# Patient Record
Sex: Female | Born: 1990
Health system: Southern US, Community
[De-identification: ages and names within clinical notes are randomized; demographics above are authoritative.]

## PROBLEM LIST (undated history)

## (undated) DIAGNOSIS — I10 Essential (primary) hypertension: Secondary | ICD-10-CM

## (undated) DIAGNOSIS — Z789 Other specified health status: Secondary | ICD-10-CM

## (undated) DIAGNOSIS — E282 Polycystic ovarian syndrome: Secondary | ICD-10-CM

## (undated) DIAGNOSIS — E119 Type 2 diabetes mellitus without complications: Secondary | ICD-10-CM

## (undated) HISTORY — PX: NO PAST SURGERIES: SHX2092

---
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
## 2012-05-15 ENCOUNTER — Encounter (HOSPITAL_COMMUNITY): Payer: Self-pay | Admitting: *Deleted

## 2012-05-15 ENCOUNTER — Inpatient Hospital Stay (HOSPITAL_COMMUNITY)
Admission: AD | Admit: 2012-05-15 | Discharge: 2012-05-15 | Disposition: A | Discharge status: Home / Self Care | Payer: Self-pay | Source: Ambulatory Visit | Attending: Obstetrics & Gynecology | Admitting: Obstetrics & Gynecology

## 2012-05-15 DIAGNOSIS — Z3202 Encounter for pregnancy test, result negative: Secondary | ICD-10-CM | POA: Insufficient documentation

## 2012-05-15 HISTORY — DX: Other specified health status: Z78.9

## 2012-05-15 NOTE — MAU Note (Signed)
Pt LMP 03/29/2012, neg UPT at home.  Denies bleeding, discharge or pain.

## 2012-05-15 NOTE — MAU Provider Note (Signed)
Chart reviewed and agree with management and plan.  

## 2012-05-15 NOTE — MAU Provider Note (Signed)
  History     CSN: [id number]  Arrival date and time: 05/15/12 0646   None     Chief Complaint  Patient presents with  . Possible Pregnancy   HPI This is a 23 y.o. female who presents with request for pregnancy test, even though she just had a period less than a month ago. States she has been trying to get pregnant for 2 months and wants to know why she is not pregnant yet.  Denies any physical problems.  Has not seen a family doctor or OB/GYN.  Denies any history of GYN problems.   RN Note: Pt LMP 03/29/2012, neg UPT at home. Denies bleeding, discharge or pain.       OB History   Grav Para Term Preterm Abortions TAB SAB Ect Mult Living   1    1  1    0      Past Medical History  Diagnosis Date  . Medical history non-contributory     Past Surgical History  Procedure Laterality Date  . No past surgeries      Family History  Problem Relation Age of Onset  . Hypertension Father   . Diabetes Other     History  Substance Use Topics  . Smoking status: Never Smoker   . Smokeless tobacco: Not on file  . Alcohol Use: No    Allergies:  Allergies  Allergen Reactions  . Penicillins Rash    rash    Prescriptions prior to admission  Medication Sig Dispense Refill  . ibuprofen (ADVIL,MOTRIN) 200 MG tablet Take 200 mg by mouth every 6 (six) hours as needed for pain (pain).        Review of Systems  Constitutional: Negative for fever, chills and malaise/fatigue.  Gastrointestinal: Negative for nausea, vomiting, abdominal pain, diarrhea and constipation.  Genitourinary: Negative for dysuria.  Musculoskeletal: Negative for myalgias.  Neurological: Negative for dizziness and headaches.  Psychiatric/Behavioral: The patient is nervous/anxious.    Physical Exam   Blood pressure 122/65, pulse 88, temperature 97.8 F (36.6 C), temperature source Oral, resp. rate 16, height 5\' 4"  (1.626 m), weight 236 lb 3.2 oz (107.14 kg), last menstrual period 03/29/2012.  Physical Exam   Constitutional: She is oriented to person, place, and time. She appears well-developed and well-nourished. No distress.  Cardiovascular: Normal rate.   Respiratory: Effort normal.  Genitourinary:  Pelvic exam not indicated, has no complaints  Musculoskeletal: Normal range of motion.  Neurological: She is alert and oriented to person, place, and time.  Skin: Skin is warm and dry.  Psychiatric: She has a normal mood and affect.    MAU Course  Procedures  MDM Discussed conception chances and definition of infertility. Recommend she seek advice if she goes longer than one year without conception. Discussed need for basic GYN exam and recommended she start PNV before conception and optimize her physical health. [facility] physical exam. Too young for pap yet.   Assessment and Plan  A:  Young female with desire for pregnancy       No conception after 2 months of trying  P:  Recommend starting prenatal vitamins      Very brief preconception counseling done.       Recommend GYN physical and good general health practices   [facility] 05/15/2012, 8:24 AM

## 2012-08-02 ENCOUNTER — Emergency Department (HOSPITAL_COMMUNITY): Payer: Self-pay

## 2012-08-02 ENCOUNTER — Emergency Department (HOSPITAL_COMMUNITY)
Admission: EM | Admit: 2012-08-02 | Discharge: 2012-08-03 | Disposition: A | Discharge status: Home / Self Care | Payer: Self-pay | Attending: Emergency Medicine | Admitting: Emergency Medicine

## 2012-08-02 DIAGNOSIS — R220 Localized swelling, mass and lump, head: Secondary | ICD-10-CM | POA: Insufficient documentation

## 2012-08-02 DIAGNOSIS — R51 Headache: Secondary | ICD-10-CM | POA: Insufficient documentation

## 2012-08-02 DIAGNOSIS — R509 Fever, unspecified: Secondary | ICD-10-CM | POA: Insufficient documentation

## 2012-08-02 DIAGNOSIS — R1310 Dysphagia, unspecified: Secondary | ICD-10-CM | POA: Insufficient documentation

## 2012-08-02 DIAGNOSIS — J069 Acute upper respiratory infection, unspecified: Secondary | ICD-10-CM | POA: Insufficient documentation

## 2012-08-02 LAB — CBC WITH DIFFERENTIAL/PLATELET
Basophils Absolute: 0 10*3/uL (ref 0.0–0.1)
HCT: 40.2 % (ref 36.0–46.0)
Lymphocytes Relative: 16 % (ref 12–46)
Neutro Abs: 6.5 10*3/uL (ref 1.7–7.7)
Platelets: 275 10*3/uL (ref 150–400)
RDW: 11.8 % (ref 11.5–15.5)
WBC: 10 10*3/uL (ref 4.0–10.5)

## 2012-08-02 LAB — RAPID STREP SCREEN (MED CTR MEBANE ONLY): Streptococcus, Group A Screen (Direct): NEGATIVE

## 2012-08-02 LAB — BASIC METABOLIC PANEL
CO2: 24 mEq/L (ref 19–32)
Chloride: 97 mEq/L (ref 96–112)
Sodium: 134 mEq/L — ABNORMAL LOW (ref 135–145)

## 2012-08-02 MED ORDER — ACETAMINOPHEN 325 MG PO TABS
650.0000 mg | ORAL_TABLET | Freq: Once | ORAL | Status: AC
  Administered 2012-08-02: 650 mg via ORAL
  Filled 2012-08-02: qty 2

## 2012-08-02 MED ORDER — SODIUM CHLORIDE 0.9 % IV BOLUS (SEPSIS)
1000.0000 mL | Freq: Once | INTRAVENOUS | Status: AC
  Administered 2012-08-02: 1000 mL via INTRAVENOUS

## 2012-08-02 MED ORDER — IOHEXOL 300 MG/ML  SOLN
100.0000 mL | Freq: Once | INTRAMUSCULAR | Status: AC | PRN
  Administered 2012-08-02: 100 mL via INTRAVENOUS

## 2012-08-02 NOTE — ED Provider Notes (Signed)
History     This chart was scribed for non-physician practitioner Junious Silk PA-C working with Richardean Canal, MD by Smitty Pluck, ED scribe. This patient was seen in room WTR8/WTR8 and the patient's care was started at 10:47 PM.    CSN: [id number]  Arrival date & time 08/02/12  2137      Chief Complaint  Patient presents with  . Sore Throat  . Fever     The history is provided by the patient and medical records. No language interpreter was used.   HPI Comments: Laura Ashley is a 22 y.o. female who presents to the Emergency Department complaining of constant,moderate sore throat onset 2 days ago. Pt reports having fever (current temperature is 102.8 in ED). She states that she has trouble opening her mouth and pain with swallowing. Pt reports having swelling to the left side of neck. She mentions having moderate, frontal HA that is aggravated by looking up. Pt has taken tylenol with minor relief of HA. She denies sick contact.  Pt denies chills, nausea, vomiting, diarrhea, weakness, cough, SOB and any other pain.    Past Medical History  Diagnosis Date  . Medical history non-contributory     Past Surgical History  Procedure Laterality Date  . No past surgeries      Family History  Problem Relation Age of Onset  . Hypertension Father   . Diabetes Other     History  Substance Use Topics  . Smoking status: Never Smoker   . Smokeless tobacco: Not on file  . Alcohol Use: No    OB History   Grav Para Term Preterm Abortions TAB SAB Ect Mult Living   1    1  1    0      Review of Systems  Constitutional: Positive for fever. Negative for chills.  HENT: Positive for sore throat.   Respiratory: Negative for shortness of breath.   Gastrointestinal: Negative for nausea and vomiting.  Neurological: Positive for headaches. Negative for weakness.  All other systems reviewed and are negative.    Allergies  Penicillins  Home Medications  No current outpatient  prescriptions on file.  BP 133/87  Pulse 129  Temp(Src) 102.8 F (39.3 C) (Oral)  Resp 16  SpO2 100%  Physical Exam  Nursing note and vitals reviewed. Constitutional: She is oriented to person, place, and time. She appears well-developed and well-nourished. No distress.  HENT:  Head: Normocephalic and atraumatic.  Right Ear: External ear normal.  Left Ear: External ear normal.  Nose: Nose normal.  Mouth/Throat: Oropharynx is clear and moist.  Tenderness to palpation in frontal and maxillary sinuses No submandibular edema  Mild trismus No tongue elevation   Eyes: Conjunctivae are normal.  Neck: Normal range of motion.  Tender lymphadenopathy in left submandibular   Cardiovascular: Normal rate, regular rhythm and normal heart sounds.   Pulmonary/Chest: Effort normal and breath sounds normal. No stridor. No respiratory distress. She has no wheezes. She has no rales.  Abdominal: Soft. She exhibits no distension.  Musculoskeletal: Normal range of motion.  Neurological: She is alert and oriented to person, place, and time. She has normal strength.  Skin: Skin is warm and dry. She is not diaphoretic. No erythema.  Psychiatric: She has a normal mood and affect. Her behavior is normal.    ED Course  Procedures (including critical care time) DIAGNOSTIC STUDIES: Oxygen Saturation is 100% on room air, normal by my interpretation.    COORDINATION OF CARE: 10:49 PM  Discussed ED treatment with pt and pt agrees.  Medications  sodium chloride 0.9 % bolus 1,000 mL (1,000 mLs Intravenous New Bag/Given 08/02/12 2324)  iohexol (OMNIPAQUE) 300 MG/ML solution 100 mL (not administered)  acetaminophen (TYLENOL) tablet 650 mg (650 mg Oral Given 08/02/12 2230)       Labs Reviewed  CBC WITH DIFFERENTIAL - Abnormal; Notable for the following:    MCHC 36.1 (*)    Monocytes Relative 19 (*)    Monocytes Absolute 1.9 (*)    All other components within normal limits  BASIC METABOLIC PANEL -  Abnormal; Notable for the following:    Sodium 134 (*)    All other components within normal limits  RAPID STREP SCREEN  CULTURE, GROUP A STREP   Ct Soft Tissue Neck W Contrast  08/03/2012   *RADIOLOGY REPORT*  Clinical Data: Neck pain.  Fever.  Left neck swelling and jaw pain.  CT NECK WITH CONTRAST  Technique:  Multidetector CT imaging of the neck was performed with intravenous contrast.  Contrast: OMNIPAQUE IOHEXOL 300 MG/ML  SOLN  Comparison: None.  Findings: Symmetric enlargement of the palatine tonsils is seen bilaterally.  No evidence of tonsillar or peritonsillar abscess.  Bilateral submandibular lymphadenopathy is seen, left side greater than right, with largest left submandibular node measuring 1.9 cm. Bilateral jugulodigastric lymphadenopathy is also seen, with largest lymph node on the left side measuring 1.8 cm.  Mild bilateral upper jugular chain lymphadenopathy also seen which is relatively symmetric. No necrotic lymphadenopathy is seen.  No soft tissue masses are seen involving the salivary glands or thyroid.  Larynx and epiglottis are normal in appearance.  IMPRESSION:  1.  Bilateral symmetric enlargement of palatine tonsils, consistent with tonsillitis.  No evidence of tonsillar or peritonsillar abscess. 2.  Bilateral submandibular, jugulodigastric, and upper jugular chain lymphadenopathy, likely reactive in etiology.   Original Report Authenticated By: Myles Rosenthal, M.D.     1. URI (upper respiratory infection)       MDM  Patient is a 22 year old female presents today with 2 days of worsening sore throat and fever. Fever as high as 103F. Patient tenderness to the left submandibular side. CT scan shows no evidence of peritonsillar abscess. She was given 1 L of normal saline in the ED. Heart rate was measured at 109 by me before discharge. Fever was reduced to 101.66F. Discussed with patient that this is viral in nature. Drink plenty of fluids, symptomatic therapy. Followup  with your primary care doctor. Return instructions given. Vital signs stable for discharge. Patient / Family / Caregiver informed of clinical course, understand medical decision-making process, and agree with plan.       I personally performed the services described in this documentation, which was scribed in my presence. The recorded information has been reviewed and is accurate.     Mora Bellman, PA-C 08/03/12 [phone]

## 2012-08-02 NOTE — ED Notes (Signed)
Pt c/o swelling to L side of neck with sore throat, headache and lightheaded for past 2 days. Worse now. Pt has obvious swelling to L side of neck. No acute distress. Breaths even/unlabored. Pt states her family drove her here. Pt ambulatory to exam room with steady gait.

## 2012-08-03 MED ORDER — FLUTICASONE PROPIONATE 50 MCG/ACT NA SUSP: 2.0000 | Freq: Every day | NASAL | Status: DC

## 2012-08-03 MED ORDER — ACETAMINOPHEN 500 MG PO TABS
1000.0000 mg | ORAL_TABLET | Freq: Once | ORAL | Status: AC
  Administered 2012-08-03: 1000 mg via ORAL
  Filled 2012-08-03: qty 2

## 2012-08-03 NOTE — ED Notes (Signed)
Pt ambulating independently w/ steady gait on d/c in no acute distress, A&Ox4. D/c instructions reviewed w/ pt and family - pt and family deny any further questions or concerns at present. Rx given x1  

## 2012-08-04 LAB — CULTURE, GROUP A STREP

## 2012-08-06 ENCOUNTER — Telehealth (HOSPITAL_COMMUNITY): Payer: Self-pay | Admitting: Emergency Medicine

## 2012-08-06 NOTE — ED Notes (Signed)
Post ED Visit - Positive Culture Follow-up  Culture report reviewed by antimicrobial stewardship pharmacist: []  Wes Dulaney, Pharm.D., BCPS [x]  Celedonio Miyamoto, Pharm.D., BCPS []  Georgina Pillion, Pharm.D., BCPS []  [place], [address].D., BCPS, AAHIVP []  Estella Husk, Pharm.D., BCPS, AAHIV  Positive throat culture No treatment needed.  Kylie A Holland 08/06/2012, 11:10 AM

## 2012-08-09 NOTE — ED Provider Notes (Signed)
Medical screening examination/treatment/procedure(s) were performed by non-physician practitioner and as supervising physician I was immediately available for consultation/collaboration.   Richardean Canal, MD 08/09/12 [phone]

## 2012-08-28 ENCOUNTER — Emergency Department (HOSPITAL_COMMUNITY)
Admission: EM | Admit: 2012-08-28 | Discharge: 2012-08-28 | Disposition: A | Discharge status: Home / Self Care | Payer: Self-pay | Attending: Emergency Medicine | Admitting: Emergency Medicine

## 2012-08-28 ENCOUNTER — Encounter (HOSPITAL_COMMUNITY): Payer: Self-pay | Admitting: Emergency Medicine

## 2012-08-28 DIAGNOSIS — R599 Enlarged lymph nodes, unspecified: Secondary | ICD-10-CM | POA: Insufficient documentation

## 2012-08-28 DIAGNOSIS — Z880 Allergy status to penicillin: Secondary | ICD-10-CM | POA: Insufficient documentation

## 2012-08-28 DIAGNOSIS — R590 Localized enlarged lymph nodes: Secondary | ICD-10-CM

## 2012-08-28 DIAGNOSIS — Z789 Other specified health status: Secondary | ICD-10-CM | POA: Insufficient documentation

## 2012-08-28 NOTE — ED Notes (Signed)
Pt reports being seen on 08/02/2012 for upper respiratory infection and swollen glands. Pt reports that her swollen glands has improved since last being seen. Pt also states she had left sided ear pain.

## 2012-08-28 NOTE — ED Provider Notes (Signed)
History  This chart was scribed for Hayleigh Bawa working with Ethelda Chick, MD by Ardelia Mems, ED Scribe. This patient was seen in room WTR7/WTR7 and the patient's care was started at 5:30 PM.   CSN: [id number]  Arrival date & time 08/28/12  1712    Chief Complaint  Patient presents with  . Adenopathy     The history is provided by the patient. No language interpreter was used.   HPI Comments: Laura Ashley is a 22 y.o. female who presents to the Emergency Department complaining of left-sided neck gland swelling. Pt states that she was diagnosed with tonsillitis 1 month ago. Pt had a CT of her neck on 08/03/12 and was found to have lymphadenopathy. Pt states that her left tonsil has remained swollen for a month despite antibiotics and steroids. Pt also complains of constant, mild right ear pain onset about a month ago. Pt states that turning her head left worsens pain. Pt denies fever, difficulty swallowing, or any other symptoms. Pt is a former occasional smoker.  PCP- none Past Medical History  Diagnosis Date  . Medical history non-contributory     Past Surgical History  Procedure Laterality Date  . No past surgeries      Family History  Problem Relation Age of Onset  . Hypertension Father   . Diabetes Other     History  Substance Use Topics  . Smoking status: Never Smoker   . Smokeless tobacco: Not on file  . Alcohol Use: No    OB History   Grav Para Term Preterm Abortions TAB SAB Ect Mult Living   1    1  1    0      Review of Systems As per HPI   Allergies  Penicillins  Home Medications   Current Outpatient Rx  Name  Route  Sig  Dispense  Refill  . fluticasone (FLONASE) 50 MCG/ACT nasal spray   Nasal   Place 2 sprays into the nose daily.   16 g   0     Triage Vitals: BP 134/82  Pulse 89  Temp(Src) 99 F (37.2 C) (Oral)  Resp 20  SpO2 100%  LMP 06/27/2012  Physical Exam  Constitutional: She is oriented to person, place, and time. She  appears well-developed and well-nourished. No distress.  HENT:  Head: Normocephalic and atraumatic.  Mouth/Throat: Oropharynx is clear and moist. No oropharyngeal exudate.  Eyes: Conjunctivae and EOM are normal. Pupils are equal, round, and reactive to light. No scleral icterus.  Neck: Normal range of motion. Neck supple. No tracheal deviation present. No thyromegaly present.  Cardiovascular: Normal rate, regular rhythm, normal heart sounds and intact distal pulses.   Pulmonary/Chest: Effort normal and breath sounds normal. No stridor. No respiratory distress. She has no wheezes.  Abdominal: Soft.  Musculoskeletal: Normal range of motion. She exhibits no edema and no tenderness.  Lymphadenopathy:    She has cervical adenopathy.  Neurological: She is alert and oriented to person, place, and time. Coordination normal.  Skin: Skin is warm and dry. No rash noted. She is not diaphoretic. No erythema. No pallor.  Psychiatric: She has a normal mood and affect. Her behavior is normal.    ED Course  Procedures (including critical care time)  DIAGNOSTIC STUDIES: Oxygen Saturation is 100% on RA, normal by my interpretation.    COORDINATION OF CARE: 5:34 PM- Pt advised of plan for treatment and pt agrees.     Labs Reviewed - No data to display No  results found.   1. Cervical lymphadenopathy       MDM  Non-toxic, non-septic appearing 22 y.o. Female presents to ED complaining of adenopathy. Pt asymptomatic, afebrile, with no other acute complaints. Discussed that this is likely due to her tonsillitis a couple weeks ago. Explained that this should resolve on its own and advised primary care follow up if it does not. Pt given affordable care act information and resource guide.  MDM Number of Diagnoses or Management Options        I personally performed the services described in this documentation, which was scribed in my presence. The recorded information has been reviewed and is  accurate.      Jaci Carrel, [place] 08/29/12 0104

## 2012-09-01 NOTE — ED Provider Notes (Signed)
Medical screening examination/treatment/procedure(s) were performed by non-physician practitioner and as supervising physician I was immediately available for consultation/collaboration.  Charletha Dalpe K Linker, MD 09/01/12 1518 

## 2012-11-10 ENCOUNTER — Encounter (HOSPITAL_COMMUNITY): Payer: Self-pay | Admitting: Emergency Medicine

## 2012-11-10 ENCOUNTER — Emergency Department (HOSPITAL_COMMUNITY)
Admission: EM | Admit: 2012-11-10 | Discharge: 2012-11-10 | Disposition: A | Discharge status: Home / Self Care | Payer: Self-pay | Attending: Emergency Medicine | Admitting: Emergency Medicine

## 2012-11-10 DIAGNOSIS — Z880 Allergy status to penicillin: Secondary | ICD-10-CM | POA: Insufficient documentation

## 2012-11-10 DIAGNOSIS — R3915 Urgency of urination: Secondary | ICD-10-CM | POA: Insufficient documentation

## 2012-11-10 DIAGNOSIS — Z3202 Encounter for pregnancy test, result negative: Secondary | ICD-10-CM | POA: Insufficient documentation

## 2012-11-10 DIAGNOSIS — Y9389 Activity, other specified: Secondary | ICD-10-CM | POA: Insufficient documentation

## 2012-11-10 DIAGNOSIS — S335XXA Sprain of ligaments of lumbar spine, initial encounter: Secondary | ICD-10-CM | POA: Insufficient documentation

## 2012-11-10 DIAGNOSIS — Z79899 Other long term (current) drug therapy: Secondary | ICD-10-CM | POA: Insufficient documentation

## 2012-11-10 DIAGNOSIS — R350 Frequency of micturition: Secondary | ICD-10-CM | POA: Insufficient documentation

## 2012-11-10 DIAGNOSIS — S39012A Strain of muscle, fascia and tendon of lower back, initial encounter: Secondary | ICD-10-CM

## 2012-11-10 DIAGNOSIS — N390 Urinary tract infection, site not specified: Secondary | ICD-10-CM | POA: Insufficient documentation

## 2012-11-10 DIAGNOSIS — Z792 Long term (current) use of antibiotics: Secondary | ICD-10-CM | POA: Insufficient documentation

## 2012-11-10 DIAGNOSIS — Y990 Civilian activity done for income or pay: Secondary | ICD-10-CM | POA: Insufficient documentation

## 2012-11-10 DIAGNOSIS — Y929 Unspecified place or not applicable: Secondary | ICD-10-CM | POA: Insufficient documentation

## 2012-11-10 DIAGNOSIS — X503XXA Overexertion from repetitive movements, initial encounter: Secondary | ICD-10-CM | POA: Insufficient documentation

## 2012-11-10 DIAGNOSIS — R109 Unspecified abdominal pain: Secondary | ICD-10-CM | POA: Insufficient documentation

## 2012-11-10 LAB — URINALYSIS, ROUTINE W REFLEX MICROSCOPIC
Ketones, ur: NEGATIVE mg/dL
Nitrite: NEGATIVE
Protein, ur: NEGATIVE mg/dL

## 2012-11-10 LAB — URINE MICROSCOPIC-ADD ON

## 2012-11-10 MED ORDER — IBUPROFEN 200 MG PO TABS
600.0000 mg | ORAL_TABLET | Freq: Once | ORAL | Status: AC
  Administered 2012-11-10: 600 mg via ORAL
  Filled 2012-11-10: qty 3

## 2012-11-10 MED ORDER — IBUPROFEN 600 MG PO TABS: 600.0000 mg | ORAL_TABLET | Freq: Four times a day (QID) | ORAL | Status: DC | PRN

## 2012-11-10 MED ORDER — METHOCARBAMOL 500 MG PO TABS: 500.0000 mg | ORAL_TABLET | Freq: Two times a day (BID) | ORAL | Status: DC

## 2012-11-10 MED ORDER — SULFAMETHOXAZOLE-TRIMETHOPRIM 800-160 MG PO TABS: 1.0000 | ORAL_TABLET | Freq: Two times a day (BID) | ORAL | Status: DC

## 2012-11-10 NOTE — Progress Notes (Signed)
[facility] provided pt with a list of primary care resources in [place].

## 2012-11-10 NOTE — ED Notes (Signed)
Pt reports having mid-lower 9/10 lower back pain that started last Saturday, which progressively has gotten worse. Pt also reports generalized lower abdominal pain. Pt reports nausea, however denies diarrhea or emesis. Pt reports her last period was two months ago, which was irregular. Pt is A/O x4 and in NAD.

## 2012-11-10 NOTE — ED Provider Notes (Signed)
CSN: [id number]     Arrival date & time 11/10/12  1115 History   First MD Initiated Contact with Patient 11/10/12 1118     Chief Complaint  Patient presents with  . Back Pain  . Abdominal Pain   (Consider location/radiation/quality/duration/timing/severity/associated sxs/prior Treatment) HPI Patient is a 22 year old female who presents with 6 days of mid thoracic and lumbar pain worse with movement. Patient states the pain radiates bilaterally to her abdomen. No radiation down her legs. No bladder or bowel incontinence. No weakness or numbness in her extremities. 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 No known trauma. No fevers or chills. Patient complains of urinary frequency and urgency. No hematuria. No nausea, vomiting, diarrhea or constipation. The patient denies rash bleeding or discharge. States she last had her period in June. She is sexually active. Past Medical History  Diagnosis Date  . Medical history non-contributory    Past Surgical History  Procedure Laterality Date  . No past surgeries     Family History  Problem Relation Age of Onset  . Hypertension Father   . Diabetes Other    History  Substance Use Topics  . Smoking status: Never Smoker   . Smokeless tobacco: Never Used  . Alcohol Use: No   OB History   Grav Para Term Preterm Abortions TAB SAB Ect Mult Living   1    1  1    0     Review of Systems  Constitutional: Negative for fever and chills.  HENT: Negative for neck pain.   Respiratory: Negative for cough and shortness of breath.   Cardiovascular: Negative for chest pain, palpitations and leg swelling.  Gastrointestinal: Positive for abdominal pain. Negative for nausea, vomiting, diarrhea, constipation and blood in stool.  Genitourinary: Positive for frequency. Negative for dysuria, hematuria, flank pain, vaginal bleeding, vaginal discharge, difficulty urinating and pelvic pain.  Musculoskeletal: Positive for myalgias and  back pain. Negative for arthralgias.  Skin: Negative for rash and wound.  Neurological: Negative for dizziness, weakness, light-headedness, numbness and headaches.  All other systems reviewed and are negative.    Allergies  Penicillins  Home Medications   Current Outpatient Rx  Name  Route  Sig  Dispense  Refill  . ibuprofen (ADVIL,MOTRIN) 600 MG tablet   Oral   Take 1 tablet (600 mg total) by mouth every 6 (six) hours as needed for pain.   30 tablet   0   . methocarbamol (ROBAXIN) 500 MG tablet   Oral   Take 1 tablet (500 mg total) by mouth 2 (two) times daily.   20 tablet   0   . sulfamethoxazole-trimethoprim (SEPTRA DS) 800-160 MG per tablet   Oral   Take 1 tablet by mouth 2 (two) times daily.   10 tablet   0    BP 152/92  Pulse 83  Temp(Src) 98.9 F (37.2 C) (Oral)  Resp 20  SpO2 100%  LMP 10/10/2012 Physical Exam  Nursing note and vitals reviewed. Constitutional: She is oriented to person, place, and time. She appears well-developed and well-nourished. No distress.  HENT:  Head: Normocephalic and atraumatic.  Mouth/Throat: Oropharynx is clear and moist.  Eyes: EOM are normal. Pupils are equal, round, and reactive to light.  Neck: Normal range of motion. Neck supple.  Cardiovascular: Normal rate and regular rhythm.   Pulmonary/Chest: Effort normal and breath sounds normal. No respiratory distress. She has no wheezes. She has no rales.  Abdominal: Soft.  Bowel sounds are normal.  Musculoskeletal: Normal range of motion. She exhibits no edema and no tenderness.  No CVA tenderness. Patient has low thoracic and lumbar paraspinal tenderness bilaterally. No midline thoracic or lumbar tenderness. No step off or deformities. Patient has negative straight leg raise bilaterally. her distal pulses are intact.  Neurological: She is alert and oriented to person, place, and time.  Patient is alert and oriented x3 with clear, goal oriented speech. Patient has 5/5 motor in  all extremities. Sensation is intact to light touch. Patient has a normal gait and walks without assistance.   Skin: Skin is warm and dry. No rash noted. No erythema.  Psychiatric: She has a normal mood and affect. Her behavior is normal.    ED Course  Procedures (including critical care time) Labs Review Labs Reviewed  URINALYSIS, ROUTINE W REFLEX MICROSCOPIC - Abnormal; Notable for the following:    APPearance CLOUDY (*)    Hgb urine dipstick MODERATE (*)    Leukocytes, UA LARGE (*)    All other components within normal limits  URINE MICROSCOPIC-ADD ON - Abnormal; Notable for the following:    Squamous Epithelial / LPF FEW (*)    Bacteria, UA MANY (*)    All other components within normal limits  URINE CULTURE  POCT PREGNANCY, URINE   Imaging Review No results found.  MDM   Given normal heart rate, lack of fever, no costovertebral angle tenderness, but do not believe the patient has pyelonephritis. The patient likely has lumbar strain and a urinary tract infection.  Return Precautions given.  Loren Racer, MD 11/10/12 1227

## 2012-11-12 LAB — URINE CULTURE: Colony Count: >=100000

## 2012-11-13 NOTE — ED Notes (Signed)
+   Urine Treated with Bactrim -Appropriate treatment per protocol MD.

## 2012-11-16 ENCOUNTER — Encounter (HOSPITAL_COMMUNITY): Payer: Self-pay | Admitting: Emergency Medicine

## 2012-11-16 ENCOUNTER — Emergency Department (HOSPITAL_COMMUNITY)
Admission: EM | Admit: 2012-11-16 | Discharge: 2012-11-17 | Disposition: A | Discharge status: Home / Self Care | Payer: Self-pay | Attending: Emergency Medicine | Admitting: Emergency Medicine

## 2012-11-16 DIAGNOSIS — Z3202 Encounter for pregnancy test, result negative: Secondary | ICD-10-CM | POA: Insufficient documentation

## 2012-11-16 DIAGNOSIS — Z79899 Other long term (current) drug therapy: Secondary | ICD-10-CM | POA: Insufficient documentation

## 2012-11-16 DIAGNOSIS — R109 Unspecified abdominal pain: Secondary | ICD-10-CM | POA: Insufficient documentation

## 2012-11-16 DIAGNOSIS — Z880 Allergy status to penicillin: Secondary | ICD-10-CM | POA: Insufficient documentation

## 2012-11-16 DIAGNOSIS — Z87440 Personal history of urinary (tract) infections: Secondary | ICD-10-CM | POA: Insufficient documentation

## 2012-11-16 LAB — COMPREHENSIVE METABOLIC PANEL
Albumin: 3.7 g/dL (ref 3.5–5.2)
Alkaline Phosphatase: 64 U/L (ref 39–117)
BUN: 10 mg/dL (ref 6–23)
CO2: 25 mEq/L (ref 19–32)
Chloride: 105 mEq/L (ref 96–112)
GFR calc Af Amer: >90 mL/min (ref >90)
GFR calc non Af Amer: >90 mL/min (ref >90)
Glucose, Bld: 100 mg/dL — ABNORMAL HIGH (ref 70–99)
Potassium: 3.9 mEq/L (ref 3.5–5.1)
Total Bilirubin: 0.3 mg/dL (ref 0.3–1.2)

## 2012-11-16 LAB — CBC WITH DIFFERENTIAL/PLATELET
HCT: 40.3 % (ref 36.0–46.0)
Hemoglobin: 14.2 g/dL (ref 12.0–15.0)
Lymphocytes Relative: 40 % (ref 12–46)
Lymphs Abs: 2.6 10*3/uL (ref 0.7–4.0)
Monocytes Relative: 12 % (ref 3–12)
Neutro Abs: 2.9 10*3/uL (ref 1.7–7.7)
Neutrophils Relative %: 45 % (ref 43–77)
RBC: 4.66 MIL/uL (ref 3.87–5.11)

## 2012-11-16 LAB — URINALYSIS, ROUTINE W REFLEX MICROSCOPIC
Bilirubin Urine: NEGATIVE
Ketones, ur: NEGATIVE mg/dL
Nitrite: NEGATIVE
pH: 6 (ref 5.0–8.0)

## 2012-11-16 LAB — LIPASE, BLOOD: Lipase: 24 U/L (ref 11–59)

## 2012-11-16 NOTE — ED Notes (Signed)
Pt was seen here for back pain, given ABX for UTI, pt now stating cramping abdominal pain after beginning ABX. Pt reports nausea, and diarrhea, no vomiting.

## 2012-11-17 NOTE — ED Provider Notes (Signed)
CSN: [id number]     Arrival date & time 11/16/12  2218 History   First MD Initiated Contact with Patient 11/17/12 0030     Chief Complaint  Patient presents with  . Abdominal Pain   (Consider location/radiation/quality/duration/timing/severity/associated sxs/prior Treatment) HPI Comments: Patient presents to the emergency department with chief complaints of abdominal pain. She states that she was seen recently and treated for UTI. She states that she has had some abdominal cramping following taking her antibiotics. She states that she is mostly curious to note her labs are better. She states that she has had some nausea, but denies diarrhea, constipation, or vomiting. She denies fevers or chills. She states that she is no longer having any abdominal discomfort, and asks to go home.  The history is provided by the patient. No language interpreter was used.    Past Medical History  Diagnosis Date  . Medical history non-contributory    Past Surgical History  Procedure Laterality Date  . No past surgeries     Family History  Problem Relation Age of Onset  . Hypertension Father   . Diabetes Other    History  Substance Use Topics  . Smoking status: Never Smoker   . Smokeless tobacco: Never Used  . Alcohol Use: No   OB History   Grav Para Term Preterm Abortions TAB SAB Ect Mult Living   1    1  1    0     Review of Systems  All other systems reviewed and are negative.    Allergies  Penicillins  Home Medications   Current Outpatient Rx  Name  Route  Sig  Dispense  Refill  . ibuprofen (ADVIL,MOTRIN) 600 MG tablet   Oral   Take 1 tablet (600 mg total) by mouth every 6 (six) hours as needed for pain.   30 tablet   0   . sulfamethoxazole-trimethoprim (SEPTRA DS) 800-160 MG per tablet   Oral   Take 1 tablet by mouth 2 (two) times daily.   10 tablet   0   . methocarbamol (ROBAXIN) 500 MG tablet   Oral   Take 1 tablet (500 mg total) by mouth 2 (two) times daily.   20  tablet   0    BP 146/84  Pulse 86  Temp(Src) 98.8 F (37.1 C) (Oral)  Resp 18  SpO2 99%  LMP 10/10/2012 Physical Exam  Nursing note and vitals reviewed. Constitutional: She is oriented to person, place, and time. She appears well-developed and well-nourished.  HENT:  Head: Normocephalic and atraumatic.  Eyes: Conjunctivae and EOM are normal. Pupils are equal, round, and reactive to light.  Neck: Normal range of motion. Neck supple.  Cardiovascular: Normal rate and regular rhythm.  Exam reveals no gallop and no friction rub.   No murmur heard. Pulmonary/Chest: Effort normal and breath sounds normal. No respiratory distress. She has no wheezes. She has no rales. She exhibits no tenderness.  Abdominal: Soft. Bowel sounds are normal. She exhibits no distension and no mass. There is no tenderness. There is no rebound and no guarding.  No focal abdominal tenderness, no fluid wave, no signs of peritonitis, no signs of surgical abdomen  Musculoskeletal: Normal range of motion. She exhibits no edema and no tenderness.  Neurological: She is alert and oriented to person, place, and time.  Skin: Skin is warm and dry.  Psychiatric: She has a normal mood and affect. Her behavior is normal. Judgment and thought content normal.    ED  Course  Procedures (including critical care time) Labs Review Labs Reviewed  COMPREHENSIVE METABOLIC PANEL - Abnormal; Notable for the following:    Glucose, Bld 100 (*)    All other components within normal limits  URINALYSIS, ROUTINE W REFLEX MICROSCOPIC - Abnormal; Notable for the following:    APPearance CLOUDY (*)    Specific Gravity, Urine 1.032 (*)    All other components within normal limits  CBC WITH DIFFERENTIAL  LIPASE, BLOOD  POCT PREGNANCY, URINE   Results for orders placed during the hospital encounter of 11/16/12  CBC WITH DIFFERENTIAL      Result Value Range   WBC 6.5  4.0 - 10.5 K/uL   RBC 4.66  3.87 - 5.11 MIL/uL   Hemoglobin 14.2  12.0  - 15.0 g/dL   HCT 41.3  24.4 - 01.0 %   MCV 86.5  78.0 - 100.0 fL   MCH 30.5  26.0 - 34.0 pg   MCHC 35.2  30.0 - 36.0 g/dL   RDW 27.2  53.6 - 64.4 %   Platelets 357  150 - 400 K/uL   Neutrophils Relative % 45  43 - 77 %   Neutro Abs 2.9  1.7 - 7.7 K/uL   Lymphocytes Relative 40  12 - 46 %   Lymphs Abs 2.6  0.7 - 4.0 K/uL   Monocytes Relative 12  3 - 12 %   Monocytes Absolute 0.8  0.1 - 1.0 K/uL   Eosinophils Relative 3  0 - 5 %   Eosinophils Absolute 0.2  0.0 - 0.7 K/uL   Basophils Relative 1  0 - 1 %   Basophils Absolute 0.0  0.0 - 0.1 K/uL  COMPREHENSIVE METABOLIC PANEL      Result Value Range   Sodium 141  135 - 145 mEq/L   Potassium 3.9  3.5 - 5.1 mEq/L   Chloride 105  96 - 112 mEq/L   CO2 25  19 - 32 mEq/L   Glucose, Bld 100 (*) 70 - 99 mg/dL   BUN 10  6 - 23 mg/dL   Creatinine, Ser 0.34  0.50 - 1.10 mg/dL   Calcium 9.6  8.4 - 74.2 mg/dL   Total Protein 7.5  6.0 - 8.3 g/dL   Albumin 3.7  3.5 - 5.2 g/dL   AST 21  0 - 37 U/L   ALT 20  0 - 35 U/L   Alkaline Phosphatase 64  39 - 117 U/L   Total Bilirubin 0.3  0.3 - 1.2 mg/dL   GFR calc non Af Amer >90  >90 mL/min   GFR calc Af Amer >90  >90 mL/min  LIPASE, BLOOD      Result Value Range   Lipase 24  11 - 59 U/L  URINALYSIS, ROUTINE W REFLEX MICROSCOPIC      Result Value Range   Color, Urine YELLOW  YELLOW   APPearance CLOUDY (*) CLEAR   Specific Gravity, Urine 1.032 (*) 1.005 - 1.030   pH 6.0  5.0 - 8.0   Glucose, UA NEGATIVE  NEGATIVE mg/dL   Hgb urine dipstick NEGATIVE  NEGATIVE   Bilirubin Urine NEGATIVE  NEGATIVE   Ketones, ur NEGATIVE  NEGATIVE mg/dL   Protein, ur NEGATIVE  NEGATIVE mg/dL   Urobilinogen, UA 1.0  0.0 - 1.0 mg/dL   Nitrite NEGATIVE  NEGATIVE   Leukocytes, UA NEGATIVE  NEGATIVE  POCT PREGNANCY, URINE      Result Value Range   Preg Test, Ur NEGATIVE  NEGATIVE   No results found.    MDM   1. Abdominal  pain, other specified site    Patient looks very well. She is mostly concerned about  knowing if her UTI has resolved. She states that she thinks that her antibiotics and made her stomach sick. She states that she is feeling much better now. She is not complaining of anything at this time. She is requesting something to drink, and wants to be discharged. I've given specific return precautions. Do not feel that any further workup is indicated at this time. Patient understands and agrees to plan. She is stable and ready for discharge.    Roxy Horseman, PA-C 11/17/12 0111

## 2012-11-20 NOTE — ED Provider Notes (Signed)
Medical screening examination/treatment/procedure(s) were performed by non-physician practitioner and as supervising physician I was immediately available for consultation/collaboration.   Kiera Hussey Joseph Apphia Cropley, MD 11/20/12 0736 

## 2013-03-10 ENCOUNTER — Inpatient Hospital Stay (HOSPITAL_COMMUNITY)
Admission: AD | Admit: 2013-03-10 | Discharge: 2013-03-10 | Disposition: A | Discharge status: Home / Self Care | Payer: Self-pay | Source: Ambulatory Visit | Attending: Obstetrics & Gynecology | Admitting: Obstetrics & Gynecology

## 2013-03-10 ENCOUNTER — Encounter (HOSPITAL_COMMUNITY): Payer: Self-pay | Admitting: *Deleted

## 2013-03-10 DIAGNOSIS — Z3202 Encounter for pregnancy test, result negative: Secondary | ICD-10-CM | POA: Insufficient documentation

## 2013-03-10 DIAGNOSIS — R1011 Right upper quadrant pain: Secondary | ICD-10-CM | POA: Insufficient documentation

## 2013-03-10 LAB — CBC
MCH: 30.5 pg (ref 26.0–34.0)
MCHC: 35.6 g/dL (ref 30.0–36.0)
Platelets: 362 10*3/uL (ref 150–400)
RBC: 4.36 MIL/uL (ref 3.87–5.11)
RDW: 11.9 % (ref 11.5–15.5)

## 2013-03-10 LAB — URINALYSIS, ROUTINE W REFLEX MICROSCOPIC
Bilirubin Urine: NEGATIVE
Glucose, UA: NEGATIVE mg/dL
Ketones, ur: NEGATIVE mg/dL
Leukocytes, UA: NEGATIVE
Nitrite: NEGATIVE
Protein, ur: NEGATIVE mg/dL
Specific Gravity, Urine: 1.015 (ref 1.005–1.030)
Urobilinogen, UA: 0.2 mg/dL (ref 0.0–1.0)
pH: 7 (ref 5.0–8.0)

## 2013-03-10 LAB — POCT PREGNANCY, URINE: Preg Test, Ur: NEGATIVE

## 2013-03-10 LAB — URINE MICROSCOPIC-ADD ON

## 2013-03-10 NOTE — Progress Notes (Signed)
Jennifer Rasch NP in earlier to discuss test results and d/c plan. Written and verbal d/c instructions given and understanding voiced. 

## 2013-03-10 NOTE — MAU Provider Note (Signed)
History     CSN: [id number]  Arrival date and time: 03/10/13 1708   First Provider Initiated Contact with Patient 03/10/13 1909      Chief Complaint  Patient presents with  . Abdominal Pain   HPI  [name] is a 22 y.o. female G1P0010 who presents for pregnancy confirmation and abdominal pain. She had a positive pregnancy test at home two weeks ago and a second positive test yesterday. She started experiencing abdominal pain today and that brought her to the ER.  The pain is on the right side of her upper abdomen.  The pain came on all of a sudden. She currently denies pain and rates it 0/10. Pregnancy test in MAU was negative; beta hcg pending.    OB History   Grav Para Term Preterm Abortions TAB SAB Ect Mult Living   1    1  1    0      Past Medical History  Diagnosis Date  . Medical history non-contributory     Past Surgical History  Procedure Laterality Date  . No past surgeries      Family History  Problem Relation Age of Onset  . Hypertension Father   . Diabetes Other     History  Substance Use Topics  . Smoking status: Never Smoker   . Smokeless tobacco: Never Used  . Alcohol Use: No    Allergies:  Allergies  Allergen Reactions  . Penicillins Rash    rash    No prescriptions prior to admission   Results for orders placed during the hospital encounter of 03/10/13 (from the past 24 hour(s))  URINALYSIS, ROUTINE W REFLEX MICROSCOPIC     Status: Abnormal   Collection Time    03/10/13  5:26 PM      Result Value Range   Color, Urine YELLOW  YELLOW   APPearance CLEAR  CLEAR   Specific Gravity, Urine 1.015  1.005 - 1.030   pH 7.0  5.0 - 8.0   Glucose, UA NEGATIVE  NEGATIVE mg/dL   Hgb urine dipstick LARGE (*) NEGATIVE   Bilirubin Urine NEGATIVE  NEGATIVE   Ketones, ur NEGATIVE  NEGATIVE mg/dL   Protein, ur NEGATIVE  NEGATIVE mg/dL   Urobilinogen, UA 0.2  0.0 - 1.0 mg/dL   Nitrite NEGATIVE  NEGATIVE   Leukocytes, UA NEGATIVE  NEGATIVE   URINE MICROSCOPIC-ADD ON     Status: Abnormal   Collection Time    03/10/13  5:26 PM      Result Value Range   Squamous Epithelial / LPF FEW (*) RARE   WBC, UA 0-2  <3 WBC/hpf   RBC / HPF 3-6  <3 RBC/hpf   Bacteria, UA MANY (*) RARE  POCT PREGNANCY, URINE     Status: None   Collection Time    03/10/13  5:38 PM      Result Value Range   Preg Test, Ur NEGATIVE  NEGATIVE  CBC     Status: None   Collection Time    03/10/13  6:23 PM      Result Value Range   WBC 5.9  4.0 - 10.5 K/uL   RBC 4.36  3.87 - 5.11 MIL/uL   Hemoglobin 13.3  12.0 - 15.0 g/dL   HCT 40.9  81.1 - 91.4 %   MCV 85.8  78.0 - 100.0 fL   MCH 30.5  26.0 - 34.0 pg   MCHC 35.6  30.0 - 36.0 g/dL   RDW 78.2  95.6 -  15.5 %   Platelets 362  150 - 400 K/uL  HCG, QUANTITATIVE, PREGNANCY     Status: None   Collection Time    03/10/13  6:38 PM      Result Value Range   hCG, Beta Chain, Quant, S <1  <5 mIU/mL     Review of Systems  Constitutional: Negative for fever and chills.  Gastrointestinal: Negative for nausea, vomiting, abdominal pain, diarrhea and constipation.       Pt had right upper quadrant pain earlier today; currently none  Genitourinary: Negative for dysuria, urgency, frequency and hematuria.       No vaginal discharge. No vaginal bleeding. No dysuria.    Physical Exam   Blood pressure 130/69, pulse 87, temperature 98.7 F (37.1 C), temperature source Oral, resp. rate 18, height 5\' 3"  (1.6 m), weight 124.002 kg (273 lb 6 oz), last menstrual period 01/19/2013.  Physical Exam  Constitutional: She is oriented to person, place, and time. She appears well-developed and well-nourished. No distress.  HENT:  Head: Normocephalic.  Eyes: Pupils are equal, round, and reactive to light.  Neck: Neck supple.  Respiratory: Effort normal.  GI: Soft. She exhibits no distension and no mass. There is no tenderness. There is no rebound and no guarding.  Musculoskeletal: Normal range of motion.  Neurological: She  is alert and oriented to person, place, and time.  Skin: Skin is warm. She is not diaphoretic.  Psychiatric: Her behavior is normal.    MAU Course  Procedures None  MDM UA UPT- negative Beta hcg Cbc  Discussed Beta hcg results with the patient; encouraged her to seek care at [facility] or [facility] ED if pain came back.  Assessment and Plan   A:  1. Encounter for pregnancy test with result negative   2. Abdominal pain, acute, right upper quadrant- resolved upon arrival     P: Discharge home Go to [facility] or [place] long as needed, if patient returns Support given   Iona Hansen Rasch, NP  03/10/2013, 7:11 PM

## 2013-03-10 NOTE — MAU Note (Signed)
Pt states here for abd cramping that began today. Had +upt at home yesterday and two weeks prior. Having pain on right side of lower abdomen only since this am. Denies uti s/s. Denies abnormal vaginal discharge.

## 2013-03-13 NOTE — MAU Provider Note (Signed)
Attestation of Attending Supervision of Advanced Practitioner (CNM/NP): Evaluation and management procedures were performed by the Advanced Practitioner under my supervision and collaboration. I have reviewed the Advanced Practitioner's note and chart, and I agree with the management and plan.  Nelline Lio H. 2:18 PM

## 2013-11-16 ENCOUNTER — Encounter (HOSPITAL_COMMUNITY): Payer: Self-pay | Admitting: General Practice

## 2013-11-16 ENCOUNTER — Inpatient Hospital Stay (HOSPITAL_COMMUNITY)
Admission: AD | Admit: 2013-11-16 | Discharge: 2013-11-16 | Disposition: A | Discharge status: Home / Self Care | Payer: Medicaid Other | Source: Ambulatory Visit | Attending: Family Medicine | Admitting: Family Medicine

## 2013-11-16 DIAGNOSIS — A499 Bacterial infection, unspecified: Secondary | ICD-10-CM | POA: Insufficient documentation

## 2013-11-16 DIAGNOSIS — N760 Acute vaginitis: Secondary | ICD-10-CM | POA: Insufficient documentation

## 2013-11-16 DIAGNOSIS — Z3202 Encounter for pregnancy test, result negative: Secondary | ICD-10-CM

## 2013-11-16 DIAGNOSIS — B9689 Other specified bacterial agents as the cause of diseases classified elsewhere: Secondary | ICD-10-CM | POA: Insufficient documentation

## 2013-11-16 DIAGNOSIS — N926 Irregular menstruation, unspecified: Secondary | ICD-10-CM

## 2013-11-16 LAB — URINALYSIS, ROUTINE W REFLEX MICROSCOPIC
BILIRUBIN URINE: NEGATIVE
GLUCOSE, UA: NEGATIVE mg/dL
Ketones, ur: 15 mg/dL — AB
Leukocytes, UA: NEGATIVE
Nitrite: NEGATIVE
PH: 6 (ref 5.0–8.0)
Protein, ur: NEGATIVE mg/dL
SPECIFIC GRAVITY, URINE: 1.02 (ref 1.005–1.030)
UROBILINOGEN UA: 0.2 mg/dL (ref 0.0–1.0)

## 2013-11-16 LAB — WET PREP, GENITAL
TRICH WET PREP: NONE SEEN
WBC WET PREP: NONE SEEN
YEAST WET PREP: NONE SEEN

## 2013-11-16 LAB — URINE MICROSCOPIC-ADD ON

## 2013-11-16 LAB — POCT PREGNANCY, URINE: PREG TEST UR: NEGATIVE

## 2013-11-16 MED ORDER — METRONIDAZOLE 500 MG PO TABS: 500.0000 mg | ORAL_TABLET | Freq: Two times a day (BID) | ORAL | Status: DC

## 2013-11-16 NOTE — Discharge Instructions (Signed)

## 2013-11-16 NOTE — MAU Provider Note (Signed)
History     CSN: [id number]  Arrival date and time: 11/16/13 1602   First Provider Initiated Contact with Patient 11/16/13 1647      Chief Complaint  Patient presents with  . Possible Pregnancy  . Vaginal Bleeding   HPI Laura Ashley is 23 y.o. G1P0010 presents for irregular periods.  Menarche at age 55, periods were regular until age 87.  Had miscarriage early in pregnancy at age 26.  Period in July lasted 3 days , stopped for 2 days and has been bleeding daily since then.  Changed tampon X 3 today.  Has abdominal pain only at night.  She does not take anything for the pain.  She and her boyfriend have been trying to conceive X 4 months.    Past Medical History  Diagnosis Date  . Medical history non-contributory     Past Surgical History  Procedure Laterality Date  . No past surgeries      Family History  Problem Relation Age of Onset  . Hypertension Father   . Diabetes Other     History  Substance Use Topics  . Smoking status: Never Smoker   . Smokeless tobacco: Never Used  . Alcohol Use: No    Allergies:  Allergies  Allergen Reactions  . Penicillins Rash    rash    No prescriptions prior to admission    Review of Systems  Constitutional: Negative for fever and chills.  Gastrointestinal: Positive for nausea. Negative for vomiting and abdominal pain.  Genitourinary: Negative for dysuria, urgency, frequency, hematuria and flank pain.       Irregular bleeding   Physical Exam   Blood pressure 162/93, pulse 77, temperature 98.9 F (37.2 C), temperature source Oral, resp. rate 18, height  (1.626 m), weight 278 lb (126.1 kg), SpO2 98.00%.  Physical Exam  Constitutional: She is oriented to person, place, and time. She appears well-developed and well-nourished. No distress.  HENT:  Head: Normocephalic.  Neck: Normal range of motion.  Cardiovascular: Normal rate.   Respiratory: Effort normal.  GI: Soft. She exhibits no distension and no mass. There is  no tenderness. There is no rebound and no guarding.  Genitourinary: There is no rash, tenderness or lesion on the right labia. There is no rash, tenderness or lesion on the left labia. Uterus is tender (slightly tender). Uterus is not enlarged. Cervix exhibits no motion tenderness, no discharge and no friability. Right adnexum displays no mass, no tenderness and no fullness. Left adnexum displays no mass, no tenderness and no fullness. No erythema (scant amount of dark red bleeding without clot) or tenderness around the vagina. Bleeding: small amount of dark red bleeding without clot. No foreign body around the vagina. No vaginal discharge found.  Neurological: She is alert and oriented to person, place, and time.  Skin: Skin is warm and dry.  Psychiatric: She has a normal mood and affect. Her behavior is normal.    Results for orders placed during the hospital encounter of 11/16/13 (from the past 24 hour(s))  URINALYSIS, ROUTINE W REFLEX MICROSCOPIC     Status: Abnormal   Collection Time    11/16/13  4:20 PM      Result Value Ref Range   Color, Urine YELLOW  YELLOW   APPearance CLEAR  CLEAR   Specific Gravity, Urine 1.020  1.005 - 1.030   pH 6.0  5.0 - 8.0   Glucose, UA NEGATIVE  NEGATIVE mg/dL   Hgb urine dipstick MODERATE (*) NEGATIVE  Bilirubin Urine NEGATIVE  NEGATIVE   Ketones, ur 15 (*) NEGATIVE mg/dL   Protein, ur NEGATIVE  NEGATIVE mg/dL   Urobilinogen, UA 0.2  0.0 - 1.0 mg/dL   Nitrite NEGATIVE  NEGATIVE   Leukocytes, UA NEGATIVE  NEGATIVE  URINE MICROSCOPIC-ADD ON     Status: Abnormal   Collection Time    11/16/13  4:20 PM      Result Value Ref Range   Squamous Epithelial / LPF FEW (*) RARE   WBC, UA 0-2  <3 WBC/hpf   RBC / HPF 0-2  <3 RBC/hpf  POCT PREGNANCY, URINE     Status: None   Collection Time    11/16/13  4:46 PM      Result Value Ref Range   Preg Test, Ur NEGATIVE  NEGATIVE  WET PREP, GENITAL     Status: Abnormal   Collection Time    11/16/13  5:00 PM       Result Value Ref Range   Yeast Wet Prep HPF POC NONE SEEN  NONE SEEN   Trich, Wet Prep NONE SEEN  NONE SEEN   Clue Cells Wet Prep HPF POC FEW (*) NONE SEEN   WBC, Wet Prep HPF POC NONE SEEN  NONE SEEN   MAU Course  Procedures  GC/CHL culture to lab  MDM Discussed "few" clue cells on wet prep.  Patient would like to be treated.    Assessment and Plan  A:  Irregular menstrual cycles      Bacterial vaginosis  P:  Discussed that she should continue to try to conceive and that infertility is not dx until 1 year (at her age) without conception     She states she has never had GYN exam or pap-encouraged her to call MD of her choice to establish GYN care      Rx for Flagyl to pharmacy--ETOH warning, pelvic rest X 1 week of treatment  Patrich Heinze,EVE M 11/16/2013, 5:39 PM

## 2013-11-16 NOTE — MAU Note (Signed)
Patient states she has had vaginal bleeding for at least 2 months. Had been having irregular periods before that time. Bleeding every day a lot and passed some clots last night. Has pain off and on but none now.

## 2013-11-17 LAB — GC/CHLAMYDIA PROBE AMP
CT PROBE, AMP APTIMA: NEGATIVE
GC Probe RNA: NEGATIVE

## 2013-11-20 NOTE — MAU Provider Note (Signed)
Attestation of Attending Supervision of Advanced Practitioner (PA/CNM/NP): Evaluation and management procedures were performed by the Advanced Practitioner under my supervision and collaboration.  I have reviewed the Advanced Practitioner's note and chart, and I agree with the management and plan.  Kaliann Coryell, DO Attending Physician Faculty Practice, [facility]   

## 2014-01-03 ENCOUNTER — Encounter: Payer: Medicaid Other | Admitting: Obstetrics & Gynecology

## 2014-01-14 ENCOUNTER — Encounter (HOSPITAL_COMMUNITY): Payer: Self-pay | Admitting: General Practice

## 2014-02-28 ENCOUNTER — Encounter: Payer: Self-pay | Admitting: Family Medicine

## 2014-02-28 ENCOUNTER — Ambulatory Visit (INDEPENDENT_AMBULATORY_CARE_PROVIDER_SITE_OTHER): Payer: Self-pay | Admitting: Family Medicine

## 2014-02-28 VITALS — BP 150/84 | HR 80 | Ht 64.0 in | Wt 283.0 lb

## 2014-02-28 DIAGNOSIS — N938 Other specified abnormal uterine and vaginal bleeding: Secondary | ICD-10-CM

## 2014-02-28 DIAGNOSIS — I10 Essential (primary) hypertension: Secondary | ICD-10-CM | POA: Insufficient documentation

## 2014-02-28 LAB — CBC
HCT: 39.5 % (ref 36.0–46.0)
Hemoglobin: 13.7 g/dL (ref 12.0–15.0)
MCH: 29.8 pg (ref 26.0–34.0)
MCHC: 34.7 g/dL (ref 30.0–36.0)
MCV: 86.1 fL (ref 78.0–100.0)
MPV: 9.6 fL (ref 9.4–12.4)
PLATELETS: 398 10*3/uL (ref 150–400)
RBC: 4.59 MIL/uL (ref 3.87–5.11)
RDW: 12.8 % (ref 11.5–15.5)
WBC: 6.7 10*3/uL (ref 4.0–10.5)

## 2014-02-28 MED ORDER — HYDROCHLOROTHIAZIDE 25 MG PO TABS: 25.0000 mg | ORAL_TABLET | Freq: Every day | ORAL | Status: DC

## 2014-02-28 NOTE — Progress Notes (Signed)
   Subjective:    Patient ID: Laura Ashley, female    DOB: 07/13/1990, 23 y.o.   MRN: [id number]  HPI Patient seen for irregular periods for 3 years.  Started menses at age of 23 yo.  Had regular periods until age of 23.  28 day cycles with flow 5 days.  At age 23, started having very irregular periods - periods about every 4-8 weeks, with bleeding lasting about 2-4 weeks.  Weight gain of 90 pounds over 2 years.  No medications.  No palliating or provoking factors.   Review of Systems  Constitutional: Negative for fever, chills and fatigue.  Respiratory: Negative for shortness of breath and wheezing.   Cardiovascular: Negative for chest pain, palpitations and leg swelling.  Gastrointestinal: Negative for nausea, vomiting, abdominal pain, diarrhea and constipation.  Endocrine: Negative for cold intolerance, heat intolerance, polydipsia, polyphagia and polyuria.  Genitourinary: Negative for dysuria, urgency, vaginal bleeding, vaginal discharge and vaginal pain.       Objective:   Physical Exam  Constitutional: She is oriented to person, place, and time. She appears well-developed and well-nourished.  HENT:  Head: Normocephalic and atraumatic.  Neck: Normal range of motion. Neck supple. No thyromegaly present.  Cardiovascular: Normal rate, regular rhythm and normal heart sounds.   Pulmonary/Chest: Effort normal and breath sounds normal. No respiratory distress. She has no wheezes. She has no rales. She exhibits no tenderness.  Abdominal: Soft. Bowel sounds are normal. She exhibits no distension and no mass. There is no tenderness. There is no rebound and no guarding. Hernia confirmed negative in the right inguinal area and confirmed negative in the left inguinal area.  Morbidly Obese  Genitourinary: No labial fusion. There is no rash, tenderness, lesion or injury on the right labia. There is no rash, tenderness, lesion or injury on the left labia. Uterus is not deviated, not enlarged, not  fixed and not tender. Cervix exhibits no motion tenderness, no discharge and no friability. Right adnexum displays no mass, no tenderness and no fullness. Left adnexum displays no mass, no tenderness and no fullness. No erythema or tenderness in the vagina. No foreign body around the vagina. No signs of injury around the vagina. No vaginal discharge found.  Musculoskeletal: Normal range of motion. She exhibits no edema or tenderness.  Lymphadenopathy:       Right: No inguinal adenopathy present.       Left: No inguinal adenopathy present.  Neurological: She is alert and oriented to person, place, and time.  Skin: Skin is warm and dry. No rash noted. No erythema. No pallor.  Psychiatric: She has a normal mood and affect. Her behavior is normal. Judgment and thought content normal.       Assessment & Plan:   Problem List Items Addressed This Visit    Morbidly obese   Hypertension   Dysfunctional uterine bleeding - Primary   Relevant Orders      TSH      CBC      Hemoglobin A1c      Cytology - PAP     With morbid obesity, the risk of having endocrine based obesity is fairly significant.  Will get TSH, CBC, HgA1c.  PAP done today.  If labs normal, will place on OCPs to regulate.  Will get BMP to r/o primary htn.  Will start pt on HCTZ 25mg  daily.  Discussed that patient should establish with family doctor for hypertension.

## 2014-02-28 NOTE — Patient Instructions (Signed)
Dysfunctional Uterine Bleeding Normally, menstrual periods begin between ages 11 to 17 in young women. A normal menstrual cycle/period may begin every 23 days up to 35 days and lasts from 1 to 7 days. Around 12 to 14 days before your menstrual period starts, ovulation (ovary produces an egg) occurs. When counting the time between menstrual periods, count from the first day of bleeding of the previous period to the first day of bleeding of the next period. Dysfunctional (abnormal) uterine bleeding is bleeding that is different from a normal menstrual period. Your periods may come earlier or later than usual. They may be lighter, have blood clots or be heavier. You may have bleeding between periods, or you may skip one period or more. You may have bleeding after sexual intercourse, bleeding after menopause, or no menstrual period. CAUSES   Pregnancy (normal, miscarriage, tubal).  IUDs (intrauterine device, birth control).  Birth control pills.  Hormone treatment.  Menopause.  Infection of the cervix.  Blood clotting problems.  Infection of the inside lining of the uterus.  Endometriosis, inside lining of the uterus growing in the pelvis and other female organs.  Adhesions (scar tissue) inside the uterus.  Obesity or severe weight loss.  Uterine polyps inside the uterus.  Cancer of the vagina, cervix, or uterus.  Ovarian cysts or polycystic ovary syndrome.  Medical problems (diabetes, thyroid disease).  Uterine fibroids (noncancerous tumor).  Problems with your female hormones.  Endometrial hyperplasia, very thick lining and enlarged cells inside of the uterus.  Medicines that interfere with ovulation.  Radiation to the pelvis or abdomen.  Chemotherapy. DIAGNOSIS   Your doctor will discuss the history of your menstrual periods, medicines you are taking, changes in your weight, stress in your life, and any medical problems you may have.  Your doctor will do a physical  and pelvic examination.  Your doctor may want to perform certain tests to make a diagnosis, such as:  Pap test.  Blood tests.  Cultures for infection.  CT scan.  Ultrasound.  Hysteroscopy.  Laparoscopy.  MRI.  Hysterosalpingography.  D and C.  Endometrial biopsy. TREATMENT  Treatment will depend on the cause of the dysfunctional uterine bleeding (DUB). Treatment may include:  Observing your menstrual periods for a couple of months.  Prescribing medicines for medical problems, including:  Antibiotics.  Hormones.  Birth control pills.  Removing an IUD (intrauterine device, birth control).  Surgery:  D and C (scrape and remove tissue from inside the uterus).  Laparoscopy (examine inside the abdomen with a lighted tube).  Uterine ablation (destroy lining of the uterus with electrical current, laser, heat, or freezing).  Hysteroscopy (examine cervix and uterus with a lighted tube).  Hysterectomy (remove the uterus). HOME CARE INSTRUCTIONS   If medicines were prescribed, take exactly as directed. Do not change or switch medicines without consulting your caregiver.  Long term heavy bleeding may result in iron deficiency. Your caregiver may have prescribed iron pills. They help replace the iron that your body lost from heavy bleeding. Take exactly as directed.  Do not take aspirin or medicines that contain aspirin one week before or during your menstrual period. Aspirin may make the bleeding worse.  If you need to change your sanitary pad or tampon more than once every 2 hours, stay in bed with your feet elevated and a cold pack on your lower abdomen. Rest as much as possible, until the bleeding stops or slows down.  Eat well-balanced meals. Eat foods high in iron. Examples   are:  Leafy green vegetables.  Whole-grain breads and cereals.  Eggs.  Meat.  Liver.  Do not try to lose weight until the abnormal bleeding has stopped and your blood iron level is  back to normal. Do not lift more than ten pounds or do strenuous activities when you are bleeding.  For a couple of months, make note on your calendar, marking the start and ending of your period, and the type of bleeding (light, medium, heavy, spotting, clots or missed periods). This is for your caregiver to better evaluate your problem. SEEK MEDICAL CARE IF:   You develop nausea (feeling sick to your stomach) and vomiting, dizziness, or diarrhea while you are taking your medicine.  You are getting lightheaded or weak.  You have any problems that may be related to the medicine you are taking.  You develop pain with your DUB.  You want to remove your IUD.  You want to stop or change your birth control pills or hormones.  You have any type of abnormal bleeding mentioned above.  You are over 16 years old and have not had a menstrual period yet.  You are 23 years old and you are still having menstrual periods.  You have any of the symptoms mentioned above.  You develop a rash. SEEK IMMEDIATE MEDICAL CARE IF:   An oral temperature above 102 F (38.9 C) develops.  You develop chills.  You are changing your sanitary pad or tampon more than once an hour.  You develop abdominal pain.  You pass out or faint. Document Released: 02/27/2000 Document Revised: 05/24/2011 Document Reviewed: 01/28/2009 Exit[employer] Patient Information 2015 [employer]. This information is not intended to replace advice given to you by your health care provider. Make sure you discuss any questions you have with your health care provider.  

## 2014-03-01 LAB — HEMOGLOBIN A1C
Hgb A1c MFr Bld: 5.6 % (ref <5.7)
Mean Plasma Glucose: 114 mg/dL (ref <117)

## 2014-03-01 LAB — BASIC METABOLIC PANEL WITH GFR
BUN: 8 mg/dL (ref 6–23)
CALCIUM: 9.7 mg/dL (ref 8.4–10.5)
CO2: 26 mEq/L (ref 19–32)
Chloride: 103 mEq/L (ref 96–112)
Creat: 0.86 mg/dL (ref 0.50–1.10)
GFR, Est Non African American: >89 mL/min
GLUCOSE: 94 mg/dL (ref 70–99)
Potassium: 4 mEq/L (ref 3.5–5.3)
SODIUM: 138 meq/L (ref 135–145)

## 2014-03-01 LAB — TSH: TSH: 0.85 u[IU]/mL (ref 0.350–4.500)

## 2014-03-04 ENCOUNTER — Other Ambulatory Visit: Payer: Self-pay | Admitting: Family Medicine

## 2014-03-04 ENCOUNTER — Telehealth: Payer: Self-pay

## 2014-03-04 DIAGNOSIS — I10 Essential (primary) hypertension: Secondary | ICD-10-CM

## 2014-03-04 LAB — CYTOLOGY - PAP

## 2014-03-04 MED ORDER — NORGESTIMATE-ETH ESTRADIOL 0.25-35 MG-MCG PO TABS: 1.0000 | ORAL_TABLET | Freq: Every day | ORAL | Status: DC

## 2014-03-04 NOTE — Telephone Encounter (Signed)
-----   Message from Levie HeritageJacob J Stinson, DO sent at 03/04/2014 10:53 AM EST ----- Labs were normal.  Please let pt know.  Sent prescription for OCPs to pharmacy.  Also, referred pt to family medicine for HTN.

## 2014-03-04 NOTE — Telephone Encounter (Addendum)
Attempted to contact patient. No answer. Left message stating we are calling with results, please call clinic.   Patient returned call. Informed her of results, medication sent to pharmacy and referral to family practice. Gave her number to family practice incase she does not hear from them-- advised she call in one week if she has not been informed of an appointment and to call clinic with any questions or concerns.

## 2014-04-10 ENCOUNTER — Telehealth: Payer: Self-pay | Admitting: *Deleted

## 2014-04-10 NOTE — Telephone Encounter (Signed)
Pt left message stating that she was seen on 12/17 and prescribed Sprintec. She took all the pills in the pack including the white ones. She got a period and is having cramps with large clots. She is concerned and wants to know if this is normal.

## 2014-04-11 NOTE — Telephone Encounter (Signed)
Called patient and advised her that the white pills are inactive pills and it is normal to have a cycle during this time. Inquired about her bleeding and she stated that it is heavy but not enough to saturate a pad in an hour. Advised her that if bleeding persists once she starts new pack or she is saturating pads in less than an hour to call us or go to MAU if after hours. Patient is agreeable.

## 2014-06-03 ENCOUNTER — Telehealth: Payer: Self-pay | Admitting: General Practice

## 2014-06-03 NOTE — Telephone Encounter (Signed)
Patient called and left message stating she has questions about birth control and her cycle. Called patient back and she states she was put on OCPs in December to regulate her cycles. Patient states she got a period in January and it was very heavy and she had a lot of cramping. Patient states after that she has not taken any more OCPs. Patient states she would like to know when she can expect a period. Told patient that unfortunately that is not something we can predict and that she needs to be on the pills for several months not just one month. Patient verbalized understanding and states that she really doesn't want to take them because she would like to get pregnant. Told patient she doesn't have to take the pills then if she doesn't want to but I am unable to say when she may get a period. Told patient certainly her and her doctor can discuss this at her visit next month on 4/25 about next steps and moving forward. Patient verbalized understanding and had no other questions

## 2014-07-08 ENCOUNTER — Encounter: Payer: Self-pay | Admitting: Family Medicine

## 2014-07-08 ENCOUNTER — Ambulatory Visit (INDEPENDENT_AMBULATORY_CARE_PROVIDER_SITE_OTHER): Payer: Self-pay | Admitting: Family Medicine

## 2014-07-08 VITALS — BP 146/85 | HR 68 | Ht 64.0 in | Wt 288.4 lb

## 2014-07-08 DIAGNOSIS — Z3202 Encounter for pregnancy test, result negative: Secondary | ICD-10-CM

## 2014-07-08 DIAGNOSIS — N938 Other specified abnormal uterine and vaginal bleeding: Secondary | ICD-10-CM

## 2014-07-08 LAB — POCT PREGNANCY, URINE: PREG TEST UR: NEGATIVE

## 2014-07-08 NOTE — Progress Notes (Deleted)
Patient ID: Laura IdolShelise Salsgiver, female   DOB: 10/29/1990, 24 y.o.   MRN: [id number]

## 2014-07-08 NOTE — Progress Notes (Signed)
   Subjective:    Patient ID: Laura Ashley, female    DOB: 11/10/1990, 24 y.o.   MRN: [id number]  HPI Patient seen for irregular vaginal bleeding.  She took one month of OCPs, but then stopped.  During the last week (1/20-1/30), she had heavy bleeding with clots, but has not had a period since then.  She stopped taking the OCPs after than first month.  She has taken several pregnancy tests, last test on 3/15, all of which were negative.   Review of Systems     Objective:   Physical Exam  Constitutional: She is oriented to person, place, and time. She appears well-developed and well-nourished.  Abdominal: Soft. Bowel sounds are normal. She exhibits no distension and no mass. There is no tenderness. There is no rebound and no guarding.  Neurological: She is alert and oriented to person, place, and time.  Skin: Skin is warm and dry. No rash noted. No erythema. No pallor.  Psychiatric: She has a normal mood and affect. Her behavior is normal. Judgment and thought content normal.       Assessment & Plan:   Problem List Items Addressed This Visit    Dysfunctional uterine bleeding - Primary   Relevant Orders   POCT urine pregnancy   US Pelvis Complete   Testosterone   Testosterone, free   Testosterone, % free   Sex hormone binding globulin   LH   FSH     Likely due to hormone, but patient would like workup for PCOS.  Will check Pelvic US, testosterone, FSH, LH.

## 2014-07-09 LAB — LUTEINIZING HORMONE: LH: 5.2 m[IU]/mL

## 2014-07-09 LAB — FOLLICLE STIMULATING HORMONE: FSH: 4.5 m[IU]/mL

## 2014-07-09 LAB — TESTOSTERONE, % FREE: TESTOSTERONE-% FREE: 2.8 % — AB (ref 0.4–2.4)

## 2014-07-09 LAB — SEX HORMONE BINDING GLOBULIN: Sex Hormone Binding: 13 nmol/L — ABNORMAL LOW (ref 17–124)

## 2014-07-09 LAB — TESTOSTERONE: Testosterone: 51 ng/dL (ref 10–70)

## 2014-07-09 LAB — TESTOSTERONE, FREE: TESTOSTERONE FREE: 14.2 pg/mL — AB (ref 0.6–6.8)

## 2014-07-16 ENCOUNTER — Other Ambulatory Visit: Payer: Self-pay | Admitting: Family Medicine

## 2014-07-16 ENCOUNTER — Ambulatory Visit (HOSPITAL_COMMUNITY)
Admission: RE | Admit: 2014-07-16 | Discharge: 2014-07-16 | Disposition: A | Discharge status: Home / Self Care | Payer: Self-pay | Source: Ambulatory Visit | Attending: Family Medicine | Admitting: Family Medicine

## 2014-07-16 DIAGNOSIS — N938 Other specified abnormal uterine and vaginal bleeding: Secondary | ICD-10-CM | POA: Insufficient documentation

## 2014-07-16 DIAGNOSIS — N8320 Unspecified ovarian cysts: Secondary | ICD-10-CM | POA: Insufficient documentation

## 2014-07-22 ENCOUNTER — Telehealth: Payer: Self-pay | Admitting: *Deleted

## 2014-07-22 DIAGNOSIS — E282 Polycystic ovarian syndrome: Secondary | ICD-10-CM

## 2014-07-22 MED ORDER — METFORMIN HCL 500 MG PO TABS: 500.0000 mg | ORAL_TABLET | Freq: Two times a day (BID) | ORAL | Status: DC

## 2014-07-22 NOTE — Telephone Encounter (Signed)
-----   Message from Laura HeritageJacob J Stinson, DO sent at 07/22/2014  1:33 PM EDT ----- Has elevated testosterone level, which would imply PCOS.  Can either start metformin 500mg  BID to see if this will help her ovulation or start birth control (OCPs) to directly counteract the testosterone.  She does have an ovarian cyst and needs a follow up US in 2 months.

## 2014-07-22 NOTE — Telephone Encounter (Signed)
Pt elected to try Metformin. Rx called to her pharmacy. Ultrasound scheduled for July 12 at 0815.

## 2014-07-23 ENCOUNTER — Telehealth: Payer: Self-pay | Admitting: *Deleted

## 2014-07-23 NOTE — Telephone Encounter (Signed)
Patient called to see if her metformin rx has been sent in.

## 2014-07-23 NOTE — Telephone Encounter (Signed)
Called patient and informed her of medication sent to her pharmacy. Patient verbalized understanding and had no questions

## 2014-09-24 ENCOUNTER — Ambulatory Visit (HOSPITAL_COMMUNITY): Admission: RE | Admit: 2014-09-24 | Payer: Self-pay | Source: Ambulatory Visit

## 2014-09-25 ENCOUNTER — Ambulatory Visit (HOSPITAL_COMMUNITY)
Admission: RE | Admit: 2014-09-25 | Discharge: 2014-09-25 | Disposition: A | Discharge status: Home / Self Care | Payer: Self-pay | Source: Ambulatory Visit | Attending: Family Medicine | Admitting: Family Medicine

## 2014-09-25 DIAGNOSIS — E282 Polycystic ovarian syndrome: Secondary | ICD-10-CM

## 2014-09-25 DIAGNOSIS — N8320 Unspecified ovarian cysts: Secondary | ICD-10-CM | POA: Insufficient documentation

## 2014-10-08 ENCOUNTER — Telehealth: Payer: Self-pay | Admitting: General Practice

## 2014-10-08 DIAGNOSIS — N83209 Unspecified ovarian cyst, unspecified side: Secondary | ICD-10-CM

## 2014-10-08 NOTE — Telephone Encounter (Signed)
-----   Message from [name], DO sent at 10/08/2014  7:19 AM EDT ----- Cyst is stable.  Repeat in 6 months.

## 2014-10-08 NOTE — Telephone Encounter (Signed)
Ultrasound scheduled 03/19/15 @ 930. Called patient twice, someone would answer then hang up the line. Will try later

## 2014-10-08 NOTE — Telephone Encounter (Signed)
Patient called back to front office and I informed her of ultrasound results and need for follow up. Also informed patient of 03/19/15 appt for ultrasound. Patient verbalized understanding and asked why she was supposed to take the metformin and what did it do for her cyst. Patient also states that she stopped the birth control pills. Discussed with patient (per Dr Erin Fulling) that the metformin does not help her cyst but it helps her body with insulin production which is important because with PCOS women are more likely to develop diabetes and that the medicine will also help with future fertility if that's something she desires. Discussed with patient that the birth control would help prevent ovarian cysts and would also hopefully help her cycles to return. Patient verbalized understanding to all and states she will restart the metformin and birth control pills. Patient had no other questions

## 2014-12-02 ENCOUNTER — Telehealth: Payer: Self-pay | Admitting: General Practice

## 2014-12-02 NOTE — Telephone Encounter (Signed)
Patient called in to front office stating she started her period while on the blue pills of her pack and doesn't know if she should start taking the white pills or not. Asked patient when her last blue pill was. Patient states on the 12th. Asked patient if she has taken any white pills yet. Patient states no. Told patient she should start another pack tomorrow. Told patient that the blue pills are the active hormones and the white pills are a placebo. Told patient the white pills do not make her get her period. She is supposed to start her period during the white pills since she is no longer taking the hormones. Discussed the importance of still taking the white pills as they keep her on track with where she is in her cycle and help her remember to take the pills and when to start a new pack. Told patient she likely had bleeding on the blue pills since she just recently started taking the pills. Discussed with patient that starting birth control pills or missing doses can cause irregular bleeding. Patient verbalized understanding to all and had no questions

## 2015-03-19 ENCOUNTER — Ambulatory Visit (HOSPITAL_COMMUNITY): Payer: Self-pay | Attending: Family Medicine

## 2015-04-01 ENCOUNTER — Ambulatory Visit (HOSPITAL_COMMUNITY)
Admission: RE | Admit: 2015-04-01 | Discharge: 2015-04-01 | Disposition: A | Discharge status: Home / Self Care | Payer: Self-pay | Source: Ambulatory Visit | Attending: Family Medicine | Admitting: Family Medicine

## 2015-04-01 DIAGNOSIS — N83209 Unspecified ovarian cyst, unspecified side: Secondary | ICD-10-CM | POA: Insufficient documentation

## 2015-04-08 NOTE — Progress Notes (Signed)
Letter has been sent

## 2015-09-04 IMAGING — US US TRANSVAGINAL NON-OB
1 series · 13 of 25 positions shown · non-contrast
Comparison: None

CLINICAL DATA: Followup ovarian cyst.



[Series 1: us pelvis complete · 13 of 33 slices shown]
[im 1/33]
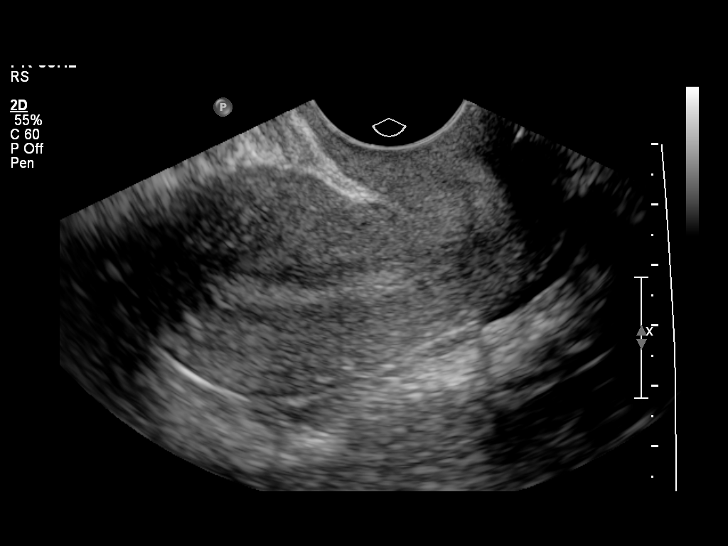
[im 3/33]
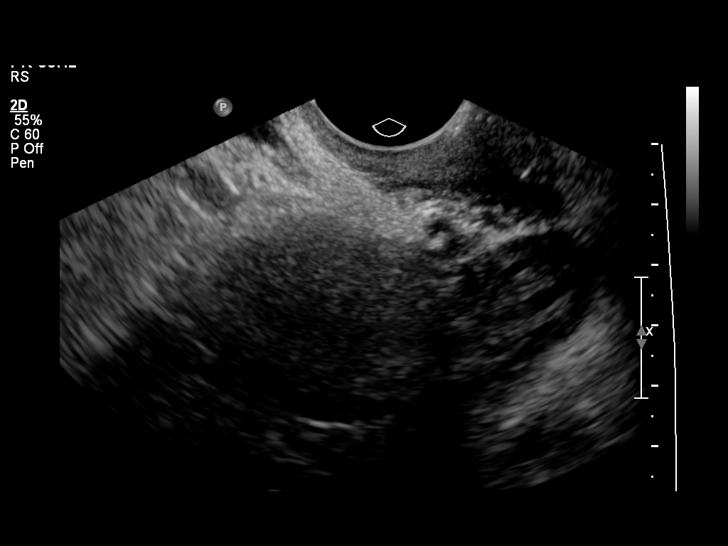
[im 6/33]
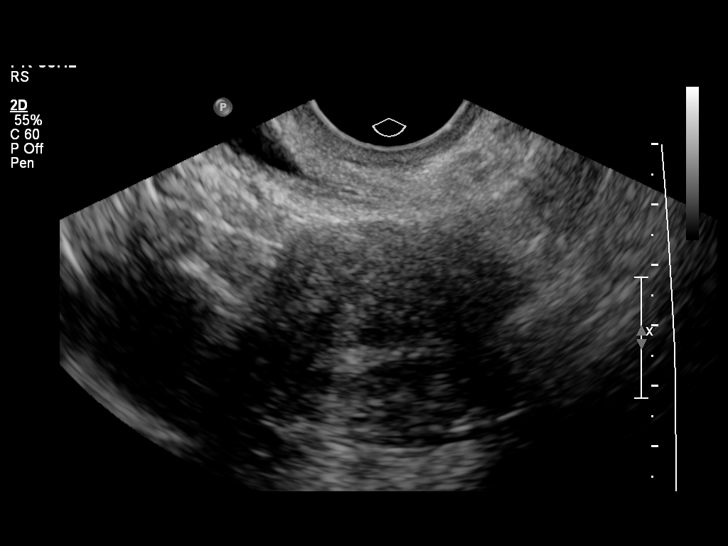
[im 9/33]
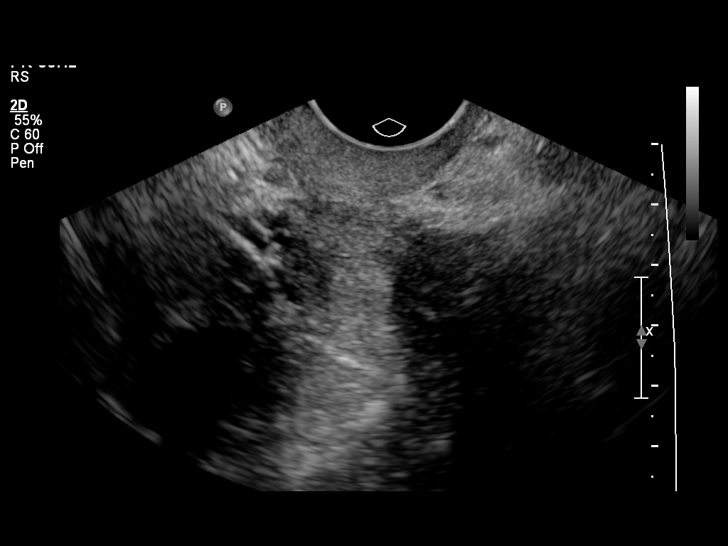
[im 11/33]
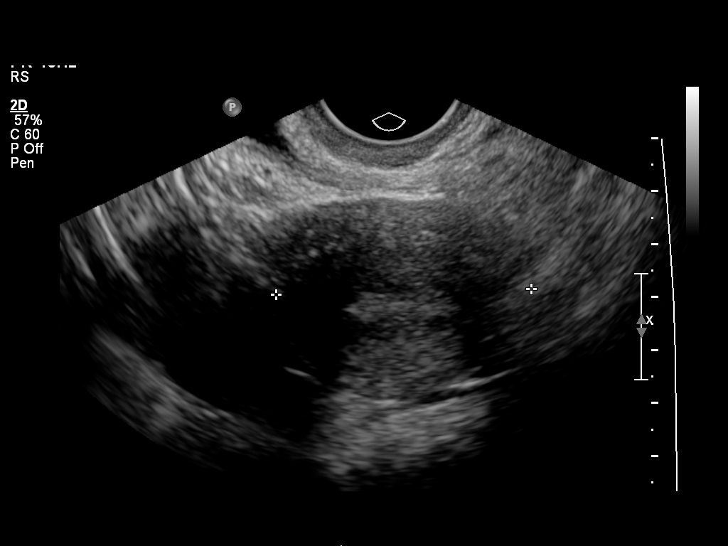
[im 14/33]
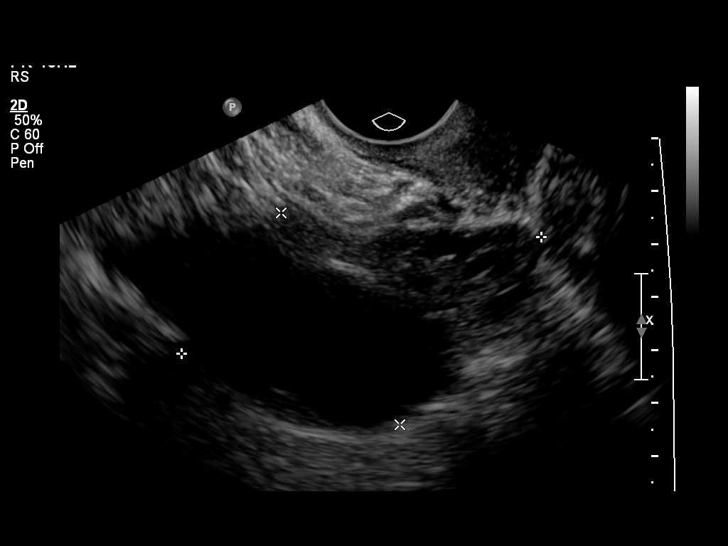
[im 17/33]
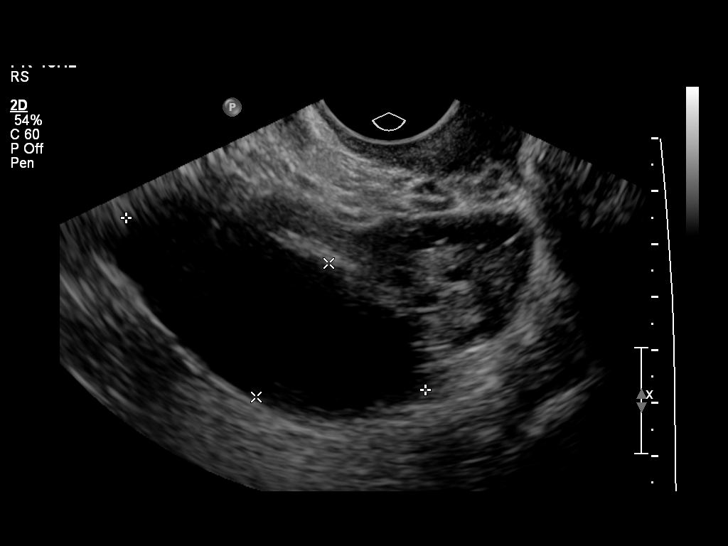
[im 19/33]
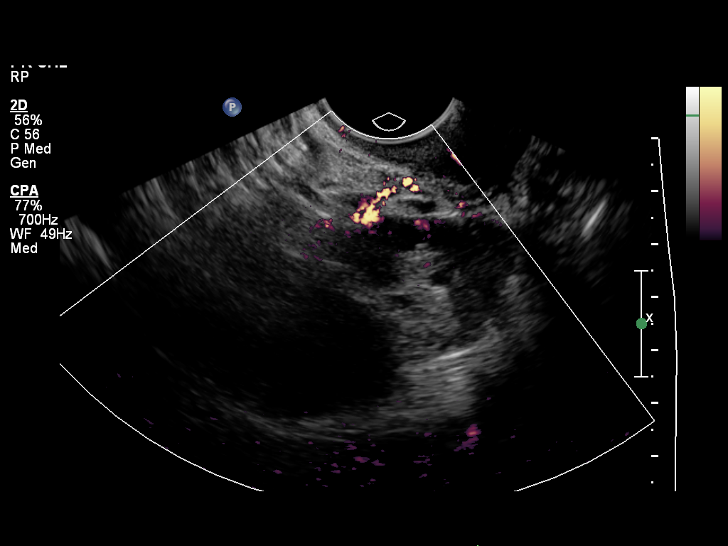
[im 22/33]
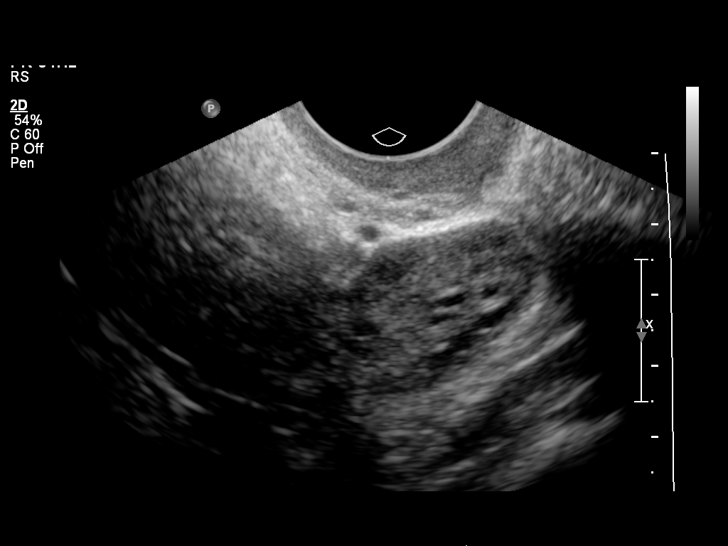
[im 25/33]
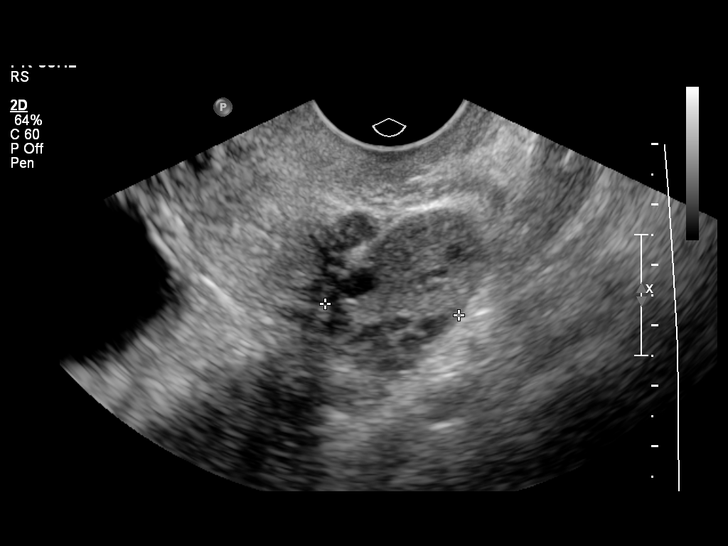
[im 27/33]
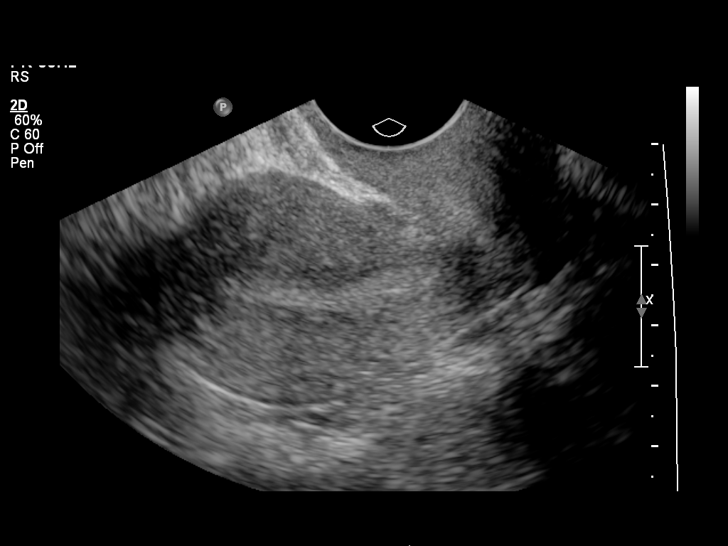
[im 30/33]
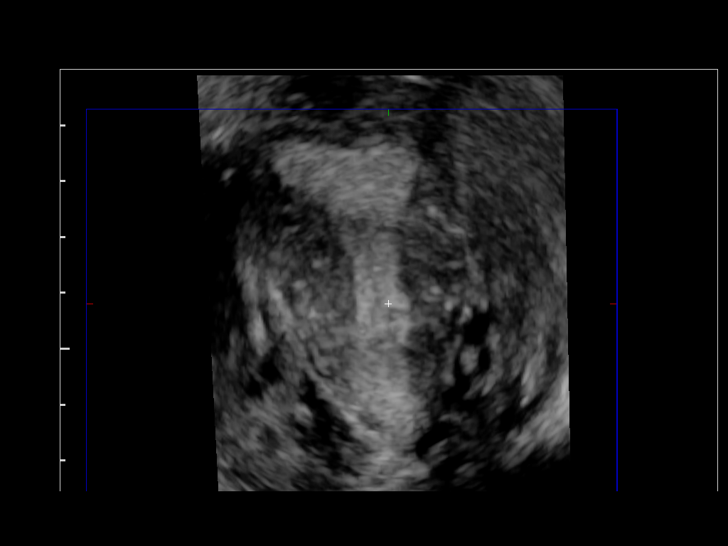
[im 33/33]
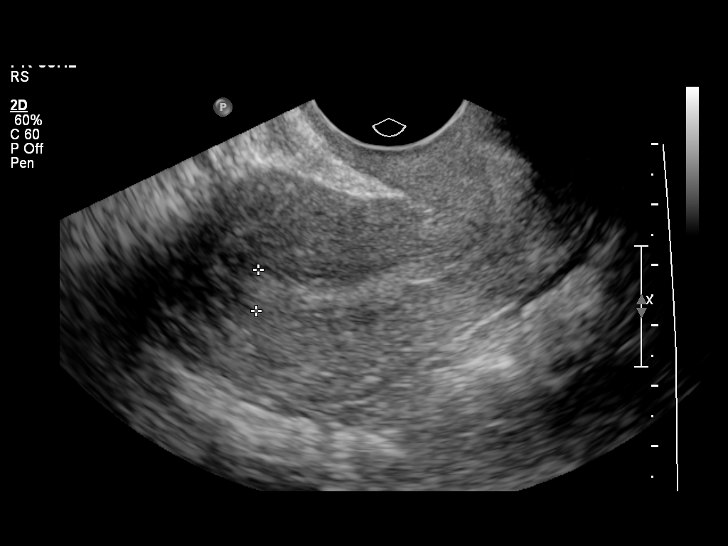

[13 of 25 positions shown; findings below may reference images not displayed]

FINDINGS: Uterus

Measurements: 8.1 x 4.0 x 4.8 cm.. No fibroids or other mass
visualized.

Endometrium

Thickness: 7 mm..  No focal abnormality visualized.

Right ovary

Measurements: 7.1 x 4.6 x 3.7 cm. There is a cyst measuring 6.5 x
2.9 x 3.7 cm. No septation or nodularity. Previously 5.9 x 3.0 x
cm.

Left ovary

Measurements: 3.6 x 2.1 x 2.2 cm. Normal appearance/no adnexal mass.

Other findings

No free fluid.
IMPRESSION: 1. Persistent right ovary cyst. This is almost certainly benign, but
follow up ultrasound is recommended in 1 year according to the
Society of Radiologists in PltrasoundQMPM Consensus Conference
Statement (Porscha Kc et al. Management of Asymptomatic Ovarian and
Other Adnexal Cysts Imaged at US: Society of Radiologists in

## 2015-10-08 ENCOUNTER — Ambulatory Visit: Payer: Self-pay | Admitting: Obstetrics & Gynecology

## 2015-10-09 ENCOUNTER — Ambulatory Visit: Payer: Self-pay | Admitting: Family Medicine

## 2016-01-10 ENCOUNTER — Inpatient Hospital Stay (HOSPITAL_COMMUNITY)
Admission: AD | Admit: 2016-01-10 | Discharge: 2016-01-10 | Disposition: A | Discharge status: Home / Self Care | Payer: Self-pay | Source: Ambulatory Visit | Attending: Obstetrics & Gynecology | Admitting: Obstetrics & Gynecology

## 2016-01-10 ENCOUNTER — Encounter (HOSPITAL_COMMUNITY): Payer: Self-pay | Admitting: *Deleted

## 2016-01-10 DIAGNOSIS — Z79899 Other long term (current) drug therapy: Secondary | ICD-10-CM | POA: Insufficient documentation

## 2016-01-10 DIAGNOSIS — B9689 Other specified bacterial agents as the cause of diseases classified elsewhere: Secondary | ICD-10-CM

## 2016-01-10 DIAGNOSIS — E282 Polycystic ovarian syndrome: Secondary | ICD-10-CM | POA: Insufficient documentation

## 2016-01-10 DIAGNOSIS — Z8249 Family history of ischemic heart disease and other diseases of the circulatory system: Secondary | ICD-10-CM | POA: Insufficient documentation

## 2016-01-10 DIAGNOSIS — Z880 Allergy status to penicillin: Secondary | ICD-10-CM | POA: Insufficient documentation

## 2016-01-10 DIAGNOSIS — Z7984 Long term (current) use of oral hypoglycemic drugs: Secondary | ICD-10-CM | POA: Insufficient documentation

## 2016-01-10 DIAGNOSIS — R102 Pelvic and perineal pain: Secondary | ICD-10-CM | POA: Insufficient documentation

## 2016-01-10 DIAGNOSIS — N760 Acute vaginitis: Secondary | ICD-10-CM

## 2016-01-10 LAB — URINALYSIS, ROUTINE W REFLEX MICROSCOPIC
Bilirubin Urine: NEGATIVE
GLUCOSE, UA: NEGATIVE mg/dL
Ketones, ur: NEGATIVE mg/dL
Leukocytes, UA: NEGATIVE
Nitrite: NEGATIVE
PROTEIN: NEGATIVE mg/dL
Specific Gravity, Urine: >1.03 — ABNORMAL HIGH (ref 1.005–1.030)
pH: 6 (ref 5.0–8.0)

## 2016-01-10 LAB — WET PREP, GENITAL
Sperm: NONE SEEN
TRICH WET PREP: NONE SEEN
Yeast Wet Prep HPF POC: NONE SEEN

## 2016-01-10 LAB — POCT PREGNANCY, URINE: Preg Test, Ur: NEGATIVE

## 2016-01-10 LAB — URINE MICROSCOPIC-ADD ON

## 2016-01-10 MED ORDER — METRONIDAZOLE 500 MG PO TABS: 500.0000 mg | ORAL_TABLET | Freq: Two times a day (BID) | ORAL | 0 refills | Status: DC

## 2016-01-10 MED ORDER — METRONIDAZOLE 500 MG PO TABS
500.0000 mg | ORAL_TABLET | Freq: Two times a day (BID) | ORAL | 0 refills | Status: AC
Start: 2016-01-10 — End: 2016-01-17

## 2016-01-10 NOTE — MAU Note (Signed)
Having vaginal pain for 2 months. Vaginal itching for 2 wks. No vag discharge

## 2016-01-10 NOTE — Discharge Instructions (Signed)

## 2016-01-10 NOTE — MAU Provider Note (Signed)
MAU HISTORY AND PHYSICAL  Chief Complaint:  Vaginal Pain   Laura Ashley is a 25 y.o.  G2P0020 not currently pregnant presenting for Vaginal Pain  She has had vaginal pain on and off for the past 3-4 months. In past several days has had more pain and some itching. Denies discharge. Is sexually active with one female partner, does not use condoms, no current birth control. Periods are irregular, has PCOS. Feels like her period may be coming on now, has noticed some light spotting.  Denies fevers, chills, abdominal pain, pain with intercourse.    Past Medical History:  Diagnosis Date  . Medical history non-contributory     Past Surgical History:  Procedure Laterality Date  . NO PAST SURGERIES      Family History  Problem Relation Age of Onset  . Hypertension Father   . Diabetes Other     Social History  Substance Use Topics  . Smoking status: Never Smoker  . Smokeless tobacco: Never Used  . Alcohol use No    Allergies  Allergen Reactions  . Penicillins Rash    rash    Prescriptions Prior to Admission  Medication Sig Dispense Refill Last Dose  . hydrochlorothiazide (HYDRODIURIL) 25 MG tablet Take 1 tablet (25 mg total) by mouth daily. (Patient not taking: Reported on 07/08/2014) 30 tablet 1 Not Taking  . metFORMIN (GLUCOPHAGE) 500 MG tablet Take 1 tablet (500 mg total) by mouth 2 (two) times daily with a meal. 60 tablet 3   . norgestimate-ethinyl estradiol (ORTHO-CYCLEN,SPRINTEC,PREVIFEM) 0.25-35 MG-MCG tablet Take 1 tablet by mouth daily. (Patient not taking: Reported on 07/08/2014) 1 Package 11 Not Taking    Review of Systems - Negative except for what is mentioned in HPI.  Physical Exam  Blood pressure 135/66, pulse 104, temperature 98.4 F (36.9 C), resp. rate 18, height 5\' 4"  (1.626 m), weight 127.2 kg (280 lb 6.4 oz). GENERAL: Well-developed, well-nourished female in no acute distress.  LUNGS: No respiratory distress HEART: Regular rate ABDOMEN: Soft,  nontender, nondistended EXTREMITIES: Nontender, no edema, 2+ distal pulses. GU: normal external genitalia, no rashes noted, no discharge, scant amount of clear mucous in vaginal canal, cervix pink no lesions   Labs: Results for orders placed or performed during the hospital encounter of 01/10/16 (from the past 24 hour(s))  Urinalysis, Routine w reflex microscopic ([facility])   Collection Time: 01/10/16 10:20 PM  Result Value Ref Range   Color, Urine YELLOW YELLOW   APPearance CLEAR CLEAR   Specific Gravity, Urine >1.030 (H) 1.005 - 1.030   pH 6.0 5.0 - 8.0   Glucose, UA NEGATIVE NEGATIVE mg/dL   Hgb urine dipstick MODERATE (A) NEGATIVE   Bilirubin Urine NEGATIVE NEGATIVE   Ketones, ur NEGATIVE NEGATIVE mg/dL   Protein, ur NEGATIVE NEGATIVE mg/dL   Nitrite NEGATIVE NEGATIVE   Leukocytes, UA NEGATIVE NEGATIVE  Urine microscopic-add on   Collection Time: 01/10/16 10:20 PM  Result Value Ref Range   Squamous Epithelial / LPF 0-5 (A) NONE SEEN   WBC, UA 0-5 0 - 5 WBC/hpf   RBC / HPF 6-30 0 - 5 RBC/hpf   Bacteria, UA FEW (A) NONE SEEN  Pregnancy, urine POC   Collection Time: 01/10/16 10:23 PM  Result Value Ref Range   Preg Test, Ur NEGATIVE NEGATIVE  Wet prep, genital   Collection Time: 01/10/16 10:43 PM  Result Value Ref Range   Yeast Wet Prep HPF POC NONE SEEN NONE SEEN   Trich, Wet Prep NONE  SEEN NONE SEEN   Clue Cells Wet Prep HPF POC PRESENT (A) NONE SEEN   WBC, Wet Prep HPF POC FEW (A) NONE SEEN   Sperm NONE SEEN     Imaging Studies:  No results found.  Assessment: Laura Ashley is  25 y.o. G2P0020 at Unknown presents with Vaginal Pain . MDM UA Urine Pregnancy Test Wet prep Gc/chlamydia  Plan: Bacterial Vaginosis Rx given for 7 day course of Flagyl Patient discharged home, verbalized agreement and understanding of plan  Tillman SersAngela C Riccio, DO PGY-1 10/28/201711:13 PM   I was consulted RE: exam and POC and agree with above.  [place] Antonina Deziel,  CNM 01/11/2016 12:47 AM

## 2016-01-12 LAB — GC/CHLAMYDIA PROBE AMP (~~LOC~~) NOT AT ARMC
Chlamydia: NEGATIVE
Neisseria Gonorrhea: NEGATIVE

## 2016-02-19 ENCOUNTER — Telehealth: Payer: Self-pay | Admitting: *Deleted

## 2016-02-19 NOTE — Telephone Encounter (Signed)
"  I need to schedule an appointment for a free Pap smear.  I do not have insurance.  [facility] was told you all do PAPs and given this number [phone].  I had a PAP at age 25, missed last year and need this as I'm having some itching down there.  A little white discharge today.  No odor."   Message left with [facility] and [facility] and [facility].  [facility] on [address] suggested for the vaginal symptoms described.  No further questions.

## 2016-02-24 ENCOUNTER — Telehealth (HOSPITAL_COMMUNITY): Payer: Self-pay | Admitting: *Deleted

## 2016-02-24 NOTE — Telephone Encounter (Signed)
Patient called and spoke with triage in regards to our free Pap smear screenings. Attempted to call patient back. No one answered phone. Left voicemail for patient to call me back.

## 2016-03-11 ENCOUNTER — Encounter (HOSPITAL_COMMUNITY): Payer: Self-pay | Admitting: Emergency Medicine

## 2016-03-11 ENCOUNTER — Emergency Department (HOSPITAL_COMMUNITY)
Admission: EM | Admit: 2016-03-11 | Discharge: 2016-03-11 | Disposition: A | Discharge status: Home / Self Care | Payer: Self-pay | Attending: Emergency Medicine | Admitting: Emergency Medicine

## 2016-03-11 ENCOUNTER — Emergency Department (HOSPITAL_COMMUNITY): Payer: Self-pay

## 2016-03-11 DIAGNOSIS — I10 Essential (primary) hypertension: Secondary | ICD-10-CM | POA: Insufficient documentation

## 2016-03-11 DIAGNOSIS — J069 Acute upper respiratory infection, unspecified: Secondary | ICD-10-CM | POA: Insufficient documentation

## 2016-03-11 MED ORDER — AZITHROMYCIN 250 MG PO TABS: 250.0000 mg | ORAL_TABLET | Freq: Every day | ORAL | 0 refills | Status: DC

## 2016-03-11 NOTE — ED Triage Notes (Signed)
Pt c/o nasal congestion and cough x a week and a half and a "fever" of 99.4

## 2016-03-11 NOTE — Discharge Instructions (Signed)
Please read attached information. If you experience any new or worsening signs or symptoms please return to the emergency room for evaluation. Please follow-up with your primary care provider or specialist as discussed. Please use medication prescribed only as directed and discontinue taking if you have any concerning signs or symptoms.   °

## 2016-03-11 NOTE — ED Provider Notes (Signed)
[facility]-EMERGENCY DEPT Provider Note   CSN: [id number] Arrival date & time: 03/11/16  2003     History   Chief Complaint Chief Complaint  Patient presents with  . Nasal Congestion  . Cough    HPI Laura Ashley is a 25 y.o. female.  HPI   25 year old female presents today with approximately 2 weeks of upper respiratory congestion and cough. Patient notes that over the last 2 days the cough has significantly worsened, she feels warm at home, but has no elevated temperature. She notes the cough causes centralized chest pain, she reports earlier in the day she felt slightly short of breath, presently no shortness of breath. She denies any history of allergies or asthma, has been taking DayQuil and NyQuil at home with no significant improvement in her symptoms.patient denies any other complaints today.     Past Medical History:  Diagnosis Date  . Medical history non-contributory     Patient Active Problem List   Diagnosis Date Noted  . Morbidly obese (HCC) 02/28/2014  . Hypertension 02/28/2014  . Dysfunctional uterine bleeding 02/28/2014    Past Surgical History:  Procedure Laterality Date  . NO PAST SURGERIES      OB History    Gravida Para Term Preterm AB Living   2       2 0   SAB TAB Ectopic Multiple Live Births   2               Home Medications    Prior to Admission medications   Medication Sig Start Date End Date Taking? Authorizing Provider  azithromycin (ZITHROMAX) 250 MG tablet Take 1 tablet (250 mg total) by mouth daily. Take first 2 tablets together, then 1 every day until finished. 03/11/16   Eyvonne MechanicJeffrey Garlin Batdorf, PA-C  hydrochlorothiazide (HYDRODIURIL) 25 MG tablet Take 1 tablet (25 mg total) by mouth daily. Patient not taking: Reported on 07/08/2014 02/28/14   Levie HeritageJacob J Stinson, DO  metFORMIN (GLUCOPHAGE) 500 MG tablet Take 1 tablet (500 mg total) by mouth 2 (two) times daily with a meal. 07/22/14   Levie HeritageJacob J Stinson, DO  norgestimate-ethinyl estradiol  (ORTHO-CYCLEN,SPRINTEC,PREVIFEM) 0.25-35 MG-MCG tablet Take 1 tablet by mouth daily. Patient not taking: Reported on 07/08/2014 03/04/14   Levie HeritageJacob J Stinson, DO    Family History Family History  Problem Relation Age of Onset  . Hypertension Father   . Diabetes Other     Social History Social History  Substance Use Topics  . Smoking status: Never Smoker  . Smokeless tobacco: Never Used  . Alcohol use No     Allergies   Penicillins   Review of Systems Review of Systems  All other systems reviewed and are negative.    Physical Exam Updated Vital Signs BP 138/93 (BP Location: Left Arm)   Pulse 100   Temp 98.4 F (36.9 C) (Oral)   Resp 18   Wt 129.3 kg   LMP 01/22/2016 Comment: shielded  SpO2 100%   BMI 48.92 kg/m   Physical Exam  Constitutional: She is oriented to person, place, and time. She appears well-developed and well-nourished.  HENT:  Head: Normocephalic and atraumatic.  Right Ear: External ear normal.  Left Ear: External ear normal.  Nose: Nose normal.  Mouth/Throat: Oropharynx is clear and moist. No oropharyngeal exudate.  Eyes: Conjunctivae are normal. Pupils are equal, round, and reactive to light. Right eye exhibits no discharge. Left eye exhibits no discharge. No scleral icterus.  Neck: Normal range of motion. No JVD present. No  tracheal deviation present.  Pulmonary/Chest: Effort normal and breath sounds normal. No stridor. No respiratory distress. She has no wheezes. She has no rales. She exhibits no tenderness.  Breath sounds are distant due to patient body habitus  Neurological: She is alert and oriented to person, place, and time. Coordination normal.  Psychiatric: She has a normal mood and affect. Her behavior is normal. Judgment and thought content normal.  Nursing note and vitals reviewed.    ED Treatments / Results  Labs (all labs ordered are listed, but only abnormal results are displayed) Labs Reviewed - No data to display  EKG  EKG  Interpretation None       Radiology Dg Chest 2 View  Result Date: 03/11/2016 CLINICAL DATA:  Mid chest pain, cough and nasal congestion EXAM: CHEST  2 VIEW COMPARISON:  None. FINDINGS: The heart size and mediastinal contours are within normal limits. Both lungs are clear. The visualized skeletal structures are unremarkable. IMPRESSION: No active cardiopulmonary disease. Electronically Signed   By: Tollie Ethavid  Kwon M.D.   On: 03/11/2016 22:43    Procedures Procedures (including critical care time)  Medications Ordered in ED Medications - No data to display   Initial Impression / Assessment and Plan / ED Course  I have reviewed the triage vital signs and the nursing notes.  Pertinent labs & imaging results that were available during my care of the patient were reviewed by me and considered in my medical decision making (see chart for details).  Clinical Course     Final Clinical Impressions(s) / ED Diagnoses   Final diagnoses:  Upper respiratory tract infection, unspecified type    Labs:  Imaging:DG chest 2 view  Consults:  Therapeutics:  Discharge Meds: azithromycin  Assessment/Plan:[age] year old female presents today with upper respiratory infection she reports 2 weeks of symptoms worsening over the last several days. Although she has no objective signs of acute bacterial infection this illness is gone on for longer than would be expected for a viral illness.she is afebrile and nontoxic here. Patient will be treated with azithromycin, encouraged follow-up with primary care, return to the emergency room if any new or worsening signs or symptoms present. Patient verbalized her understanding and agreement to today's plan had no further questions or concerns at time of discharge      New Prescriptions New Prescriptions   AZITHROMYCIN (ZITHROMAX) 250 MG TABLET    Take 1 tablet (250 mg total) by mouth daily. Take first 2 tablets together, then 1 every day until finished.       Eyvonne MechanicJeffrey Legna Mausolf, PA-C 03/11/16 2322    Gerhard Munchobert Lockwood, MD 03/12/16 [phone]

## 2016-04-10 ENCOUNTER — Encounter (HOSPITAL_COMMUNITY): Payer: Self-pay | Admitting: Emergency Medicine

## 2016-04-10 ENCOUNTER — Emergency Department (HOSPITAL_COMMUNITY): Payer: Self-pay

## 2016-04-10 ENCOUNTER — Emergency Department (HOSPITAL_COMMUNITY)
Admission: EM | Admit: 2016-04-10 | Discharge: 2016-04-10 | Disposition: A | Discharge status: Home / Self Care | Payer: Self-pay | Attending: Emergency Medicine | Admitting: Emergency Medicine

## 2016-04-10 DIAGNOSIS — Z79899 Other long term (current) drug therapy: Secondary | ICD-10-CM | POA: Insufficient documentation

## 2016-04-10 DIAGNOSIS — J069 Acute upper respiratory infection, unspecified: Secondary | ICD-10-CM | POA: Insufficient documentation

## 2016-04-10 DIAGNOSIS — I10 Essential (primary) hypertension: Secondary | ICD-10-CM | POA: Insufficient documentation

## 2016-04-10 DIAGNOSIS — B9789 Other viral agents as the cause of diseases classified elsewhere: Secondary | ICD-10-CM

## 2016-04-10 MED ORDER — BENZONATATE 100 MG PO CAPS: 100.0000 mg | ORAL_CAPSULE | Freq: Three times a day (TID) | ORAL | 0 refills | Status: DC

## 2016-04-10 MED ORDER — ONDANSETRON HCL 4 MG PO TABS: 4.0000 mg | ORAL_TABLET | Freq: Four times a day (QID) | ORAL | 0 refills | Status: DC

## 2016-04-10 MED ORDER — OSELTAMIVIR PHOSPHATE 75 MG PO CAPS: 75.0000 mg | ORAL_CAPSULE | Freq: Two times a day (BID) | ORAL | 0 refills | Status: DC

## 2016-04-10 NOTE — Discharge Instructions (Signed)
Rest, drink plenty of fluids. Take ibuprofen and Tylenol for any body aches and fever. Take Zofran as prescribed as needed for nausea. Take Tessalon for cough. Take Tamiflu if you choose to do so, however keep in mind of possible side effects. Follow up with primary care doctor as needed.

## 2016-04-10 NOTE — ED Triage Notes (Signed)
Pt recently recovered from respiratory infection.  Yesterday she began coughing and having a sharp pain under her left breast.  Now the pain is there just when she coughs.

## 2016-04-10 NOTE — ED Provider Notes (Signed)
[facility]PT Provider Note   CSN: [id number] Arrival date & time: 04/10/16  1522     History   Chief Complaint Chief Complaint  Patient presents with  . Cough  . Chest Pain    HPI Laura Ashley is a 26 y.o. female.  HPI Laura IdolShelise Rau is a 26 y.o. female with history of hypertension, presents to emergency department complaining of fever, chills, sore throat, congestion, nausea, cough, since yesterday. She states that she is having some pain in her chest with coughing. She has not taken any medications prior to coming in. She denies any sick contacts but states she does ride the bus. Denies any neck pain or stiffness. She does admit to headache. No photophobia. No difficulty breathing or swallowing. Nothing is making her symptoms better or worse. No other complaints.  Past Medical History:  Diagnosis Date  . Medical history non-contributory     Patient Active Problem List   Diagnosis Date Noted  . Morbidly obese (HCC) 02/28/2014  . Hypertension 02/28/2014  . Dysfunctional uterine bleeding 02/28/2014    Past Surgical History:  Procedure Laterality Date  . NO PAST SURGERIES      OB History    Gravida Para Term Preterm AB Living   2       2 0   SAB TAB Ectopic Multiple Live Births   2               Home Medications    Prior to Admission medications   Medication Sig Start Date End Date Taking? Authorizing Provider  azithromycin (ZITHROMAX) 250 MG tablet Take 1 tablet (250 mg total) by mouth daily. Take first 2 tablets together, then 1 every day until finished. 03/11/16   Eyvonne MechanicJeffrey Hedges, PA-C  hydrochlorothiazide (HYDRODIURIL) 25 MG tablet Take 1 tablet (25 mg total) by mouth daily. Patient not taking: Reported on 07/08/2014 02/28/14   Levie HeritageJacob J Stinson, DO  metFORMIN (GLUCOPHAGE) 500 MG tablet Take 1 tablet (500 mg total) by mouth 2 (two) times daily with a meal. 07/22/14   Levie HeritageJacob J Stinson, DO  norgestimate-ethinyl estradiol (ORTHO-CYCLEN,SPRINTEC,PREVIFEM)  0.25-35 MG-MCG tablet Take 1 tablet by mouth daily. Patient not taking: Reported on 07/08/2014 03/04/14   Levie HeritageJacob J Stinson, DO    Family History Family History  Problem Relation Age of Onset  . Hypertension Father   . Diabetes Other     Social History Social History  Substance Use Topics  . Smoking status: Never Smoker  . Smokeless tobacco: Never Used  . Alcohol use No     Allergies   Penicillins   Review of Systems Review of Systems  Constitutional: Positive for appetite change, chills and fever.  HENT: Positive for congestion and sore throat.   Respiratory: Positive for cough. Negative for chest tightness and shortness of breath.   Cardiovascular: Positive for chest pain. Negative for palpitations and leg swelling.  Gastrointestinal: Positive for nausea. Negative for abdominal pain, diarrhea and vomiting.  Genitourinary: Negative for dysuria, flank pain and pelvic pain.  Musculoskeletal: Negative for arthralgias, myalgias, neck pain and neck stiffness.  Skin: Negative for rash.  Neurological: Positive for headaches. Negative for dizziness and weakness.  All other systems reviewed and are negative.    Physical Exam Updated Vital Signs BP 145/92 (BP Location: Left Arm)   Pulse 93   Temp 99.4 F (37.4 C) (Oral)   Resp 18   Ht 5\' 4"  (1.626 m)   Wt 122.5 kg   LMP 01/22/2016   SpO2 99%  BMI 46.35 kg/m   Physical Exam  Constitutional: She is oriented to person, place, and time. She appears well-developed and well-nourished. No distress.  HENT:  Head: Normocephalic and atraumatic.  Right Ear: Tympanic membrane, external ear and ear canal normal.  Left Ear: Tympanic membrane, external ear and ear canal normal.  Nose: Mucosal edema and rhinorrhea present.  Mouth/Throat: Uvula is midline and mucous membranes are normal. Posterior oropharyngeal erythema present. No oropharyngeal exudate, posterior oropharyngeal edema or tonsillar abscesses.  Eyes: Conjunctivae are  normal.  Neck: Neck supple.  Cardiovascular: Normal rate, regular rhythm, normal heart sounds and intact distal pulses.   Pulmonary/Chest: Effort normal and breath sounds normal. No respiratory distress. She has no wheezes. She has no rales.  Abdominal: Soft. Bowel sounds are normal. She exhibits no distension. There is no tenderness. There is no rebound.  Musculoskeletal: Normal range of motion. She exhibits no edema.  Neurological: She is alert and oriented to person, place, and time.  Skin: Skin is warm and dry.  Psychiatric: She has a normal mood and affect. Her behavior is normal.  Nursing note and vitals reviewed.    ED Treatments / Results  Labs (all labs ordered are listed, but only abnormal results are displayed) Labs Reviewed - No data to display  EKG  EKG Interpretation None       Radiology Dg Chest 2 View  Result Date: 04/10/2016 CLINICAL DATA:  Recent recovery from respiratory infection. Began coughing with sharp left breast pain yesterday. EXAM: CHEST  2 VIEW COMPARISON:  03/11/2016 FINDINGS: Lungs are hypoinflated without focal airspace consolidation or effusion. Cardiomediastinal silhouette and remainder of the exam is unchanged. IMPRESSION: No active cardiopulmonary disease. Electronically Signed   By: Elberta Fortis M.D.   On: 04/10/2016 19:20    Procedures Procedures (including critical care time)  Medications Ordered in ED Medications - No data to display   Initial Impression / Assessment and Plan / ED Course  I have reviewed the triage vital signs and the nursing notes.  Pertinent labs & imaging results that were available during my care of the patient were reviewed by me and considered in my medical decision making (see chart for details).   patient in emergency room with flulike symptoms. She is tolerating oral fluids. She is requesting Tamiflu, explained pros and cons of taking Tamiflu, and advised her to fill the prescription if she still believes  she wants to do so. Will also give prescription for Zofran for her nausea and for Tessalon. Home with a work note, rest, fluids, follow-up. Vital signs are normal. Exam unremarkable. She is nontoxic-appearing.    Final Clinical Impressions(s) / ED Diagnoses   Final diagnoses:  Viral URI with cough    New Prescriptions New Prescriptions   BENZONATATE (TESSALON) 100 MG CAPSULE    Take 1 capsule (100 mg total) by mouth every 8 (eight) hours.   ONDANSETRON (ZOFRAN) 4 MG TABLET    Take 1 tablet (4 mg total) by mouth every 6 (six) hours.   OSELTAMIVIR (TAMIFLU) 75 MG CAPSULE    Take 1 capsule (75 mg total) by mouth every 12 (twelve) hours.     Jaynie Crumble, PA-C 04/10/16 1955    Loren Racer, MD 04/11/16 0110

## 2016-09-02 ENCOUNTER — Encounter (HOSPITAL_COMMUNITY): Payer: Self-pay

## 2016-09-02 ENCOUNTER — Inpatient Hospital Stay (HOSPITAL_COMMUNITY)
Admission: AD | Admit: 2016-09-02 | Discharge: 2016-09-02 | Disposition: A | Discharge status: Home / Self Care | Payer: Self-pay | Source: Ambulatory Visit | Attending: Obstetrics & Gynecology | Admitting: Obstetrics & Gynecology

## 2016-09-02 DIAGNOSIS — Z833 Family history of diabetes mellitus: Secondary | ICD-10-CM | POA: Insufficient documentation

## 2016-09-02 DIAGNOSIS — N939 Abnormal uterine and vaginal bleeding, unspecified: Secondary | ICD-10-CM

## 2016-09-02 DIAGNOSIS — R1030 Lower abdominal pain, unspecified: Secondary | ICD-10-CM | POA: Insufficient documentation

## 2016-09-02 DIAGNOSIS — A599 Trichomoniasis, unspecified: Secondary | ICD-10-CM

## 2016-09-02 DIAGNOSIS — E282 Polycystic ovarian syndrome: Secondary | ICD-10-CM | POA: Insufficient documentation

## 2016-09-02 DIAGNOSIS — N938 Other specified abnormal uterine and vaginal bleeding: Secondary | ICD-10-CM

## 2016-09-02 DIAGNOSIS — Z79899 Other long term (current) drug therapy: Secondary | ICD-10-CM | POA: Insufficient documentation

## 2016-09-02 DIAGNOSIS — Z880 Allergy status to penicillin: Secondary | ICD-10-CM | POA: Insufficient documentation

## 2016-09-02 DIAGNOSIS — Z8249 Family history of ischemic heart disease and other diseases of the circulatory system: Secondary | ICD-10-CM | POA: Insufficient documentation

## 2016-09-02 HISTORY — DX: Polycystic ovarian syndrome: E28.2

## 2016-09-02 LAB — URINALYSIS, ROUTINE W REFLEX MICROSCOPIC
Bilirubin Urine: NEGATIVE
GLUCOSE, UA: NEGATIVE mg/dL
Ketones, ur: NEGATIVE mg/dL
NITRITE: NEGATIVE
PROTEIN: NEGATIVE mg/dL
SPECIFIC GRAVITY, URINE: 1.019 (ref 1.005–1.030)
pH: 5 (ref 5.0–8.0)

## 2016-09-02 LAB — WET PREP, GENITAL
Sperm: NONE SEEN
Yeast Wet Prep HPF POC: NONE SEEN

## 2016-09-02 LAB — POCT PREGNANCY, URINE: Preg Test, Ur: NEGATIVE

## 2016-09-02 MED ORDER — METRONIDAZOLE 500 MG PO TABS
2000.0000 mg | ORAL_TABLET | Freq: Once | ORAL | Status: AC
Start: 2016-09-02 — End: 2016-09-02
  Administered 2016-09-02: 2000 mg via ORAL
  Filled 2016-09-02: qty 4

## 2016-09-02 NOTE — Discharge Instructions (Signed)

## 2016-09-02 NOTE — MAU Provider Note (Signed)
History   [id number]   Chief Complaint  Patient presents with  . Abdominal Cramping    HPI Laura Ashley is a 26 y.o. female  G2P0020 here with lower abdominal cramping for the past 3 days.  Pain is described as a sharp pain rated a 7/10.  Intermittent pain in nature.  Bleeding follows the intense pain.  Irregular menses.  LMP 04/26/16, lasted approximately 2 months.  Bleeding described as period.  Utilized 3 pads a day.  Hx PCOS .  Recommended Metformin in past, discontinued due to thinking just for diabetes.  She also reports vaginal itching.  No report of vaginal odor.     No LMP recorded. Patient is not currently having periods (Reason: Irregular Periods).  OB History  Gravida Para Term Preterm AB Living  2       2 0  SAB TAB Ectopic Multiple Live Births  2            # Outcome Date GA Lbr Len/2nd Weight Sex Delivery Anes PTL Lv  2 SAB           1 SAB               Past Medical History:  Diagnosis Date  . Medical history non-contributory   . PCOS (polycystic ovarian syndrome)     Family History  Problem Relation Age of Onset  . Hypertension Father   . Diabetes Other     Social History   Social History  . Marital status: Single    Spouse name: N/A  . Number of children: N/A  . Years of education: N/A   Social History Main Topics  . Smoking status: Never Smoker  . Smokeless tobacco: Never Used  . Alcohol use No  . Drug use: No  . Sexual activity: Yes   Other Topics Concern  . None   Social History Narrative  . None    Allergies  Allergen Reactions  . Penicillins Rash    Has patient had a PCN reaction causing immediate rash, facial/tongue/throat swelling, SOB or lightheadedness with hypotension: Yes Has patient had a PCN reaction causing severe rash involving mucus membranes or skin necrosis: Yes Has patient had a PCN reaction that required hospitalization: No Has patient had a PCN reaction occurring within the last 10 years: No If all of the  above answers are "NO", then may proceed with Cephalosporin use.    No current facility-administered medications on file prior to encounter.    Current Outpatient Prescriptions on File Prior to Encounter  Medication Sig Dispense Refill  . ondansetron (ZOFRAN) 4 MG tablet Take 1 tablet (4 mg total) by mouth every 6 (six) hours. (Patient not taking: Reported on 09/02/2016) 12 tablet 0     Review of Systems  Constitutional: Negative for fever.  Gastrointestinal: Negative for nausea and vomiting.  Genitourinary: Positive for pelvic pain, vaginal bleeding, vaginal discharge and vaginal pain. Negative for dysuria and hematuria.  All other systems reviewed and are negative.    Physical Exam   Vitals:   09/02/16 1747  BP: (!) 145/89  Pulse: 69    Physical Exam  Constitutional: She is oriented to person, place, and time. She appears well-developed and well-nourished.  HENT:  Head: Normocephalic.  Eyes: Pupils are equal, round, and reactive to light.  Neck: Normal range of motion. Neck supple.  Cardiovascular: Normal rate, regular rhythm and normal heart sounds.   Respiratory: Effort normal and breath sounds normal.  GI: Soft. She exhibits  no mass. There is tenderness. There is no guarding.  Genitourinary: Vaginal discharge (white, creamy; +fishy odor) found.  Genitourinary Comments: Negative cervical motion tenderness  Neurological: She is alert and oriented to person, place, and time. She has normal reflexes.  Skin: Skin is warm and dry.    MAU Course  Procedures Results for orders placed or performed during the hospital encounter of 09/02/16 (from the past 24 hour(s))  Urinalysis, Routine w reflex microscopic     Status: Abnormal   Collection Time: 09/02/16  3:00 PM  Result Value Ref Range   Color, Urine YELLOW YELLOW   APPearance HAZY (A) CLEAR   Specific Gravity, Urine 1.019 1.005 - 1.030   pH 5.0 5.0 - 8.0   Glucose, UA NEGATIVE NEGATIVE mg/dL   Hgb urine dipstick  LARGE (A) NEGATIVE   Bilirubin Urine NEGATIVE NEGATIVE   Ketones, ur NEGATIVE NEGATIVE mg/dL   Protein, ur NEGATIVE NEGATIVE mg/dL   Nitrite NEGATIVE NEGATIVE   Leukocytes, UA SMALL (A) NEGATIVE   RBC / HPF 6-30 0 - 5 RBC/hpf   WBC, UA 6-30 0 - 5 WBC/hpf   Bacteria, UA FEW (A) NONE SEEN   Squamous Epithelial / LPF 6-30 (A) NONE SEEN   Mucous PRESENT   Pregnancy, urine POC     Status: None   Collection Time: 09/02/16  3:12 PM  Result Value Ref Range   Preg Test, Ur NEGATIVE NEGATIVE  Wet prep, genital     Status: Abnormal   Collection Time: 09/02/16  3:45 PM  Result Value Ref Range   Yeast Wet Prep HPF POC NONE SEEN NONE SEEN   Trich, Wet Prep PRESENT (A) NONE SEEN   Clue Cells Wet Prep HPF POC PRESENT (A) NONE SEEN   WBC, Wet Prep HPF POC FEW (A) NONE SEEN   Sperm NONE SEEN    2 GM Flagyl PO given in MAU   Assessment and Plan  Abnormal Uterine Bleeding Trichomoniasis Hx PCOS  Plan: Declined partner [place] for trichomoniasis; Explained partner will need to be treated Declines hormonal treatment for AUD; desires to see provider for possible PCOS management (Metformin) Follow-up in outpatient office   Marlis EdelsonKarim, Charity Tessier N, [place] 09/02/2016 5:47 PM

## 2016-09-02 NOTE — MAU Note (Signed)
Patient has been having lower abdominal cramping for the past 3 days. Patient is also having vaginal itching. Denies any order.

## 2016-09-03 LAB — GC/CHLAMYDIA PROBE AMP (~~LOC~~) NOT AT ARMC
CHLAMYDIA, DNA PROBE: NEGATIVE
NEISSERIA GONORRHEA: NEGATIVE

## 2016-09-16 ENCOUNTER — Ambulatory Visit (HOSPITAL_COMMUNITY)
Admission: RE | Admit: 2016-09-16 | Discharge: 2016-09-16 | Disposition: A | Discharge status: Home / Self Care | Payer: Self-pay | Source: Ambulatory Visit | Attending: Family | Admitting: Family

## 2016-09-16 DIAGNOSIS — N938 Other specified abnormal uterine and vaginal bleeding: Secondary | ICD-10-CM | POA: Insufficient documentation

## 2016-10-05 ENCOUNTER — Telehealth: Payer: Self-pay

## 2016-10-05 NOTE — Telephone Encounter (Signed)
Patient called nurse line requesting US results. Per chart review patient's US was normal. Called patient and got voicemail.

## 2016-10-05 NOTE — Telephone Encounter (Signed)
Patient called the for her US results. I explained to patient that her US was normal. Patient was also concerned about spotting when she has a bowel movement. Patient denies being constipated and stated that she only sees the blood when she wipes after a bowel movement. Patient has appointment scheduled for 8/3. I advised patient to speak with the doctor when she comes in if she is still having problems. Patient verbalized understanding.

## 2016-10-15 ENCOUNTER — Encounter: Payer: Self-pay | Admitting: Obstetrics and Gynecology

## 2017-01-05 ENCOUNTER — Encounter (HOSPITAL_COMMUNITY): Payer: Self-pay | Admitting: Emergency Medicine

## 2017-01-05 ENCOUNTER — Emergency Department (HOSPITAL_COMMUNITY)
Admission: EM | Admit: 2017-01-05 | Discharge: 2017-01-05 | Disposition: A | Discharge status: Home / Self Care | Payer: Self-pay | Attending: Emergency Medicine | Admitting: Emergency Medicine

## 2017-01-05 DIAGNOSIS — R0981 Nasal congestion: Secondary | ICD-10-CM | POA: Insufficient documentation

## 2017-01-05 DIAGNOSIS — J029 Acute pharyngitis, unspecified: Secondary | ICD-10-CM

## 2017-01-05 DIAGNOSIS — I10 Essential (primary) hypertension: Secondary | ICD-10-CM | POA: Insufficient documentation

## 2017-01-05 DIAGNOSIS — R509 Fever, unspecified: Secondary | ICD-10-CM | POA: Insufficient documentation

## 2017-01-05 LAB — RAPID STREP SCREEN (MED CTR MEBANE ONLY): Streptococcus, Group A Screen (Direct): NEGATIVE

## 2017-01-05 MED ORDER — BENZONATATE 100 MG PO CAPS: 100.0000 mg | ORAL_CAPSULE | Freq: Two times a day (BID) | ORAL | 0 refills | Status: DC | PRN

## 2017-01-05 MED ORDER — ACETAMINOPHEN 325 MG PO TABS
650.0000 mg | ORAL_TABLET | Freq: Once | ORAL | Status: AC | PRN
  Administered 2017-01-05: 650 mg via ORAL
  Filled 2017-01-05: qty 2

## 2017-01-05 MED ORDER — AZITHROMYCIN 250 MG PO TABS: 500.0000 mg | ORAL_TABLET | Freq: Every day | ORAL | 0 refills | Status: AC

## 2017-01-05 NOTE — ED Triage Notes (Signed)
Pt c/o sore throat x 5 days and sinus congestion, cough and L ear pain.

## 2017-01-05 NOTE — Discharge Instructions (Signed)
Take antibiotics as prescribed.  Encourage nasal rinsing for decongestant and watch salt water swishing to help relieve the pain in the back of throat.  He may take ibuprofen as well for pain.  Follow-up with your primary care doctor if your symptoms do not improve.  Return to the emergency department if you experience severe worsening of her symptoms, increased fever, inability to swallow or difficulty breathing.

## 2017-01-05 NOTE — ED Provider Notes (Signed)
[facility]-EMERGENCY DEPT Provider Note   CSN: [id number] Arrival date & time: 01/05/17  1001     History   Chief Complaint Chief Complaint  Patient presents with  . Sore Throat  . Facial Pain  . Nasal Congestion    HPI Laura Ashley is a 26 y.o. female with a past medical history of HTN, PCO S who presents to the ED today complaining of sore throat, nasal congestion, cough and chills.  Patient reports that 4 days ago she went to a haunted house and screamed for a prolonged period of time.  The following day she woke up with a sore throat and she attributed to last night activities.  However heard throat continued to have pain and she began having pain with swallowing.  The following day she began sneezing and having nasal congestion.  She also reports chills and subjective fevers at home.  She feels as if her neck and tonsils are very swollen.  She has had difficulty eating her food and swallowing pills.  She tried taking DayQuil 2 days ago which she states improved her nasal congestion but her throat was so sore she could not swallow the pills anymore.  She denies any sick contacts.  Denies any difficulty breathing.  Patient is a smoker and smokes about a pack a day.  Cough is dry and nonproductive.  HPI  Past Medical History:  Diagnosis Date  . Medical history non-contributory   . PCOS (polycystic ovarian syndrome)     Patient Active Problem List   Diagnosis Date Noted  . Morbidly obese (HCC) 02/28/2014  . Hypertension 02/28/2014  . Dysfunctional uterine bleeding 02/28/2014    Past Surgical History:  Procedure Laterality Date  . NO PAST SURGERIES      OB History    Gravida Para Term Preterm AB Living   2       2 0   SAB TAB Ectopic Multiple Live Births   2               Home Medications    Prior to Admission medications   Medication Sig Start Date End Date Taking? Authorizing Provider  azithromycin (ZITHROMAX) 250 MG tablet Take 2  tablets (500 mg total) by mouth daily. Take first 2 tablets together, then 1 every day until finished. 01/05/17 01/08/17  Loralei Radcliffe Tripp, PA-C  benzonatate (TESSALON) 100 MG capsule Take 1 capsule (100 mg total) by mouth 2 (two) times daily as needed for cough. 01/05/17   Deziah Renwick, Lester Kinsman, PA-C    Family History Family History  Problem Relation Age of Onset  . Hypertension Father   . Diabetes Other     Social History Social History  Substance Use Topics  . Smoking status: Never Smoker  . Smokeless tobacco: Never Used  . Alcohol use No     Allergies   Penicillins   Review of Systems Review of Systems  All other systems reviewed and are negative.    Physical Exam Updated Vital Signs BP (!) 149/91 (BP Location: Left Arm)   Pulse 85   Temp 98.8 F (37.1 C) (Oral)   Resp 16   Ht 5\' 4"  (1.626 m)   Wt 120.2 kg (265 lb)   SpO2 98%   BMI 45.49 kg/m   Physical Exam  Constitutional: She is oriented to person, place, and time. She appears well-developed and well-nourished. No distress.  HENT:  Head: Normocephalic and atraumatic.  Right Ear: Tympanic membrane and external ear  normal.  Left Ear: Tympanic membrane and external ear normal.  Nose: Mucosal edema present.  Mouth/Throat: Uvula is midline. No uvula swelling. Oropharyngeal exudate, posterior oropharyngeal edema and posterior oropharyngeal erythema present. No tonsillar abscesses.  Eyes: Conjunctivae are normal. Right eye exhibits no discharge. Left eye exhibits no discharge. No scleral icterus.  Neck: Normal range of motion.  Cardiovascular: Normal rate.   Pulmonary/Chest: Effort normal and breath sounds normal.  Lymphadenopathy:    She has cervical adenopathy.  Neurological: She is alert and oriented to person, place, and time. Coordination normal.  Skin: Skin is warm and dry. No rash noted. She is not diaphoretic. No erythema. No pallor.  Psychiatric: She has a normal mood and affect. Her  behavior is normal.  Nursing note and vitals reviewed.    ED Treatments / Results  Labs (all labs ordered are listed, but only abnormal results are displayed) Labs Reviewed  [facility]RE[facility]CREEN (NOT [facility])  CULTURE, GROUP A STREP [facility])    EKG  EKG Interpretation None       Radiology No results found.  Procedures Procedures (including critical care time)  Medications Ordered in ED Medications  acetaminophen (TYLENOL) tablet 650 mg (650 mg Oral Given 01/05/17 1029)     Initial Impression / Assessment and Plan / ED Course  I have reviewed the triage vital signs and the nursing notes.  Pertinent labs & imaging results that were available during my care of the patient were reviewed by me and considered in my medical decision making (see chart for details).     Otherwise healthy 26 year old female presented to the ED today complaining of sore throat, dysphagia, nasal congestion and subjective fevers at home.  She is in no apparent distress and her vitals are currently stable.  Rapid strep test was negative however on exam she has significant oral pharyngeal and tonsillar exudate, erythema.  Uvula is midline does not appear to have a tonsillar abscess.  She does meet Cent[facility] criteria so we will treat with oral azithromycin as she is penicillin allergic.  Return precautions outlined in patient discharge instructions.  Final Clinical Impressions(s) / ED Diagnoses   Final diagnoses:  Pharyngitis, unspecified etiology    New Prescriptions New Prescriptions   AZITHROMYCIN (ZITHROMAX) 250 MG TABLET    Take 2 tablets (500 mg total) by mouth daily. Take first 2 tablets together, then 1 every day until finished.   BENZONATATE (TESSALON) 100 MG CAPSULE    Take 1 capsule (100 mg total) by mouth 2 (two) times daily as needed for cough.     Dub MikesDowless, Varnell Donate Tripp, PA-C 01/05/17 1125    Azalia Bilisampos, Kevin, MD 01/05/17 [phone]

## 2017-01-05 NOTE — ED Notes (Signed)
Bed: WTR5 Expected date:  Expected time:  Means of arrival:  Comments: 

## 2017-01-07 LAB — CULTURE, GROUP A STREP (THRC)

## 2017-03-20 IMAGING — CR DG CHEST 2V
2 series · 2 of 2 positions shown · non-contrast
Comparison: 03/11/2016

CLINICAL DATA: Recent recovery from respiratory infection. Began
coughing with sharp left breast pain yesterday.

EXAM:
CHEST  2 VIEW

[w chest pa]
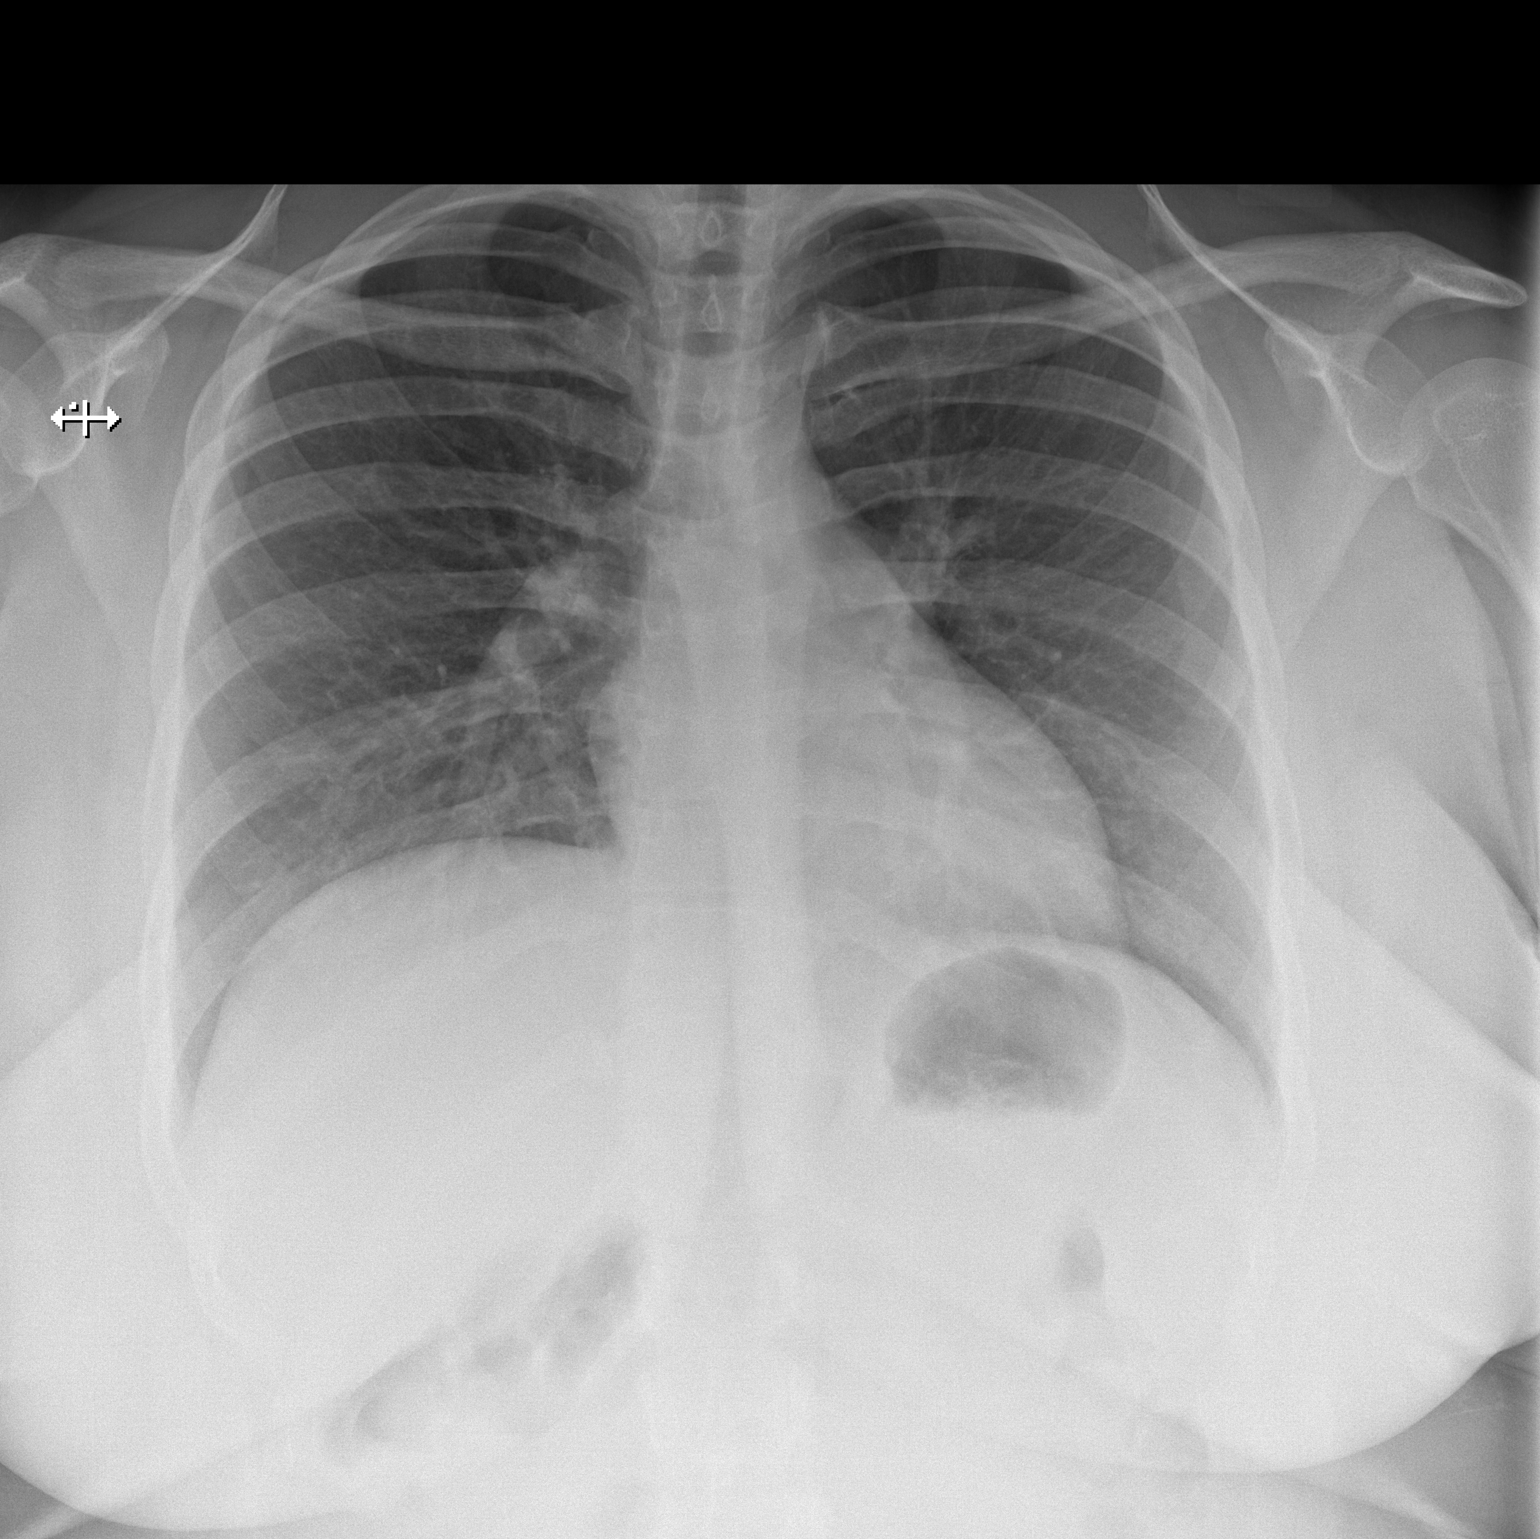

[w chest lat]
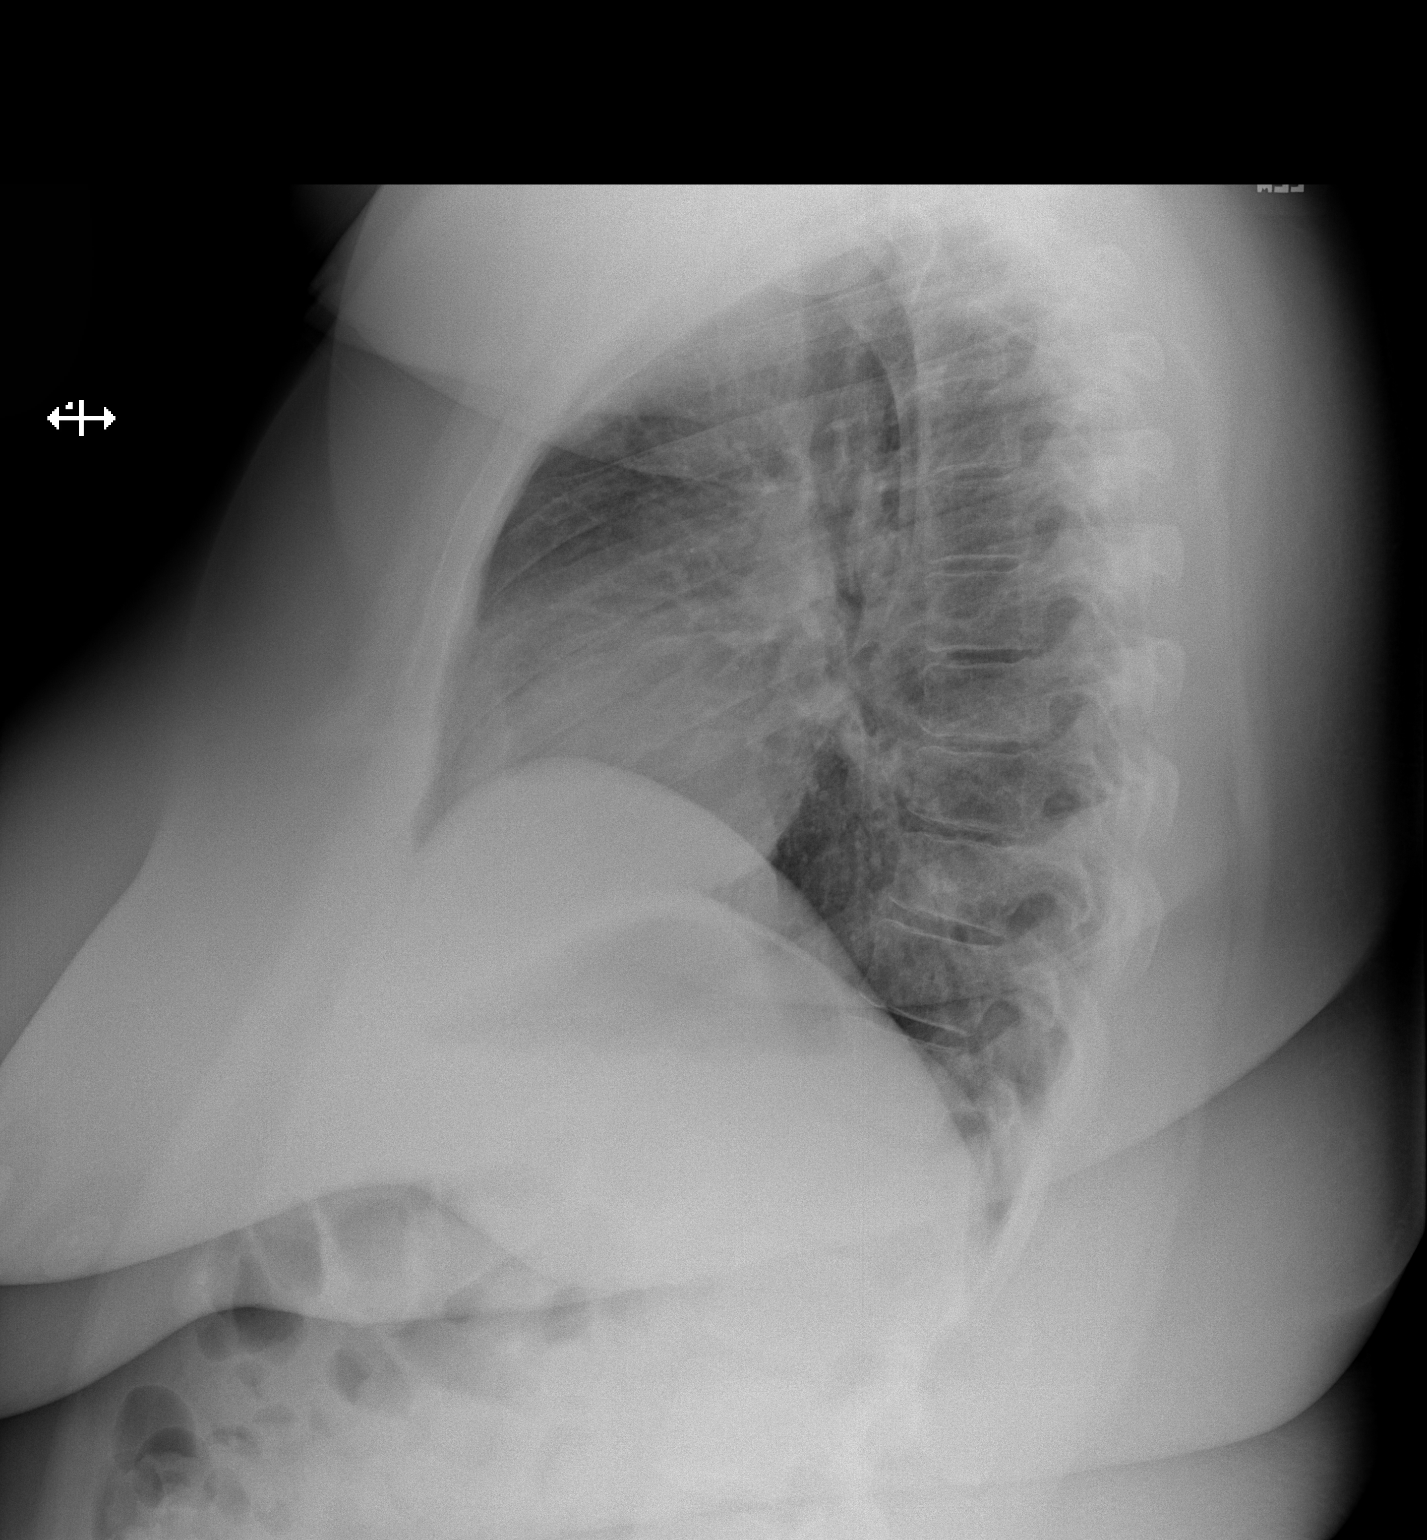

[2 of 2 positions shown; findings below may reference images not displayed]

FINDINGS: Lungs are hypoinflated without focal airspace consolidation or
effusion. Cardiomediastinal silhouette and remainder of the exam is
unchanged.
IMPRESSION: No active cardiopulmonary disease.

## 2017-03-23 ENCOUNTER — Other Ambulatory Visit (HOSPITAL_COMMUNITY): Payer: Self-pay | Admitting: *Deleted

## 2017-03-23 DIAGNOSIS — N6452 Nipple discharge: Secondary | ICD-10-CM

## 2017-03-25 ENCOUNTER — Other Ambulatory Visit (HOSPITAL_COMMUNITY): Payer: Self-pay | Admitting: *Deleted

## 2017-03-25 DIAGNOSIS — N6452 Nipple discharge: Secondary | ICD-10-CM

## 2017-04-07 ENCOUNTER — Ambulatory Visit (HOSPITAL_COMMUNITY): Payer: Self-pay

## 2017-04-08 ENCOUNTER — Inpatient Hospital Stay: Admission: RE | Admit: 2017-04-08 | Payer: Self-pay | Source: Ambulatory Visit

## 2017-04-11 ENCOUNTER — Ambulatory Visit: Payer: Self-pay | Admitting: Family Medicine

## 2017-04-11 ENCOUNTER — Telehealth: Payer: Self-pay | Admitting: *Deleted

## 2017-04-11 NOTE — Telephone Encounter (Signed)
[id number] discharge from right nipple. Per chart review also missed breast ultrasound scheduled by [facility].  Per discussion with Dr.Stinson needs to have breast ultrasound rescheduled and then if still needs to see a provider can reschedule.  I called Laura Ashley and informed she missed her appt with us and we also noted she missed breast us.  Informed her she needs to have breast ultrasound first and then if still needs to see a provider can call us back to be seen.  I gave her the number to reschedule the breast ultrasound and our appt. She voices understanding.

## 2017-04-21 ENCOUNTER — Encounter (HOSPITAL_COMMUNITY): Payer: Self-pay | Admitting: Emergency Medicine

## 2017-04-21 ENCOUNTER — Other Ambulatory Visit: Payer: Self-pay

## 2017-04-21 ENCOUNTER — Emergency Department (HOSPITAL_COMMUNITY)
Admission: EM | Admit: 2017-04-21 | Discharge: 2017-04-21 | Disposition: A | Discharge status: Home / Self Care | Payer: Self-pay | Attending: Emergency Medicine | Admitting: Emergency Medicine

## 2017-04-21 DIAGNOSIS — R05 Cough: Secondary | ICD-10-CM | POA: Insufficient documentation

## 2017-04-21 DIAGNOSIS — J020 Streptococcal pharyngitis: Secondary | ICD-10-CM | POA: Insufficient documentation

## 2017-04-21 DIAGNOSIS — H9203 Otalgia, bilateral: Secondary | ICD-10-CM | POA: Insufficient documentation

## 2017-04-21 DIAGNOSIS — Z880 Allergy status to penicillin: Secondary | ICD-10-CM | POA: Insufficient documentation

## 2017-04-21 LAB — RAPID STREP SCREEN (MED CTR MEBANE ONLY): Streptococcus, Group A Screen (Direct): POSITIVE — AB

## 2017-04-21 MED ORDER — OXYCODONE HCL 5 MG PO TABS
5.0000 mg | ORAL_TABLET | Freq: Once | ORAL | Status: AC
  Administered 2017-04-21: 5 mg via ORAL
  Filled 2017-04-21: qty 1

## 2017-04-21 MED ORDER — IBUPROFEN 200 MG PO TABS
600.0000 mg | ORAL_TABLET | Freq: Once | ORAL | Status: AC
  Administered 2017-04-21: 600 mg via ORAL
  Filled 2017-04-21: qty 3

## 2017-04-21 MED ORDER — DEXAMETHASONE 1 MG/ML PO CONC
10.0000 mg | Freq: Once | ORAL | Status: AC
  Administered 2017-04-21: 10 mg via ORAL
  Filled 2017-04-21: qty 10

## 2017-04-21 MED ORDER — CLINDAMYCIN HCL 300 MG PO CAPS: 300.0000 mg | ORAL_CAPSULE | Freq: Three times a day (TID) | ORAL | 0 refills | Status: DC

## 2017-04-21 MED ORDER — ACETAMINOPHEN 325 MG PO TABS
650.0000 mg | ORAL_TABLET | Freq: Once | ORAL | Status: AC | PRN
  Administered 2017-04-21: 650 mg via ORAL
  Filled 2017-04-21: qty 2

## 2017-04-21 NOTE — ED Provider Notes (Signed)
[facility]-EMERGENCY DEPT Provider Note   CSN: [id number] Arrival date & time: 04/21/17  0848     History   Chief Complaint Chief Complaint  Patient presents with  . Cough  . Sore Throat  . Otalgia    HPI Laura Ashley is a 27 y.o. female.  HPI   27 year old female with sore throat.  Symptom onset 2 days ago.  Progressively worsening, particularly overnight last night.  Pain is constant.  Worse with swallowing.  Also pain in bilateral ears.  No fevers or chills.  Occasional nonproductive cough.  She has been taking ibuprofen with some mild improvement.  Past Medical History:  Diagnosis Date  . Medical history non-contributory   . PCOS (polycystic ovarian syndrome)     Patient Active Problem List   Diagnosis Date Noted  . Morbidly obese (HCC) 02/28/2014  . Hypertension 02/28/2014  . Dysfunctional uterine bleeding 02/28/2014    Past Surgical History:  Procedure Laterality Date  . NO PAST SURGERIES      OB History    Gravida Para Term Preterm AB Living   2       2 0   SAB TAB Ectopic Multiple Live Births   2               Home Medications    Prior to Admission medications   Medication Sig Start Date End Date Taking? Authorizing Provider  benzonatate (TESSALON) 100 MG capsule Take 1 capsule (100 mg total) by mouth 2 (two) times daily as needed for cough. 01/05/17   Dowless, Lester KinsmanSamantha Tripp, PA-C    Family History Family History  Problem Relation Age of Onset  . Hypertension Father   . Diabetes Other     Social History Social History   Tobacco Use  . Smoking status: Never Smoker  . Smokeless tobacco: Never Used  Substance Use Topics  . Alcohol use: No  . Drug use: No     Allergies   Penicillins   Review of Systems Review of Systems  All systems reviewed and negative, other than as noted in HPI.   Physical Exam Updated Vital Signs BP (!) 147/96 (BP Location: Left Arm)   Pulse 89   Temp 98.8 F (37.1 C) (Oral)    Resp 18   Ht 5\' 4"  (1.626 m)   Wt 106.6 kg (235 lb)   SpO2 98%   BMI 40.34 kg/m   Physical Exam  Constitutional: She appears well-developed and well-nourished. No distress.  HENT:  Head: Normocephalic and atraumatic.  Eyes: Conjunctivae are normal. Right eye exhibits no discharge. Left eye exhibits no discharge.  Pharyngitis without significant exudate.  Uvula is midline.  Handling secretions.  Normal sounding voice.  Neck is supple.  No stridor.  Neck: Neck supple.  Cardiovascular: Normal rate, regular rhythm and normal heart sounds. Exam reveals no gallop and no friction rub.  No murmur heard. Pulmonary/Chest: Effort normal and breath sounds normal. No respiratory distress.  Abdominal: Soft. She exhibits no distension. There is no tenderness.  Musculoskeletal: She exhibits no edema or tenderness.  Neurological: She is alert.  Skin: Skin is warm and dry.  Psychiatric: She has a normal mood and affect. Her behavior is normal. Thought content normal.  Nursing note and vitals reviewed.    ED Treatments / Results  Labs (all labs ordered are listed, but only abnormal results are displayed) Labs Reviewed  RAPID STREP SCREEN (NOT AT [facility])    EKG  EKG Interpretation None  Radiology No results found.  Procedures Procedures (including critical care time)  Medications Ordered in ED Medications  dexamethasone (DECADRON) 1 MG/ML solution 10 mg (not administered)  oxyCODONE (Oxy IR/ROXICODONE) immediate release tablet 5 mg (not administered)  ibuprofen (ADVIL,MOTRIN) tablet 600 mg (not administered)  acetaminophen (TYLENOL) tablet 650 mg (650 mg Oral Given 04/21/17 0926)     Initial Impression / Assessment and Plan / ED Course  I have reviewed the triage vital signs and the nursing notes.  Pertinent labs & imaging results that were available during my care of the patient were reviewed by me and considered in my medical decision making (see chart for details).      27 year old female with sore throat.  Suspect viral pharyngitis, but rapid strep was performed.  Symptomatic treatment. No significant concern for serious deep space infection.  Antibiotics if rapid strep positive.  Final Clinical Impressions(s) / ED Diagnoses   Final diagnoses:  Strep pharyngitis    ED Discharge Orders    None       Raeford Razor, MD 04/21/17 [phone]

## 2017-04-21 NOTE — ED Triage Notes (Signed)
Patient complaining of sore throat, ear pain, and congestion. Patient states that it is hard for her to swallow.

## 2017-11-22 ENCOUNTER — Encounter (HOSPITAL_COMMUNITY): Payer: Self-pay | Admitting: Emergency Medicine

## 2017-11-22 ENCOUNTER — Ambulatory Visit (HOSPITAL_COMMUNITY)
Admission: EM | Admit: 2017-11-22 | Discharge: 2017-11-22 | Disposition: A | Discharge status: Home / Self Care | Payer: Self-pay | Attending: Family Medicine | Admitting: Family Medicine

## 2017-11-22 DIAGNOSIS — R110 Nausea: Secondary | ICD-10-CM

## 2017-11-22 MED ORDER — ONDANSETRON 4 MG PO TBDP: 4.0000 mg | ORAL_TABLET | Freq: Three times a day (TID) | ORAL | 0 refills | Status: DC | PRN

## 2017-11-22 NOTE — ED Provider Notes (Signed)
[facility]    CSN: [id number] Arrival date & time: 11/22/17  1415     History   Chief Complaint Chief Complaint  Patient presents with  . Abdominal Pain    HPI Laura Ashley is a 27 y.o. female.   27 year old female comes in for nausea after EtOH use.  States 1 to 2 weeks ago, had similar episodes after drinking.  States had liquor 3 days ago, and woke up with nausea that has not resolved.  She denies vomiting.  Denies abdominal pain.  Has had a few episodes of loose stools that is not watery, and without melena or hematochezia.  She has been tolerating fluids without problems, decreased appetite due to nausea.  States on "good day" she drinks liquor 3 times a week, and on a "bad day", she drinks "too much".  She denies any chronic NSAID use, denies drug use. Denies fever, chills, night sweats.      Past Medical History:  Diagnosis Date  . Medical history non-contributory   . PCOS (polycystic ovarian syndrome)     Patient Active Problem List   Diagnosis Date Noted  . Morbidly obese (HCC) 02/28/2014  . Hypertension 02/28/2014  . Dysfunctional uterine bleeding 02/28/2014    Past Surgical History:  Procedure Laterality Date  . NO PAST SURGERIES      OB History    Gravida  2   Para      Term      Preterm      AB  2   Living  0     SAB  2   TAB      Ectopic      Multiple      Live Births               Home Medications    Prior to Admission medications   Medication Sig Start Date End Date Taking? Authorizing Provider  ondansetron (ZOFRAN ODT) 4 MG disintegrating tablet Take 1 tablet (4 mg total) by mouth every 8 (eight) hours as needed for nausea or vomiting. 11/22/17   Belinda Fisher, PA-C    Family History Family History  Problem Relation Age of Onset  . Hypertension Father   . Diabetes Other     Social History Social History   Tobacco Use  . Smoking status: Never Smoker  . Smokeless tobacco: Never Used  Substance Use  Topics  . Alcohol use: No  . Drug use: No     Allergies   Penicillins   Review of Systems Review of Systems  Reason unable to perform ROS: See HPI as above.     Physical Exam Triage Vital Signs ED Triage Vitals [11/22/17 1501]  Enc Vitals Group     BP (!) 144/77     Pulse Rate 64     Resp 18     Temp 98.1 F (36.7 C)     Temp Source Oral     SpO2 100 %     Weight      Height      Head Circumference      Peak Flow      Pain Score      Pain Loc      Pain Edu?      Excl. in GC?    No data found.  Updated Vital Signs BP (!) 144/77 (BP Location: Right Arm)   Pulse 64   Temp 98.1 F (36.7 C) (Oral)   Resp 18  SpO2 100%   Physical Exam  Constitutional: She is oriented to person, place, and time. She appears well-developed and well-nourished. No distress.  HENT:  Head: Normocephalic and atraumatic.  Eyes: Pupils are equal, round, and reactive to light. Conjunctivae are normal.  Cardiovascular: Normal rate, regular rhythm and normal heart sounds. Exam reveals no gallop and no friction rub.  No murmur heard. Pulmonary/Chest: Effort normal and breath sounds normal. She has no wheezes. She has no rales.  Abdominal: Soft. Bowel sounds are normal. She exhibits no mass. There is no tenderness. There is no rigidity, no rebound, no guarding and no CVA tenderness.  Neurological: She is alert and oriented to person, place, and time.  Skin: Skin is warm and dry.  Psychiatric: She has a normal mood and affect. Her behavior is normal. Judgment normal.   UC Treatments / Results  Labs (all labs ordered are listed, but only abnormal results are displayed) Labs Reviewed - No data to display  EKG None  Radiology No results found.  Procedures Procedures (including critical care time)  Medications Ordered in UC Medications - No data to display  Initial Impression / Assessment and Plan / UC Course  I have reviewed the triage vital signs and the nursing  notes.  Pertinent labs & imaging results that were available during my care of the patient were reviewed by me and considered in my medical decision making (see chart for details).    No alarming sign on exam.  Patient to refrain from alcohol use for now.  Provide few Zofran's for symptoms.  Bland diet, advance as tolerated.  Return precautions given.  Patient expresses understanding and agrees to plan.  Final Clinical Impressions(s) / UC Diagnoses   Final diagnoses:  Nausea without vomiting    ED Prescriptions    Medication Sig Dispense Auth. Provider   ondansetron (ZOFRAN ODT) 4 MG disintegrating tablet Take 1 tablet (4 mg total) by mouth every 8 (eight) hours as needed for nausea or vomiting. 10 tablet Threasa Alpha, PA-C 11/22/17 1544

## 2017-11-22 NOTE — Discharge Instructions (Signed)
Zofran for nausea and vomiting as needed. Keep hydrated, you urine should be clear to pale yellow in color. Bland diet, advance as tolerated. Monitor for any worsening of symptoms, nausea or vomiting not controlled by medication, worsening abdominal pain, fever, follow up for reevaluation.   Avoid alcohol use for now.

## 2017-11-22 NOTE — ED Triage Notes (Signed)
Pt sts abd pain x last two times she has drank ETOH

## 2018-04-13 ENCOUNTER — Encounter (HOSPITAL_COMMUNITY): Payer: Self-pay | Admitting: Emergency Medicine

## 2018-04-13 ENCOUNTER — Other Ambulatory Visit: Payer: Self-pay

## 2018-04-13 ENCOUNTER — Ambulatory Visit (HOSPITAL_COMMUNITY)
Admission: EM | Admit: 2018-04-13 | Discharge: 2018-04-13 | Disposition: A | Discharge status: Home / Self Care | Payer: Self-pay | Attending: Family Medicine | Admitting: Family Medicine

## 2018-04-13 DIAGNOSIS — N390 Urinary tract infection, site not specified: Secondary | ICD-10-CM | POA: Insufficient documentation

## 2018-04-13 DIAGNOSIS — N760 Acute vaginitis: Secondary | ICD-10-CM | POA: Insufficient documentation

## 2018-04-13 DIAGNOSIS — B9689 Other specified bacterial agents as the cause of diseases classified elsewhere: Secondary | ICD-10-CM | POA: Insufficient documentation

## 2018-04-13 LAB — POCT URINALYSIS DIP (DEVICE)
BILIRUBIN URINE: NEGATIVE
Glucose, UA: NEGATIVE mg/dL
KETONES UR: NEGATIVE mg/dL
NITRITE: NEGATIVE
Protein, ur: 30 mg/dL — AB
Specific Gravity, Urine: 1.025 (ref 1.005–1.030)
Urobilinogen, UA: 0.2 mg/dL (ref 0.0–1.0)
pH: 7 (ref 5.0–8.0)

## 2018-04-13 LAB — POCT PREGNANCY, URINE: Preg Test, Ur: NEGATIVE

## 2018-04-13 MED ORDER — NITROFURANTOIN MONOHYD MACRO 100 MG PO CAPS: 100.0000 mg | ORAL_CAPSULE | Freq: Two times a day (BID) | ORAL | 0 refills | Status: DC

## 2018-04-13 MED ORDER — METRONIDAZOLE 500 MG PO TABS: 500.0000 mg | ORAL_TABLET | Freq: Two times a day (BID) | ORAL | 0 refills | Status: DC

## 2018-04-13 NOTE — Discharge Instructions (Addendum)
Drink plenty of water Take the antibiotics as directed Metronidazole 2 x a day for a week.  Covers BV and trichomonas Macrobid 2 x a day.  Covers bladder infection. Make sure we have your correct phone number. We did lab testing during this visit.  If there are any abnormal findings that require change in medicine or indicate a positive result, you will be notified.  If all of your tests are normal, you will not be called.

## 2018-04-13 NOTE — ED Triage Notes (Signed)
Vaginal itching and painful urination.  Symptoms started Monday.  Patient has been drinking cranberry juice , this morning pain worsened significantly.  Left flank pain currently.

## 2018-04-13 NOTE — ED Provider Notes (Signed)
[facility]    CSN: [id number] Arrival date & time: 04/13/18  1408     History   Chief Complaint Chief Complaint  Patient presents with  . Urinary Tract Infection    HPI Laura Ashley is a 28 y.o. female.   HPI  Patient states that she has painful urination.  Some suprapubic pressure pressure.  She feels like she has to go the bathroom, then when she gets there she has very small amount.  She feels like she has a bladder infection.  Yesterday she developed vaginal itching and discharge.  She has a "strong odor".  She has had BV in the past.  She would like testing for STDs.  She does have unprotected sexual relations.  She is certain she is not pregnant.  No fever chills.  No rash.  Past Medical History:  Diagnosis Date  . Medical history non-contributory   . PCOS (polycystic ovarian syndrome)     Patient Active Problem List   Diagnosis Date Noted  . Morbidly obese (HCC) 02/28/2014  . Hypertension 02/28/2014  . Dysfunctional uterine bleeding 02/28/2014    Past Surgical History:  Procedure Laterality Date  . NO PAST SURGERIES      OB History    Gravida  2   Para      Term      Preterm      AB  2   Living  0     SAB  2   TAB      Ectopic      Multiple      Live Births               Home Medications    Prior to Admission medications   Medication Sig Start Date End Date Taking? Authorizing Provider  metroNIDAZOLE (FLAGYL) 500 MG tablet Take 1 tablet (500 mg total) by mouth 2 (two) times daily. 04/13/18   Eustace MooreNelson, Ozzie Remmers Sue, MD  nitrofurantoin, macrocrystal-monohydrate, (MACROBID) 100 MG capsule Take 1 capsule (100 mg total) by mouth 2 (two) times daily. 04/13/18   Eustace MooreNelson, Karter Haire Sue, MD    Family History Family History  Problem Relation Age of Onset  . Hypertension Father   . Diabetes Other     Social History Social History   Tobacco Use  . Smoking status: Never Smoker  . Smokeless tobacco: Never Used  Substance Use  Topics  . Alcohol use: Yes  . Drug use: No     Allergies   Penicillins   Review of Systems Review of Systems  Constitutional: Negative for chills and fever.  HENT: Negative for ear pain and sore throat.   Eyes: Negative for pain and visual disturbance.  Respiratory: Negative for cough and shortness of breath.   Cardiovascular: Negative for chest pain and palpitations.  Gastrointestinal: Negative for abdominal pain and vomiting.  Genitourinary: Positive for difficulty urinating, dysuria, frequency and vaginal discharge. Negative for hematuria.  Musculoskeletal: Negative for arthralgias and back pain.  Skin: Negative for color change and rash.  Neurological: Negative for seizures and syncope.  All other systems reviewed and are negative.    Physical Exam Triage Vital Signs ED Triage Vitals  Enc Vitals Group     BP 04/13/18 1513 (!) 147/91     Pulse Rate 04/13/18 1513 75     Resp 04/13/18 1513 18     Temp 04/13/18 1513 (!) 97.5 F (36.4 C)     Temp Source 04/13/18 1513 Temporal     SpO2  04/13/18 1513 97 %     Weight --      Height --      Head Circumference --      Peak Flow --      Pain Score 04/13/18 1509 4     Pain Loc --      Pain Edu? --      Excl. in GC? --    No data found.  Updated Vital Signs BP (!) 147/91 (BP Location: Right Arm) Comment (BP Location): forearm, regular  Pulse 75   Temp (!) 97.5 F (36.4 C) (Temporal)   Resp 18   LMP 01/11/2018 Comment: pcos  SpO2 97%        Physical Exam Constitutional:      General: She is not in acute distress.    Appearance: She is well-developed.  HENT:     Head: Normocephalic and atraumatic.  Eyes:     Conjunctiva/sclera: Conjunctivae normal.     Pupils: Pupils are equal, round, and reactive to light.  Neck:     Musculoskeletal: Normal range of motion.  Cardiovascular:     Rate and Rhythm: Normal rate and regular rhythm.     Heart sounds: Normal heart sounds.  Pulmonary:     Effort: Pulmonary  effort is normal. No respiratory distress.  Abdominal:     General: Abdomen is flat. There is no distension.     Palpations: Abdomen is soft.     Tenderness: There is no abdominal tenderness.  Musculoskeletal: Normal range of motion.  Skin:    General: Skin is warm and dry.  Neurological:     Mental Status: She is alert.      UC Treatments / Results  Labs (all labs ordered are listed, but only abnormal results are displayed) Labs Reviewed  POCT URINALYSIS DIP (DEVICE) - Abnormal; Notable for the following components:      Result Value   Hgb urine dipstick MODERATE (*)    Protein, ur 30 (*)    Leukocytes, UA TRACE (*)    All other components within normal limits  URINE CULTURE  POCT PREGNANCY, URINE  CERVICOVAGINAL ANCILLARY ONLY    EKG None  Radiology No results found.  Procedures Procedures (including critical care time)  Medications Ordered in UC Medications - No data to display  Initial Impression / Assessment and Plan / UC Course  I have reviewed the triage vital signs and the nursing notes.  Pertinent labs & imaging results that were available during my care of the patient were reviewed by me and considered in my medical decision making (see chart for details).     Patient has a history of BV.  She has a strong odor and some vaginal irritation.  She also has dysuria, and an abnormal urinalysis.  I will send this for culture.  We will do STD testing.  We will call her if any of her tests come back positive Final Clinical Impressions(s) / UC Diagnoses   Final diagnoses:  Lower urinary tract infectious disease  BV (bacterial vaginosis)     Discharge Instructions     Drink plenty of water Take the antibiotics as directed Metronidazole 2 x a day for a week.  Covers BV and trichomonas Macrobid 2 x a day.  Covers bladder infection. Make sure we have your correct phone number. We did lab testing during this visit.  If there are any abnormal findings that  require change in medicine or indicate a positive result, you will be  notified.  If all of your tests are normal, you will not be called.      ED Prescriptions    Medication Sig Dispense Auth. Provider   metroNIDAZOLE (FLAGYL) 500 MG tablet Take 1 tablet (500 mg total) by mouth 2 (two) times daily. 14 tablet Eustace Moore, MD   nitrofurantoin, macrocrystal-monohydrate, (MACROBID) 100 MG capsule Take 1 capsule (100 mg total) by mouth 2 (two) times daily. 14 capsule Eustace Moore, MD     Controlled Substance Prescriptions [place] Controlled Substance Registry consulted? Not Applicable   Eustace Moore, MD 04/13/18 2127

## 2018-04-14 LAB — URINE CULTURE: Culture: >=100000 — AB

## 2018-04-14 LAB — CERVICOVAGINAL ANCILLARY ONLY
Chlamydia: NEGATIVE
Neisseria Gonorrhea: NEGATIVE
Trichomonas: NEGATIVE

## 2018-09-08 ENCOUNTER — Encounter (HOSPITAL_COMMUNITY): Payer: Self-pay

## 2018-09-08 ENCOUNTER — Other Ambulatory Visit: Payer: Self-pay

## 2018-09-08 ENCOUNTER — Emergency Department (HOSPITAL_COMMUNITY): Payer: Self-pay

## 2018-09-08 ENCOUNTER — Emergency Department (HOSPITAL_COMMUNITY)
Admission: EM | Admit: 2018-09-08 | Discharge: 2018-09-08 | Disposition: A | Discharge status: Home / Self Care | Payer: Self-pay | Attending: Emergency Medicine | Admitting: Emergency Medicine

## 2018-09-08 DIAGNOSIS — K219 Gastro-esophageal reflux disease without esophagitis: Secondary | ICD-10-CM | POA: Insufficient documentation

## 2018-09-08 DIAGNOSIS — R739 Hyperglycemia, unspecified: Secondary | ICD-10-CM | POA: Insufficient documentation

## 2018-09-08 DIAGNOSIS — N760 Acute vaginitis: Secondary | ICD-10-CM | POA: Insufficient documentation

## 2018-09-08 DIAGNOSIS — B9689 Other specified bacterial agents as the cause of diseases classified elsewhere: Secondary | ICD-10-CM | POA: Insufficient documentation

## 2018-09-08 DIAGNOSIS — I10 Essential (primary) hypertension: Secondary | ICD-10-CM | POA: Insufficient documentation

## 2018-09-08 DIAGNOSIS — N898 Other specified noninflammatory disorders of vagina: Secondary | ICD-10-CM | POA: Insufficient documentation

## 2018-09-08 LAB — URINALYSIS, ROUTINE W REFLEX MICROSCOPIC
Bilirubin Urine: NEGATIVE
Glucose, UA: >=500 mg/dL — AB
Ketones, ur: 20 mg/dL — AB
Nitrite: NEGATIVE
Protein, ur: NEGATIVE mg/dL
Specific Gravity, Urine: 1.032 — ABNORMAL HIGH (ref 1.005–1.030)
pH: 6 (ref 5.0–8.0)

## 2018-09-08 LAB — BASIC METABOLIC PANEL
Anion gap: 12 (ref 5–15)
BUN: 10 mg/dL (ref 6–20)
CO2: 23 mmol/L (ref 22–32)
Calcium: 9.5 mg/dL (ref 8.9–10.3)
Chloride: 97 mmol/L — ABNORMAL LOW (ref 98–111)
Creatinine, Ser: 0.68 mg/dL (ref 0.44–1.00)
GFR calc Af Amer: >60 mL/min (ref >60)
GFR calc non Af Amer: >60 mL/min (ref >60)
Glucose, Bld: 362 mg/dL — ABNORMAL HIGH (ref 70–99)
Potassium: 4 mmol/L (ref 3.5–5.1)
Sodium: 132 mmol/L — ABNORMAL LOW (ref 135–145)

## 2018-09-08 LAB — WET PREP, GENITAL
Sperm: NONE SEEN
Trich, Wet Prep: NONE SEEN
Yeast Wet Prep HPF POC: NONE SEEN

## 2018-09-08 LAB — CBG MONITORING, ED
Glucose-Capillary: 420 mg/dL — ABNORMAL HIGH (ref 70–99)
Glucose-Capillary: 258 mg/dL — ABNORMAL HIGH (ref 70–99)

## 2018-09-08 LAB — CBC WITH DIFFERENTIAL/PLATELET
Abs Immature Granulocytes: 0.01 10*3/uL (ref 0.00–0.07)
Basophils Absolute: 0 10*3/uL (ref 0.0–0.1)
Basophils Relative: 1 %
Eosinophils Absolute: 0.1 10*3/uL (ref 0.0–0.5)
Eosinophils Relative: 3 %
HCT: 43.5 % (ref 36.0–46.0)
Hemoglobin: 15.4 g/dL — ABNORMAL HIGH (ref 12.0–15.0)
Immature Granulocytes: 0 %
Lymphocytes Relative: 43 %
Lymphs Abs: 2.4 10*3/uL (ref 0.7–4.0)
MCH: 30.8 pg (ref 26.0–34.0)
MCHC: 35.4 g/dL (ref 30.0–36.0)
MCV: 87 fL (ref 80.0–100.0)
Monocytes Absolute: 0.6 10*3/uL (ref 0.1–1.0)
Monocytes Relative: 10 %
Neutro Abs: 2.4 10*3/uL (ref 1.7–7.7)
Neutrophils Relative %: 43 %
Platelets: 321 10*3/uL (ref 150–400)
RBC: 5 MIL/uL (ref 3.87–5.11)
RDW: 11.8 % (ref 11.5–15.5)
WBC: 5.5 10*3/uL (ref 4.0–10.5)
nRBC: 0 % (ref 0.0–0.2)

## 2018-09-08 LAB — POC URINE PREG, ED: Preg Test, Ur: NEGATIVE

## 2018-09-08 MED ORDER — LIVING WELL WITH DIABETES BOOK
Freq: Once | Status: AC
  Administered 2018-09-08: 16:00:00
  Filled 2018-09-08: qty 1

## 2018-09-08 MED ORDER — METFORMIN HCL 1000 MG PO TABS: 500.0000 mg | ORAL_TABLET | Freq: Two times a day (BID) | ORAL | 0 refills | Status: DC

## 2018-09-08 MED ORDER — METRONIDAZOLE 500 MG PO TABS: 500.0000 mg | ORAL_TABLET | Freq: Two times a day (BID) | ORAL | 0 refills | Status: AC

## 2018-09-08 MED ORDER — FAMOTIDINE 20 MG PO TABS: 20.0000 mg | ORAL_TABLET | Freq: Two times a day (BID) | ORAL | 0 refills | Status: DC

## 2018-09-08 MED ORDER — SODIUM CHLORIDE 0.9 % IV BOLUS
1000.0000 mL | Freq: Once | INTRAVENOUS | Status: AC
  Administered 2018-09-08: 1000 mL via INTRAVENOUS

## 2018-09-08 MED ORDER — NYSTATIN 100000 UNIT/GM EX CREA: TOPICAL_CREAM | CUTANEOUS | 0 refills | Status: DC

## 2018-09-08 NOTE — ED Provider Notes (Signed)
[facility]-EMERGENCY DEPT Provider Note   CSN: [id number] Arrival date & time: 09/08/18  1046    History   Chief Complaint Chief Complaint  Patient presents with  . Chest Pain  . vaginal irritation    HPI Laura Ashley is a 28 y.o. female.     HPI   Patient is a 28 year old female with a history of PCOS who presents the emergency department today with multiple complaints including burning chest pain and vaginal irritation.  Chest pain: Pt c/o a burning sensation to the midsternum. Started 1 week ago and has been intermittent since. Has tried drinking water and sprite without relief. Pain seems to occur after eating. Rates pain 8/10.  States it feels like acid reflux.  She notes that today the pain started shortly after eating a meal griddle for breakfast.  States she laid down and woke up and the pain was worse.  Has never had sxs before. No associated SOB, cough, palpitations.  Denies any risk factors for PE.  Vaginal irritation: States she has had vaginal discharge/irritation and itching for the last 3 weeks.  She thought that she may have had a yeast infection so she used Monistat which seemed to help the discharge but after starting her menstrual cycle symptoms seem to be worse.   No dysuria, frequency, urgency.  Pt is sexually active with one female partner.  States that they use sexual toys however they do clean them between use.  She denies any abdominal pain, nausea, vomiting, urinary complaints, or fevers.  Past Medical History:  Diagnosis Date  . Medical history non-contributory   . PCOS (polycystic ovarian syndrome)     Patient Active Problem List   Diagnosis Date Noted  . Morbidly obese (HCC) 02/28/2014  . Hypertension 02/28/2014  . Dysfunctional uterine bleeding 02/28/2014    Past Surgical History:  Procedure Laterality Date  . NO PAST SURGERIES       OB History    Gravida  2   Para      Term      Preterm      AB  2   Living  0     SAB  2   TAB      Ectopic      Multiple      Live Births               Home Medications    Prior to Admission medications   Medication Sig Start Date End Date Taking? Authorizing Provider  famotidine (PEPCID) 20 MG tablet Take 1 tablet (20 mg total) by mouth 2 (two) times daily for 14 days. 09/08/18 09/22/18  Terion Hedman S, PA-C  metFORMIN (GLUCOPHAGE) 1000 MG tablet Take 0.5 tablets (500 mg total) by mouth 2 (two) times daily for 30 days. 09/08/18 10/08/18  Lan Entsminger S, PA-C  metroNIDAZOLE (FLAGYL) 500 MG tablet Take 1 tablet (500 mg total) by mouth 2 (two) times daily for 7 days. 09/08/18 09/15/18  Tiffanyann Deroo S, PA-C  nitrofurantoin, macrocrystal-monohydrate, (MACROBID) 100 MG capsule Take 1 capsule (100 mg total) by mouth 2 (two) times daily. Patient not taking: Reported on 09/08/2018 04/13/18   Eustace MooreNelson, Yvonne Sue, MD  nystatin cream (MYCOSTATIN) Apply to affected area 2 times daily for 7 days 09/08/18   Soriyah Osberg S, PA-C    Family History Family History  Problem Relation Age of Onset  . Hypertension Father   . Diabetes Other   . Healthy Mother  Social History Social History   Tobacco Use  . Smoking status: Never Smoker  . Smokeless tobacco: Never Used  Substance Use Topics  . Alcohol use: Yes  . Drug use: No     Allergies   Penicillins   Review of Systems Review of Systems  Constitutional: Negative for fever.  HENT: Negative for ear pain and sore throat.   Eyes: Negative for pain and visual disturbance.  Respiratory: Negative for cough and shortness of breath.   Cardiovascular: Positive for chest pain. Negative for leg swelling.  Gastrointestinal: Negative for abdominal pain, constipation, diarrhea, nausea and vomiting.  Genitourinary: Positive for vaginal discharge and vaginal pain (irritation). Negative for dysuria, frequency, hematuria, pelvic pain and urgency.  Musculoskeletal: Negative for back pain.  Skin: Negative  for rash.  Neurological: Negative for headaches.  All other systems reviewed and are negative.    Physical Exam Updated Vital Signs BP 131/86 (BP Location: Right Arm)   Pulse 76   Temp 98.5 F (36.9 C) (Oral)   Resp 18   Ht 5\' 4"  (1.626 m)   Wt 126.6 kg   LMP 08/17/2018 (Approximate)   SpO2 97%   BMI 47.89 kg/m   Physical Exam Vitals signs and nursing note reviewed.  Constitutional:      General: She is not in acute distress.    Appearance: She is well-developed. She is obese. She is not ill-appearing or toxic-appearing.  HENT:     Head: Normocephalic and atraumatic.  Eyes:     Conjunctiva/sclera: Conjunctivae normal.  Neck:     Musculoskeletal: Neck supple.  Cardiovascular:     Rate and Rhythm: Normal rate and regular rhythm.     Heart sounds: No murmur.  Pulmonary:     Effort: Pulmonary effort is normal. No respiratory distress.     Breath sounds: Normal breath sounds. No decreased breath sounds, wheezing, rhonchi or rales.  Abdominal:     Palpations: Abdomen is soft.     Tenderness: There is no abdominal tenderness.  Genitourinary:    Comments: Exam performed by Karrie Meresortni S Shaunika Italiano,  exam chaperoned Date: 09/08/2018 Pelvic exam: normal external genitalia without evidence of trauma. VULVA: Multiple areas of excoriation to the right side of the vulva. No vesicular lesions.  VAGINA: normal appearing vagina with normal color and discharge CERVIX: normal appearing cervix without lesions, cervical motion tenderness absent, cervical os closed with out purulent discharge; vaginal discharge is not present. Scant blood in the vaginal vault. Wet prep and DNA probe for chlamydia and GC obtained.   ADNEXA: normal adnexa in size, nontender and no masses UTERUS: uterus is normal size, shape, consistency and nontender.  Musculoskeletal:     Right lower leg: She exhibits no tenderness. No edema.     Left lower leg: She exhibits no tenderness. No edema.  Skin:    General: Skin is  warm and dry.  Neurological:     Mental Status: She is alert.      ED Treatments / Results  Labs (all labs ordered are listed, but only abnormal results are displayed) Labs Reviewed  WET PREP, GENITAL - Abnormal; Notable for the following components:      Result Value   Clue Cells Wet Prep HPF POC PRESENT (*)    WBC, Wet Prep HPF POC FEW (*)    All other components within normal limits  URINALYSIS, ROUTINE W REFLEX MICROSCOPIC - Abnormal; Notable for the following components:   Color, Urine AMBER (*)    Specific Gravity,  Urine 1.032 (*)    Glucose, UA >=500 (*)    Hgb urine dipstick LARGE (*)    Ketones, ur 20 (*)    Leukocytes,Ua SMALL (*)    Bacteria, UA RARE (*)    All other components within normal limits  CBC WITH DIFFERENTIAL/PLATELET - Abnormal; Notable for the following components:   Hemoglobin 15.4 (*)    All other components within normal limits  BASIC METABOLIC PANEL - Abnormal; Notable for the following components:   Sodium 132 (*)    Chloride 97 (*)    Glucose, Bld 362 (*)    All other components within normal limits  CBG MONITORING, ED - Abnormal; Notable for the following components:   Glucose-Capillary 420 (*)    All other components within normal limits  CBG MONITORING, ED - Abnormal; Notable for the following components:   Glucose-Capillary 258 (*)    All other components within normal limits  HSV CULTURE AND TYPING  HIV ANTIBODY (ROUTINE TESTING W REFLEX)  RPR  HEMOGLOBIN A1C  POC URINE PREG, ED  GC/CHLAMYDIA PROBE AMP ([place]) NOT AT [facility]    EKG EKG Interpretation  Date/Time:  Friday September 08 2018 10:56:14 EDT Ventricular Rate:  79 PR Interval:    QRS Duration: 91 QT Interval:  366 QTC Calculation: 420 R Axis:   77 Text Interpretation:  Sinus rhythm No significant change since last tracing Confirmed by Addison Lank [phone]) on 09/08/2018 2:32:20 PM   Radiology Dg Chest Portable 1 View  Result Date: 09/08/2018 CLINICAL DATA:  Chest  burning for 1 week. EXAM: PORTABLE CHEST 1 VIEW COMPARISON:  PA and lateral chest 04/10/2016. FINDINGS: The lungs are clear. Heart size is normal. No pneumothorax or pleural fluid. No acute or bony abnormality. IMPRESSION: Negative chest. Electronically Signed   By: Inge Rise M.D.   On: 09/08/2018 12:33    Procedures Procedures (including critical care time)  Medications Ordered in ED Medications  sodium chloride 0.9 % bolus 1,000 mL (0 mLs Intravenous Stopped 09/08/18 1524)  living well with diabetes book MISC ( Does not apply Given 09/08/18 1546)     Initial Impression / Assessment and Plan / ED Course  I have reviewed the triage vital signs and the nursing notes.  Pertinent labs & imaging results that were available during my care of the patient were reviewed by me and considered in my medical decision making (see chart for details).     Final Clinical Impressions(s) / ED Diagnoses   Final diagnoses:  Gastroesophageal reflux disease without esophagitis  Bacterial vaginosis  Vaginal irritation  Hyperglycemia   Patient is a 28 year old female with a history of PCOS who presents the emergency department today with multiple complaints including burning chest pain and vaginal irritation.  Chest pain: Described as burning, midsternal.  Occurs only after eating food.  Today had episode after eating fast food and lying down afterwards.  Seems consistent with GERD.  Chest x-ray negative.  EKG nonischemic.  No arrhythmia.  Unlikely related to ACS, PE or other acute cardiac/pulmonary etiology.  Rx for Pepcid given. Dietary instructions discussed.   Vaginal irritation: On exam multiple areas of excoriation to the right labia.  HSV swab obtained. More likely 2/2 itching from recent yeast infection. Reports recently self treated with Monistat, which helped symptoms.  Wet prep without evidence of yeast but with WBCs in clue cells. Will give rx for flagyl to tx BV. Concern for partially tx  yeast infection esp in setting of glucosuria,  rx for nystatin cream given.  GC/chlamydia obtained, patient reports being low risk will hold prophylactic treatment.  HIV/RPR obtained and pending at time of d/c.   Hyperglycemia: Found to have glucosuria on UA today.  CBG 420 initially.  Given IV fluid bolus, repeat CBG 258.  Also given dose of metformin in the ED.  CBC without leukocytosis or anemia.  BMP with normal bicarb, elevated blood glucose, no elevated anion gap.  Clinical picture does not support a diagnosis of DKA.  Patient was given information about diabetes.  Case management was consulted as patient does not have PCP.  Appointment was made for follow-up.  Patient will be started on metformin.  Advised on healthy eating and exercise.  Advised on specific return precautions and relation to hyperglycemia.  Advised to return to the ER for new or worsening symptoms.  She voices understanding of the plan and reasons to return.  All questions answered.  Patient stable for discharge.  ED Discharge Orders         Ordered    metroNIDAZOLE (FLAGYL) 500 MG tablet  2 times daily     09/08/18 1611    famotidine (PEPCID) 20 MG tablet  2 times daily     09/08/18 1611    nystatin cream (MYCOSTATIN)     09/08/18 1611    metFORMIN (GLUCOPHAGE) 1000 MG tablet  2 times daily     09/08/18 [address]           Icelynn Onken, Wickliffeortni S, PA-C 09/08/18 1905    Nira Connardama, Pedro Eduardo, MD 09/10/18 [phone]

## 2018-09-08 NOTE — ED Triage Notes (Signed)
Patient c/o chest "burning x 1 week." Patient denies any diaphoresis, N/V, or SOB.  Patient also c/o vaginal irritation and states she had "cottage cheese" drainage prior to having her period and since her period started, "the itching is worse."

## 2018-09-08 NOTE — ED Notes (Signed)
Patient reports she "is a lesbian" and is not concerned about gonorrhea or chlamydia. Patient also denies any concern for HIV or Syphilis. Patient reports she has not recently had intercourse  Pelvic materials setup at this time and patient undressed from waist down.

## 2018-09-08 NOTE — Discharge Instructions (Addendum)
You were given Pepcid to help with your acid reflux.    You were also given antibiotics to help treat bacterial vaginosis.  Do not drink alcohol while taking this medication you are given nystatin cream to help with your vaginal irritation.  Please use as directed.    You were also started on metformin to help with your elevated blood sugars.  Be aware that this medication may cause diarrhea.  You have been tested for HIV, syphilis, chlamydia and gonorrhea.  These results will be available in approximately 3 days and you will be contacted by the hospital if the results are positive. Avoid sexual contact until you are aware of the results, and please inform all sexual partners if you test positive for any of these diseases.  You are scheduled for an appointment with [facility], please keep this appointment.  Please return to the emergency department for any new or worsening symptoms.

## 2018-09-08 NOTE — TOC Initial Note (Addendum)
Transition of Care [facility]) - Initial/Assessment Note    Patient Details  Name: Keyaira Clapham MRN: [id number] Date of Birth: 1990/09/03  [facility] CM/SW Contact:    Erenest Rasher, RN Phone Number: 09/08/2018, 2:56 PM  Clinical Narrative:                  Spoke to pt and states she works. States she can afford meds if they are on [facility] $4 or inexpensive. Waiting dc med list. Appt arranged for [facility] on 09/25/2018 at 2:30 pm. Provided information to pt and placed on dc instructions.   Sent pt goodrx coupons to pt via text. Total for meds will be $17 at [employer] or [place]. States she can afford that price. Explained to pt that [facility] does offer a discount rate for meds and she can utilize that pharmacy.     Expected Discharge Plan: Home/Self Care Barriers to Discharge: Continued Medical Work up   Patient Goals and CMS Choice        Expected Discharge Plan and Services Expected Discharge Plan: Home/Self Care   Discharge Planning Services: CM Consult, Medication Assistance, Follow-up appt scheduled   Living arrangements for the past 2 months: Apartment                                      Prior Living Arrangements/Services Living arrangements for the past 2 months: Apartment Lives with:: Self Patient language and need for interpreter reviewed:: Yes Do you feel safe going back to the place where you live?: Yes      Need for Family Participation in Patient Care: No (Comment) Care giver support system in place?: No (comment)   Criminal Activity/Legal Involvement Pertinent to Current Situation/Hospitalization: No - Comment as needed  Activities of Daily Living      Permission Sought/Granted Permission sought to share information with : Case Manager, Guardian Permission granted to share information with : Yes, Verbal Permission Granted              Emotional Assessment   Attitude/Demeanor/Rapport: Engaged Affect (typically observed):  Accepting Orientation: : Oriented to Self, Oriented to Place, Oriented to  Time, Oriented to Situation   Psych Involvement: No (comment)  Admission diagnosis:  chest pain vag irritation Patient Active Problem List   Diagnosis Date Noted  . Morbidly obese ([place]) 02/28/2014  . Hypertension 02/28/2014  . Dysfunctional uterine bleeding 02/28/2014   PCP:  Patient, No Pcp Per Pharmacy:   [facility] [place], [address] [place] [place] [address] Phone: [phone] Fax: [phone]  [place], [address] [place] [address] Phone: [phone] Fax: [id number] - [place], [place] - [address] AT [address] [place] [address] Phone: [phone] Fax: [phone]  [place] ([place]), [place] - [address] [place] [address] Phone: [phone] Fax: [place], [facility] [place] [address] Phone: [phone] Fax: [phone]     Social Determinants of Health ([place]) Interventions    Readmission Risk Interventions No flowsheet data found.

## 2018-09-09 LAB — HIV ANTIBODY (ROUTINE TESTING W REFLEX): HIV Screen 4th Generation wRfx: NONREACTIVE

## 2018-09-09 LAB — HEMOGLOBIN A1C
Hgb A1c MFr Bld: 10.6 % — ABNORMAL HIGH (ref 4.8–5.6)
Mean Plasma Glucose: 258 mg/dL

## 2018-09-09 LAB — RPR: RPR Ser Ql: NONREACTIVE

## 2018-09-11 LAB — HSV CULTURE AND TYPING

## 2018-09-12 LAB — GC/CHLAMYDIA PROBE AMP (~~LOC~~) NOT AT ARMC
Chlamydia: NEGATIVE
Neisseria Gonorrhea: NEGATIVE

## 2018-09-19 ENCOUNTER — Telehealth: Payer: Self-pay | Admitting: *Deleted

## 2018-09-19 NOTE — Telephone Encounter (Signed)
Pt called for advise regarding continued vaginal discomfort.  EDCM suggested pt speak with pharmacist for OTC treatment or visit Urgent Care as her office appointment isn't until 7/13.  Pt verbalized understanding of options given.

## 2018-09-25 ENCOUNTER — Other Ambulatory Visit: Payer: Self-pay

## 2018-09-25 ENCOUNTER — Encounter: Payer: Self-pay | Admitting: Pharmacist

## 2018-09-25 ENCOUNTER — Encounter: Payer: Self-pay | Admitting: Internal Medicine

## 2018-09-25 ENCOUNTER — Ambulatory Visit: Payer: Self-pay | Attending: Internal Medicine | Admitting: Internal Medicine

## 2018-09-25 ENCOUNTER — Ambulatory Visit (HOSPITAL_BASED_OUTPATIENT_CLINIC_OR_DEPARTMENT_OTHER): Payer: Self-pay | Admitting: Pharmacist

## 2018-09-25 VITALS — BP 136/92 | HR 86 | Temp 98.1°F | Resp 16 | Ht 64.0 in | Wt 286.0 lb

## 2018-09-25 DIAGNOSIS — E1165 Type 2 diabetes mellitus with hyperglycemia: Secondary | ICD-10-CM

## 2018-09-25 DIAGNOSIS — Z8742 Personal history of other diseases of the female genital tract: Secondary | ICD-10-CM | POA: Insufficient documentation

## 2018-09-25 DIAGNOSIS — E119 Type 2 diabetes mellitus without complications: Secondary | ICD-10-CM | POA: Insufficient documentation

## 2018-09-25 DIAGNOSIS — Z6841 Body Mass Index (BMI) 40.0 and over, adult: Secondary | ICD-10-CM

## 2018-09-25 DIAGNOSIS — R030 Elevated blood-pressure reading, without diagnosis of hypertension: Secondary | ICD-10-CM | POA: Insufficient documentation

## 2018-09-25 LAB — POCT URINALYSIS DIP (CLINITEK)
Bilirubin, UA: NEGATIVE
Glucose, UA: 500 mg/dL — AB
Leukocytes, UA: NEGATIVE
Nitrite, UA: NEGATIVE
POC PROTEIN,UA: 100 — AB
Spec Grav, UA: 1.015 (ref 1.010–1.025)
Urobilinogen, UA: 0.2 E.U./dL
pH, UA: 5 (ref 5.0–8.0)

## 2018-09-25 LAB — GLUCOSE, POCT (MANUAL RESULT ENTRY)
POC Glucose: 370 mg/dl — AB (ref 70–99)
POC Glucose: 372 mg/dl — AB (ref 70–99)

## 2018-09-25 MED ORDER — TRUEPLUS LANCETS 28G MISC: 4 refills | Status: DC

## 2018-09-25 MED ORDER — TRUE METRIX METER W/DEVICE KIT: PACK | 0 refills | Status: DC

## 2018-09-25 MED ORDER — INSULIN ASPART 100 UNIT/ML ~~LOC~~ SOLN
6.0000 [IU] | Freq: Once | SUBCUTANEOUS | Status: AC
  Administered 2018-09-25: 12:00:00 6 [IU] via SUBCUTANEOUS

## 2018-09-25 MED ORDER — LANTUS SOLOSTAR 100 UNIT/ML ~~LOC~~ SOPN: 15.0000 [IU] | PEN_INJECTOR | Freq: Every day | SUBCUTANEOUS | 99 refills | Status: DC

## 2018-09-25 MED ORDER — TRUE METRIX BLOOD GLUCOSE TEST VI STRP: ORAL_STRIP | 12 refills | Status: DC

## 2018-09-25 MED ORDER — BD PEN NEEDLE MINI U/F 31G X 5 MM MISC: 3 refills | Status: DC

## 2018-09-25 MED ORDER — METFORMIN HCL 1000 MG PO TABS: 500.0000 mg | ORAL_TABLET | Freq: Two times a day (BID) | ORAL | 6 refills | Status: DC

## 2018-09-25 MED FILL — TRUE METRIX TEST STRIP: 25 days supply | Qty: 100 | Fill #0

## 2018-09-25 MED FILL — metFORMIN HCL 1000 MG TABS: 1000 | 30 days supply | Qty: 30 | Fill #0

## 2018-09-25 MED FILL — !LANTUS SOLOSTAR 100UNITS/M: 100 | 20 days supply | Qty: 3 | Fill #0

## 2018-09-25 MED FILL — TRUEPLUS PEN NDL 31GX5/16: 31G X 8 MM | 25 days supply | Qty: 100 | Fill #0

## 2018-09-25 MED FILL — TRUEplus LANCETS 28G MISC: 25 days supply | Qty: 100 | Fill #0

## 2018-09-25 MED FILL — !TRUE METRIX BLOOD GLUCOSE: 365 days supply | Qty: 1 | Fill #0

## 2018-09-25 NOTE — Progress Notes (Signed)
Patient ID: Laura Ashley, female    DOB: [birth year]  MRN: [id number]  CC: Hospitalization Follow-up (ED )   Subjective: Laura Ashley is a 28 y.o. female who presents for new patient visit and hospital follow-up. Her concerns today include:  Patient with history of obesity and PCOS  Patient seen in the emergency room 09/08/2018 with complaints of chest pain and vaginal irritation.  Chest pain history was suggestive of GERD.  She was started on famotidine.  She was found to have bacterial vaginosis on wet prep.  Incidental finding on UA was glucose.  Chemistry revealed blood sugar of 362 with an anion gap of 12.  Hemoglobin A1c was 10.6.  Patient was started on metformin 500 mg twice a day.  Since ER visit patient reports compliance with metformin.  It causes a little bit of nausea.  She denies any diarrhea.  She endorses some blurred vision, polyuria and polydipsia.  No numbness or tingling in the hands or feet.  Family history of diabetes in her father. -Patient admits that she can do better with her eating habits and needs to be more active.  Blood pressure noted to be elevated.  On review of her chart I see that blood pressure was elevated on other visits within the healthcare system.  Patient states that her blood pressure fluctuates.  She is not on any medication.  Past medical history, social history, family history, surgical history reviewed and updated.  Patient Active Problem List   Diagnosis Date Noted  . Morbidly obese ([place]) 02/28/2014  . Hypertension 02/28/2014  . Dysfunctional uterine bleeding 02/28/2014     Current Outpatient Medications on File Prior to Visit  Medication Sig Dispense Refill  . famotidine (PEPCID) 20 MG tablet Take 1 tablet (20 mg total) by mouth 2 (two) times daily for 14 days. 28 tablet 0   No current facility-administered medications on file prior to visit.     Allergies  Allergen Reactions  . Penicillins Rash    Has patient had a PCN  reaction causing immediate rash, facial/tongue/throat swelling, SOB or lightheadedness with hypotension: Yes Has patient had a PCN reaction causing severe rash involving mucus membranes or skin necrosis: Yes Has patient had a PCN reaction that required hospitalization: No Has patient had a PCN reaction occurring within the last 10 years: No If all of the above answers are "NO", then may proceed with Cephalosporin use.    Social History   Socioeconomic History  . Marital status: Single    Spouse name: Not on file  . Number of children: 0  . Years of education: Not on file  . Highest education level: Associate degree: academic program  Occupational History  . Not on file  Social Needs  . Financial resource strain: Not on file  . Food insecurity    Worry: Not on file    Inability: Not on file  . Transportation needs    Medical: Not on file    Non-medical: Not on file  Tobacco Use  . Smoking status: Former Research scientist (life sciences)  . Smokeless tobacco: Never Used  Substance and Sexual Activity  . Alcohol use: Yes  . Drug use: No  . Sexual activity: Yes  Lifestyle  . Physical activity    Days per week: Not on file    Minutes per session: Not on file  . Stress: Not on file  Relationships  . Social Herbalist on phone: Not on file    Gets together:  Not on file    Attends religious service: Not on file    Active member of club or organization: Not on file    Attends meetings of clubs or organizations: Not on file    Relationship status: Not on file  . Intimate partner violence    Fear of current or ex partner: Not on file    Emotionally abused: Not on file    Physically abused: Not on file    Forced sexual activity: Not on file  Other Topics Concern  . Not on file  Social History Narrative  . Not on file    Family History  Problem Relation Age of Onset  . Diabetes Father   . Diabetes Other   . Healthy Mother     Past Surgical History:  Procedure Laterality Date  . NO  PAST SURGERIES      ROS: Review of Systems Negative except as stated above  PHYSICAL EXAM: BP (!) 136/92   Pulse 86   Temp 98.1 F (36.7 C) (Oral)   Resp 16   Ht '5\' 4"'$  (1.626 m)   Wt 286 lb (129.7 kg)   SpO2 96%   BMI 49.09 kg/m   Wt Readings from Last 3 Encounters:  09/25/18 286 lb (129.7 kg)  09/08/18 279 lb (126.6 kg)  04/21/17 235 lb (106.6 kg)   Physical Exam  General appearance - alert, well appearing, morbidly obese African-American female and in no distress Mental status - normal mood, behavior, speech, dress, motor activity, and thought processes Eyes - pupils equal and reactive, extraocular eye movements intact Nose - normal and patent, no erythema, discharge or polyps Mouth - mucous membranes moist, pharynx normal without lesions Neck - supple, no significant adenopathy Chest - clear to auscultation, no wheezes, rales or rhonchi, symmetric air entry Heart - normal rate, regular rhythm, normal S1, S2, no murmurs, rubs, clicks or gallops Extremities - peripheral pulses normal, no pedal edema, no clubbing or cyanosis Diabetic Foot Exam - Simple   Simple Foot Form Visual Inspection No deformities, no ulcerations, no other skin breakdown bilaterally: Yes Sensation Testing Intact to touch and monofilament testing bilaterally: Yes Pulse Check Posterior Tibialis and Dorsalis pulse intact bilaterally: Yes Comments      CMP Latest Ref Rng & Units 09/08/2018 02/28/2014 11/16/2012  Glucose 70 - 99 mg/dL 362(H) 94 100(H)  BUN 6 - 20 mg/dL '10 8 10  '$ Creatinine 0.44 - 1.00 mg/dL 0.68 0.86 0.70  Sodium 135 - 145 mmol/L 132(L) 138 141  Potassium 3.5 - 5.1 mmol/L 4.0 4.0 3.9  Chloride 98 - 111 mmol/L 97(L) 103 105  CO2 22 - 32 mmol/L '23 26 25  '$ Calcium 8.9 - 10.3 mg/dL 9.5 9.7 9.6  Total Protein 6.0 - 8.3 g/dL - - 7.5  Total Bilirubin 0.3 - 1.2 mg/dL - - 0.3  Alkaline Phos 39 - 117 U/L - - 64  AST 0 - 37 U/L - - 21  ALT 0 - 35 U/L - - 20   Lipid Panel  No results  found for: CHOL, TRIG, HDL, CHOLHDL, VLDL, LDLCALC, LDLDIRECT  CBC    Component Value Date/Time   WBC 5.5 09/08/2018 1328   RBC 5.00 09/08/2018 1328   HGB 15.4 (H) 09/08/2018 1328   HCT 43.5 09/08/2018 1328   PLT 321 09/08/2018 1328   MCV 87.0 09/08/2018 1328   MCH 30.8 09/08/2018 1328   MCHC 35.4 09/08/2018 1328   RDW 11.8 09/08/2018 1328   LYMPHSABS 2.4 09/08/2018  1328   MONOABS 0.6 09/08/2018 1328   EOSABS 0.1 09/08/2018 1328   BASOSABS 0.0 09/08/2018 1328    ASSESSMENT AND PLAN: 1. New onset type 2 diabetes mellitus ([place]) Went over with her what his diabetes, how is it treated, complications that can develop from diabetes, discussed the importance of yearly eye exams.  Encouraged her to schedule one at this time.  Discussed good diabetic foot care. -Dietary counseling given.  She was also given a pamphlet and other printed information. Encouraged her to get in some form of moderate intensity exercise 3 to 4 days a week for 30 minutes. -Continue metformin.  I recommend starting Lantus insulin to take post dinner or at bedtime.  Patient was initially apprehensive about being on injections but was agreeable to it.    Clinical pharmacist to do insulin teaching.  Went over signs and symptoms of hypoglycemia and how to treat.  After we get the blood sugars down adequately we can change her from Lantus to Trulicity - POCT glucose (manual entry) - Microalbumin / creatinine urine ratio - POCT URINALYSIS DIP (CLINITEK) - insulin aspart (novoLOG) injection 6 Units - POCT glucose (manual entry) - Insulin Glargine (LANTUS SOLOSTAR) 100 UNIT/ML Solostar Pen; Inject 15 Units into the skin daily.  Dispense: 5 pen; Refill: PRN - Insulin Pen Needle (B-D UF III MINI PEN NEEDLES) 31G X 5 MM MISC; Use as directed  Dispense: 100 each; Refill: 3 - metFORMIN (GLUCOPHAGE) 1000 MG tablet; Take 0.5 tablets (500 mg total) by mouth 2 (two) times daily.  Dispense: 30 tablet; Refill: 6 - Blood Glucose  Monitoring Suppl (TRUE METRIX METER) w/Device KIT; Use as directed  Dispense: 1 kit; Refill: 0 - glucose blood (TRUE METRIX BLOOD GLUCOSE TEST) test strip; Use as instructed  Dispense: 100 each; Refill: 12 - TRUEplus Lancets 28G MISC; Use as directed  Dispense: 100 each; Refill: 4  2. Elevated blood pressure reading DASH diet discussed and encouraged.  We will recheck of blood pressure on follow-up visit.  If still elevated we will start an antihypertensive medication  3. Class 3 severe obesity due to excess calories with serious comorbidity and body mass index (BMI) of 45.0 to 49.9 in adult [facility]) See #1 above  4. History of PCOS Metformin will help with this  Patient was given the opportunity to ask questions.  Patient verbalized understanding of the plan and was able to repeat key elements of the plan.   Orders Placed This Encounter  Procedures  . Microalbumin / creatinine urine ratio  . POCT glucose (manual entry)  . POCT URINALYSIS DIP (CLINITEK)  . POCT glucose (manual entry)     Requested Prescriptions   Signed Prescriptions Disp Refills  . Insulin Glargine (LANTUS SOLOSTAR) 100 UNIT/ML Solostar Pen 5 pen PRN    Sig: Inject 15 Units into the skin daily.  . Insulin Pen Needle (B-D UF III MINI PEN NEEDLES) 31G X 5 MM MISC 100 each 3    Sig: Use as directed  . metFORMIN (GLUCOPHAGE) 1000 MG tablet 30 tablet 6    Sig: Take 0.5 tablets (500 mg total) by mouth 2 (two) times daily.  . Blood Glucose Monitoring Suppl (TRUE METRIX METER) w/Device KIT 1 kit 0    Sig: Use as directed  . glucose blood (TRUE METRIX BLOOD GLUCOSE TEST) test strip 100 each 12    Sig: Use as instructed  . TRUEplus Lancets 28G MISC 100 each 4    Sig: Use as directed  Return in about 3 weeks (around 10/16/2018).  Karle Plumber, MD, FACP

## 2018-09-25 NOTE — Progress Notes (Signed)
Patient was educated on the use of the Lantus pen. Reviewed necessary supplies and operation of the pen. Also reviewed goal blood glucose levels. Patient was able to demonstrate use. All questions and concerns were addressed.  

## 2018-09-25 NOTE — Progress Notes (Signed)
cbg- [id number]

## 2018-09-25 NOTE — Patient Instructions (Signed)
Try to check your blood sugars once or twice a day before meals.  The goal is to be 90-130.  If you check 2 hours after a meal, the goal for blood sugar reading is less than 180.  Please record your blood sugar readings and bring them in with you on your next appointment.  We have started you on Lantus insulin 15 units daily.  Continue the Metformin at the current dose that you are taking..  Try to get an eye exam done as soon as you are able to financially.  Try to cut back on salt in the food as salt does adversely affect the blood pressure.  Try to get in some exercise at least 3 to 4 days a week for 30 minutes.   Diabetes Mellitus and Standards of Medical Care Managing diabetes (diabetes mellitus) can be complicated. Your diabetes treatment may be managed by a team of health care providers, including:  A physician who specializes in diabetes (endocrinologist).  A nurse practitioner or physician assistant.  Nurses.  A diet and nutrition specialist (registered dietitian).  A certified diabetes educator (CDE).  An exercise specialist.  A pharmacist.  An eye doctor.  A foot specialist (podiatrist).  A dentist.  A primary care provider.  A mental health provider. Your health care providers follow guidelines to help you get the best quality of care. The following schedule is a general guideline for your diabetes management plan. Your health care providers may give you more specific instructions. Physical exams Upon being diagnosed with diabetes mellitus, and each year after that, your health care provider will ask about your medical and family history. He or she will also do a physical exam. Your exam may include:  Measuring your height, weight, and body mass index (BMI).  Checking your blood pressure. This will be done at every routine medical visit. Your target blood pressure may vary depending on your medical conditions, your age, and other factors.  Thyroid gland exam.   Skin exam.  Screening for damage to your nerves (peripheral neuropathy). This may include checking the pulse in your legs and feet and checking the level of sensation in your hands and feet.  A complete foot exam to inspect the structure and skin of your feet, including checking for cuts, bruises, redness, blisters, sores, or other problems.  Screening for blood vessel (vascular) problems, which may include checking the pulse in your legs and feet and checking your temperature. Blood tests Depending on your treatment plan and your personal needs, you may have the following tests done:  HbA1c (hemoglobin A1c). This test provides information about blood sugar (glucose) control over the previous 2-3 months. It is used to adjust your treatment plan, if needed. This test will be done: ? At least 2 times a year, if you are meeting your treatment goals. ? 4 times a year, if you are not meeting your treatment goals or if treatment goals have changed.  Lipid testing, including total, LDL, and HDL cholesterol and triglyceride levels. ? The goal for LDL is less than 100 mg/dL (5.5 mmol/L). If you are at high risk for complications, the goal is less than 70 mg/dL (3.9 mmol/L). ? The goal for HDL is 40 mg/dL (2.2 mmol/L) or higher for men and 50 mg/dL (2.8 mmol/L) or higher for women. An HDL cholesterol of 60 mg/dL (3.3 mmol/L) or higher gives some protection against heart disease. ? The goal for triglycerides is less than 150 mg/dL (8.3 mmol/L).  Liver  function tests.  Kidney function tests.  Thyroid function tests. Dental and eye exams  Visit your dentist two times a year.  If you have type 1 diabetes, your health care provider may recommend an eye exam 3-5 years after you are diagnosed, and then once a year after your first exam. ? For children with type 1 diabetes, a health care provider may recommend an eye exam when your child is age 95 or older and has had diabetes for 3-5 years. After the  first exam, your child should get an eye exam once a year.  If you have type 2 diabetes, your health care provider may recommend an eye exam as soon as you are diagnosed, and then once a year after your first exam. Immunizations   The yearly flu (influenza) vaccine is recommended for everyone 6 months or older who has diabetes.  The pneumonia (pneumococcal) vaccine is recommended for everyone 2 years or older who has diabetes. If you are 71 or older, you may get the pneumonia vaccine as a series of two separate shots.  The hepatitis B vaccine is recommended for adults shortly after being diagnosed with diabetes.  Adults and children with diabetes should receive all other vaccines according to age-specific recommendations from the Centers for Disease Control and Prevention (CDC). Mental and emotional health Screening for symptoms of eating disorders, anxiety, and depression is recommended at the time of diagnosis and afterward as needed. If your screening shows that you have symptoms (positive screening result), you may need more evaluation and you may work with a mental health care provider. Treatment plan Your treatment plan will be reviewed at every medical visit. You and your health care provider will discuss:  How you are taking your medicines, including insulin.  Any side effects you are experiencing.  Your blood glucose target goals.  The frequency of your blood glucose monitoring.  Lifestyle habits, such as activity level as well as tobacco, alcohol, and substance use. Diabetes self-management education Your health care provider will assess how well you are monitoring your blood glucose levels and whether you are taking your insulin correctly. He or she may refer you to:  A certified diabetes educator to manage your diabetes throughout your life, starting at diagnosis.  A registered dietitian who can create or review your personal nutrition plan.  An exercise specialist who  can discuss your activity level and exercise plan. Summary  Managing diabetes (diabetes mellitus) can be complicated. Your diabetes treatment may be managed by a team of health care providers.  Your health care providers follow guidelines in order to help you get the best quality of care.  Standards of care including having regular physical exams, blood tests, blood pressure monitoring, immunizations, screening tests, and education about how to manage your diabetes.  Your health care providers may also give you more specific instructions based on your individual health. This information is not intended to replace advice given to you by your health care provider. Make sure you discuss any questions you have with your health care provider. Document Released: 12/27/2008 Document Revised: 11/18/2017 Document Reviewed: 11/28/2015 Elsevier Patient Education  2020 [employer].   Hypoglycemia Hypoglycemia is when the sugar (glucose) level in your blood is too low. Signs of low blood sugar may include:  Feeling: ? Hungry. ? Worried or nervous (anxious). ? Sweaty and clammy. ? Confused. ? Dizzy. ? Sleepy. ? Sick to your stomach (nauseous).  Having: ? A fast heartbeat. ? A headache. ? A change  in your vision. ? Tingling or no feeling (numbness) around your mouth, lips, or tongue. ? Jerky movements that you cannot control (seizure).  Having trouble with: ? Moving (coordination). ? Sleeping. ? Passing out (fainting). ? Getting upset easily (irritability). Low blood sugar can happen to people who have diabetes and people who do not have diabetes. Low blood sugar can happen quickly, and it can be an emergency. Treating low blood sugar Low blood sugar is often treated by eating or drinking something sugary right away, such as:  Fruit juice, 4-6 oz (120-150 mL).  Regular soda (not diet soda), 4-6 oz (120-150 mL).  Low-fat milk, 4 oz (120 mL).  Several pieces of hard candy.  Sugar  or honey, 1 Tbsp (15 mL). Treating low blood sugar if you have diabetes If you can think clearly and swallow safely, follow the 15:15 rule:  Take 15 grams of a fast-acting carb (carbohydrate). Talk with your doctor about how much you should take.  Always keep a source of fast-acting carb with you, such as: ? Sugar tablets (glucose pills). Take 3-4 pills. ? 6-8 pieces of hard candy. ? 4-6 oz (120-150 mL) of fruit juice. ? 4-6 oz (120-150 mL) of regular (not diet) soda. ? 1 Tbsp (15 mL) honey or sugar.  Check your blood sugar 15 minutes after you take the carb.  If your blood sugar is still at or below 70 mg/dL (3.9 mmol/L), take 15 grams of a carb again.  If your blood sugar does not go above 70 mg/dL (3.9 mmol/L) after 3 tries, get help right away.  After your blood sugar goes back to normal, eat a meal or a snack within 1 hour.  Treating very low blood sugar If your blood sugar is at or below 54 mg/dL (3 mmol/L), you have very low blood sugar (severe hypoglycemia). This may also cause:  Passing out.  Jerky movements you cannot control (seizure).  Losing consciousness (coma). This is an emergency. Do not wait to see if the symptoms will go away. Get medical help right away. Call your local emergency services (911 in the [place] Do not drive yourself to the hospital. If you have very low blood sugar and you cannot eat or drink, you may need a glucagon shot (injection). A family member or friend should learn how to check your blood sugar and how to give you a glucagon shot. Ask your doctor if you need to have a glucagon shot kit at home. Follow these instructions at home: General instructions  Take over-the-counter and prescription medicines only as told by your doctor.  Stay aware of your blood sugar as told by your doctor.  Limit alcohol intake to no more than 1 drink a day for nonpregnant women and 2 drinks a day for men. One drink equals 12 oz of beer (355 mL), 5 oz of wine  (148 mL), or 1 oz of hard liquor (44 mL).  Keep all follow-up visits as told by your doctor. This is important. If you have diabetes:   Follow your diabetes care plan as told by your doctor. Make sure you: ? Know the signs of low blood sugar. ? Take your medicines as told. ? Follow your exercise and meal plan. ? Eat on time. Do not skip meals. ? Check your blood sugar as often as told by your doctor. Always check it before and after exercise. ? Follow your sick day plan when you cannot eat or drink normally. Make this plan ahead  of time with your doctor.  Share your diabetes care plan with: ? Your work or school. ? People you live with.  Check your pee (urine) for ketones: ? When you are sick. ? As told by your doctor.  Carry a card or wear jewelry that says you have diabetes. Contact a doctor if:  You have trouble keeping your blood sugar in your target range.  You have low blood sugar often. Get help right away if:  You still have symptoms after you eat or drink something sugary.  Your blood sugar is at or below 54 mg/dL (3 mmol/L).  You have jerky movements that you cannot control.  You pass out. These symptoms may be an emergency. Do not wait to see if the symptoms will go away. Get medical help right away. Call your local emergency services (911 in the [place] Do not drive yourself to the hospital. Summary  Hypoglycemia happens when the level of sugar (glucose) in your blood is too low.  Low blood sugar can happen to people who have diabetes and people who do not have diabetes. Low blood sugar can happen quickly, and it can be an emergency.  Make sure you know the signs of low blood sugar and know how to treat it.  Always keep a source of sugar (fast-acting carb) with you to treat low blood sugar. This information is not intended to replace advice given to you by your health care provider. Make sure you discuss any questions you have with your health care provider.  Document Released: 05/26/2009 Document Revised: 06/22/2018 Document Reviewed: 04/04/2015 Elsevier Patient Education  2020 [employer].

## 2018-09-26 LAB — MICROALBUMIN / CREATININE URINE RATIO
Creatinine, Urine: 89.6 mg/dL
Microalb/Creat Ratio: 89 mg/g creat — ABNORMAL HIGH (ref 0–29)
Microalbumin, Urine: 79.8 ug/mL

## 2018-09-27 ENCOUNTER — Telehealth: Payer: Self-pay

## 2018-09-27 NOTE — Telephone Encounter (Signed)
Contacted pt to go over urine results pt is aware   Pt had questions regarding insulin. Spoke with Lurena Joiner and he will follow up with pt

## 2018-09-27 NOTE — Telephone Encounter (Signed)
Patient was unsure of dosing frequency. Reinforced that her insulin is to be taken daily before bedtime. Pt had no additional concerns.

## 2018-10-12 MED FILL — !LANTUS SOLOSTAR 100UNITS/M: 100 | 20 days supply | Qty: 3 | Fill #1

## 2018-10-27 ENCOUNTER — Encounter: Payer: Self-pay | Admitting: Internal Medicine

## 2018-10-27 ENCOUNTER — Other Ambulatory Visit: Payer: Self-pay

## 2018-10-27 ENCOUNTER — Ambulatory Visit: Payer: Self-pay | Attending: Internal Medicine | Admitting: Internal Medicine

## 2018-10-27 DIAGNOSIS — IMO0002 Reserved for concepts with insufficient information to code with codable children: Secondary | ICD-10-CM

## 2018-10-27 DIAGNOSIS — E1165 Type 2 diabetes mellitus with hyperglycemia: Secondary | ICD-10-CM

## 2018-10-27 DIAGNOSIS — R030 Elevated blood-pressure reading, without diagnosis of hypertension: Secondary | ICD-10-CM

## 2018-10-27 DIAGNOSIS — E1129 Type 2 diabetes mellitus with other diabetic kidney complication: Secondary | ICD-10-CM

## 2018-10-27 DIAGNOSIS — R809 Proteinuria, unspecified: Secondary | ICD-10-CM

## 2018-10-27 MED ORDER — LANTUS SOLOSTAR 100 UNIT/ML ~~LOC~~ SOPN: 20.0000 [IU] | PEN_INJECTOR | Freq: Every day | SUBCUTANEOUS | 99 refills | Status: DC

## 2018-10-27 MED FILL — !LANTUS SOLOSTAR 100UNITS/M: 100 | 15 days supply | Qty: 3 | Fill #0

## 2018-10-27 NOTE — Progress Notes (Signed)
Virtual Visit via Telephone Note Due to current restrictions/limitations of in-office visits due to the COVID-19 pandemic, this scheduled clinical appointment was converted to a telehealth visit  I connected with Laura Ashley on 10/27/18 at 8:52 a.m by telephone and verified that I am speaking with the correct person using two identifiers. I am in my office.  The patient is at home.  Only the patient and myself participated in this encounter.  I discussed the limitations, risks, security and privacy concerns of performing an evaluation and management service by telephone and the availability of in person appointments. I also discussed with the patient that there may be a patient responsible charge related to this service. The patient expressed understanding and agreed to proceed.   History of Present Illness: Patient with history of DM type II, HTN, obesity, microalbuminuria.  Patient was last seen 09/25/2018.  Purpose of today's visit was mainly for follow-up with blood pressure and diabetes.  DM: reports she is well on Lantus 15 units daily and Metformin.  Checks BS 1-2 x a day.  A.m range initially in the 300s but now upper 100s to low 200. BS at bedtime in the low 200s.  Last night was 235. -doing good with eating habits.  Eating wheat bread, no longer eats fried foods, eating more fruits. -no blurred vision.  She is overdue for an eye exam.  She does not have insurance. She had mild microalbumin urea and I discussed the significance of this with her.    Elev BP:  No device to check BP.  Limits salt in foods.    HM:  Last pap 02/2014  Outpatient Encounter Medications as of 10/27/2018  Medication Sig  . Blood Glucose Monitoring Suppl (TRUE METRIX METER) w/Device KIT Use as directed  . famotidine (PEPCID) 20 MG tablet Take 1 tablet (20 mg total) by mouth 2 (two) times daily for 14 days.  Marland Kitchen glucose blood (TRUE METRIX BLOOD GLUCOSE TEST) test strip Use as instructed  . Insulin Glargine  (LANTUS SOLOSTAR) 100 UNIT/ML Solostar Pen Inject 15 Units into the skin daily.  . Insulin Pen Needle (B-D UF III MINI PEN NEEDLES) 31G X 5 MM MISC Use as directed  . metFORMIN (GLUCOPHAGE) 1000 MG tablet Take 0.5 tablets (500 mg total) by mouth 2 (two) times daily.  . TRUEplus Lancets 28G MISC Use as directed   No facility-administered encounter medications on file as of 10/27/2018.       Observations/Objective: No direct observation done as this was a televisit  Assessment and Plan: 1. 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  Advised patient that the goal is to get blood sugars before meals between 90-130.  Commended her on changing her eating habits.  Encouraged regular exercise.  Increase Lantus from 15 units daily to 20 units daily.  Follow-up in 3 to 4 weeks and bring blood sugar readings in with her. -Plan is in the future to get her on Trulicity. -Advised her to get an eye exam done once a year.  She is uninsured.  We discussed some places where the eye exam can be done at a reasonable cost. - Insulin Glargine (LANTUS SOLOSTAR) 100 UNIT/ML Solostar Pen; Inject 20 Units into the skin daily.  Dispense: 5 pen; Refill: PRN  2. Elevated blood pressure reading We will plan to recheck blood pressure on her next visit in 4 weeks.  Advised to continue low-salt diet.    Follow Up Instructions:  4 wks for pap   I discussed the assessment and treatment plan with the patient. The patient was provided an opportunity to ask questions and all were answered. The patient agreed with the plan and demonstrated an understanding of the instructions.   The patient was advised to call back or seek an in-person evaluation if the symptoms worsen or if the condition fails to improve as anticipated.  I provided 11 minutes of non-face-to-face time during this encounter.   Karle Plumber, MD

## 2018-11-07 MED FILL — !LANTUS SOLOSTAR 100UNITS/M: 100 | 15 days supply | Qty: 3 | Fill #0

## 2018-12-04 MED FILL — !LANTUS SOLOSTAR 100UNITS/M: 100 | 15 days supply | Qty: 3 | Fill #1

## 2018-12-04 MED FILL — metFORMIN HCL 1000 MG TABS: 1000 | 30 days supply | Qty: 30 | Fill #1

## 2018-12-06 ENCOUNTER — Ambulatory Visit (HOSPITAL_COMMUNITY)
Admission: EM | Admit: 2018-12-06 | Discharge: 2018-12-06 | Disposition: A | Discharge status: Home / Self Care | Payer: Self-pay | Attending: Family Medicine | Admitting: Family Medicine

## 2018-12-06 ENCOUNTER — Encounter (HOSPITAL_COMMUNITY): Payer: Self-pay

## 2018-12-06 ENCOUNTER — Other Ambulatory Visit: Payer: Self-pay

## 2018-12-06 DIAGNOSIS — Z880 Allergy status to penicillin: Secondary | ICD-10-CM | POA: Insufficient documentation

## 2018-12-06 DIAGNOSIS — Z79899 Other long term (current) drug therapy: Secondary | ICD-10-CM | POA: Insufficient documentation

## 2018-12-06 DIAGNOSIS — R5381 Other malaise: Secondary | ICD-10-CM | POA: Insufficient documentation

## 2018-12-06 DIAGNOSIS — B349 Viral infection, unspecified: Secondary | ICD-10-CM | POA: Insufficient documentation

## 2018-12-06 DIAGNOSIS — E1165 Type 2 diabetes mellitus with hyperglycemia: Secondary | ICD-10-CM | POA: Insufficient documentation

## 2018-12-06 DIAGNOSIS — R52 Pain, unspecified: Secondary | ICD-10-CM | POA: Insufficient documentation

## 2018-12-06 DIAGNOSIS — Z20828 Contact with and (suspected) exposure to other viral communicable diseases: Secondary | ICD-10-CM | POA: Insufficient documentation

## 2018-12-06 DIAGNOSIS — R5383 Other fatigue: Secondary | ICD-10-CM | POA: Insufficient documentation

## 2018-12-06 DIAGNOSIS — Z794 Long term (current) use of insulin: Secondary | ICD-10-CM | POA: Insufficient documentation

## 2018-12-06 HISTORY — DX: Type 2 diabetes mellitus without complications: E11.9

## 2018-12-06 LAB — GLUCOSE, CAPILLARY: Glucose-Capillary: 257 mg/dL — ABNORMAL HIGH (ref 70–99)

## 2018-12-06 MED FILL — TRUE METRIX TEST STRIP: 25 days supply | Qty: 100 | Fill #1

## 2018-12-06 NOTE — ED Triage Notes (Signed)
Pt states she feels nauseous and fatigued today. Pt states she has body aches. This started today.

## 2018-12-06 NOTE — Discharge Instructions (Addendum)
We will manage this as a viral syndrome. For sore throat or cough try using a honey-based tea. Use 3 teaspoons of honey with juice squeezed from half lemon. Place shaved pieces of ginger into 1/2-1 cup of water and warm over stove top. Then mix the ingredients and repeat every 4 hours as needed. Please take Tylenol 500mg every 6 hours. Hydrate very well with at least 2 liters of water. Eat light meals such as soups to replenish electrolytes and soft fruits, veggies. Start an antihistamine like Zyrtec, Allegra or Claritin for postnasal drainage, sinus congestion.  You can take this together with pseudoephedrine (Sudafed) at a dose of 60 mg 3 times a day or twice daily as needed for the same kind of congestion.   ° ° °

## 2018-12-06 NOTE — ED Provider Notes (Signed)
MRN: [id number] DOB: Feb 11, 1991  Subjective:   Laura Ashley is a 27 y.o. female presenting for acute onset today of malaise, fatigue and body aches. Has tried nasal spray, APAP, cough drops with minimal relief. Denies any COVID 19 contacts. 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 Smokes cigarettes on occasion, not in the last week. Patient is uncontrolled diabetic on insulin. Has not drank alcohol since the weekend.    No current facility-administered medications for this encounter.   Current Outpatient Medications:  .  Blood Glucose Monitoring Suppl (TRUE METRIX METER) w/Device KIT, Use as directed, Disp: 1 kit, Rfl: 0 .  famotidine (PEPCID) 20 MG tablet, Take 1 tablet (20 mg total) by mouth 2 (two) times daily for 14 days., Disp: 28 tablet, Rfl: 0 .  glucose blood (TRUE METRIX BLOOD GLUCOSE TEST) test strip, Use as instructed, Disp: 100 each, Rfl: 12 .  Insulin Glargine (LANTUS SOLOSTAR) 100 UNIT/ML Solostar Pen, Inject 20 Units into the skin daily., Disp: 5 pen, Rfl: PRN .  Insulin Pen Needle (B-D UF III MINI PEN NEEDLES) 31G X 5 MM MISC, Use as directed, Disp: 100 each, Rfl: 3 .  metFORMIN (GLUCOPHAGE) 1000 MG tablet, Take 0.5 tablets (500 mg total) by mouth 2 (two) times daily., Disp: 30 tablet, Rfl: 6 .  TRUEplus Lancets 28G MISC, Use as directed, Disp: 100 each, Rfl: 4   Allergies  Allergen Reactions  . Penicillins Rash    Has patient had a PCN reaction causing immediate rash, facial/tongue/throat swelling, SOB or lightheadedness with hypotension: Yes Has patient had a PCN reaction causing severe rash involving mucus membranes or skin necrosis: Yes Has patient had a PCN reaction that required hospitalization: No Has patient had a PCN reaction occurring within the last 10 years: No If all of the above answers are "NO", then may proceed with Cephalosporin use.    Past Medical History:  Diagnosis Date  . Diabetes mellitus without complication ([place])   . Medical history  non-contributory   . PCOS (polycystic ovarian syndrome)      Past Surgical History:  Procedure Laterality Date  . NO PAST SURGERIES      Review of Systems  Constitutional: Positive for malaise/fatigue. Negative for fever.  HENT: Positive for congestion. Negative for ear pain, sinus pain and sore throat.   Eyes: Negative for blurred vision, double vision, discharge and redness.  Respiratory: Positive for cough. Negative for hemoptysis, shortness of breath and wheezing.   Cardiovascular: Negative for chest pain.  Gastrointestinal: Negative for abdominal pain, diarrhea, nausea and vomiting.  Genitourinary: Negative for dysuria, flank pain and hematuria.  Musculoskeletal: Positive for myalgias.  Skin: Negative for rash.  Neurological: Positive for headaches. Negative for dizziness and weakness.  Psychiatric/Behavioral: Negative for depression and substance abuse.    Objective:   Vitals: BP (!) 140/104 (BP Location: Left Arm)   Pulse (!) 101   Temp 98.2 F (36.8 C) (Temporal)   Resp 16   Wt 275 lb (124.7 kg)   LMP 12/06/2018   SpO2 100%   BMI 47.20 kg/m   Physical Exam Constitutional:      General: She is not in acute distress.    Appearance: Normal appearance. She is well-developed. She is obese. She is not ill-appearing, toxic-appearing or diaphoretic.  HENT:     Head: Normocephalic and atraumatic.     Nose: Nose normal.     Mouth/Throat:     Mouth: Mucous membranes are moist.  Eyes:     Extraocular  Movements: Extraocular movements intact.     Pupils: Pupils are equal, round, and reactive to light.  Cardiovascular:     Rate and Rhythm: Normal rate and regular rhythm.     Pulses: Normal pulses.     Heart sounds: Normal heart sounds. No murmur. No friction rub. No gallop.   Pulmonary:     Effort: Pulmonary effort is normal. No respiratory distress.     Breath sounds: Normal breath sounds. No stridor. No wheezing, rhonchi or rales.  Skin:    General: Skin is warm  and dry.     Findings: No rash.  Neurological:     Mental Status: She is alert and oriented to person, place, and time.  Psychiatric:        Mood and Affect: Mood normal.        Behavior: Behavior normal.        Thought Content: Thought content normal.        Judgment: Judgment normal.     POC blood sugar is 257.    Assessment and Plan :   1. Viral syndrome   2. Body aches   3. Other fatigue   4. Malaise   5. Uncontrolled type 2 diabetes mellitus with hyperglycemia ([place])     Will manage for viral illness. Counseled patient on nature of COVID-19 including modes of transmission, diagnostic testing, management and supportive care.  Offered symptomatic relief. COVID 19 testing is pending. Counseled patient on potential for adverse effects with medications prescribed/recommended today, ER and return-to-clinic precautions discussed, patient verbalized understanding.     Jaynee Eagles, [place] 12/06/18 1948

## 2018-12-08 LAB — NOVEL CORONAVIRUS, NAA (HOSP ORDER, SEND-OUT TO REF LAB; TAT 18-24 HRS): SARS-CoV-2, NAA: NOT DETECTED

## 2018-12-21 ENCOUNTER — Encounter: Payer: Self-pay | Admitting: Internal Medicine

## 2018-12-21 ENCOUNTER — Ambulatory Visit: Payer: Self-pay | Attending: Internal Medicine | Admitting: Internal Medicine

## 2018-12-21 ENCOUNTER — Other Ambulatory Visit: Payer: Self-pay

## 2018-12-21 ENCOUNTER — Ambulatory Visit (HOSPITAL_BASED_OUTPATIENT_CLINIC_OR_DEPARTMENT_OTHER): Payer: Self-pay | Admitting: Pharmacist

## 2018-12-21 ENCOUNTER — Encounter: Payer: Self-pay | Admitting: Pharmacist

## 2018-12-21 VITALS — BP 137/88 | HR 90 | Temp 98.2°F | Resp 18 | Ht 64.0 in | Wt 287.0 lb

## 2018-12-21 DIAGNOSIS — E1165 Type 2 diabetes mellitus with hyperglycemia: Secondary | ICD-10-CM

## 2018-12-21 DIAGNOSIS — Z2821 Immunization not carried out because of patient refusal: Secondary | ICD-10-CM

## 2018-12-21 DIAGNOSIS — I10 Essential (primary) hypertension: Secondary | ICD-10-CM

## 2018-12-21 DIAGNOSIS — Z79899 Other long term (current) drug therapy: Secondary | ICD-10-CM

## 2018-12-21 DIAGNOSIS — Z6841 Body Mass Index (BMI) 40.0 and over, adult: Secondary | ICD-10-CM

## 2018-12-21 DIAGNOSIS — Z124 Encounter for screening for malignant neoplasm of cervix: Secondary | ICD-10-CM

## 2018-12-21 DIAGNOSIS — R809 Proteinuria, unspecified: Secondary | ICD-10-CM

## 2018-12-21 DIAGNOSIS — IMO0002 Reserved for concepts with insufficient information to code with codable children: Secondary | ICD-10-CM

## 2018-12-21 DIAGNOSIS — E66813 Obesity, class 3: Secondary | ICD-10-CM

## 2018-12-21 DIAGNOSIS — N938 Other specified abnormal uterine and vaginal bleeding: Secondary | ICD-10-CM

## 2018-12-21 DIAGNOSIS — E1129 Type 2 diabetes mellitus with other diabetic kidney complication: Secondary | ICD-10-CM

## 2018-12-21 LAB — POCT GLYCOSYLATED HEMOGLOBIN (HGB A1C): Hemoglobin A1C: 10.2 % — AB (ref 4.0–5.6)

## 2018-12-21 LAB — GLUCOSE, POCT (MANUAL RESULT ENTRY): POC Glucose: 316 mg/dl — AB (ref 70–99)

## 2018-12-21 MED ORDER — TRULICITY 0.75 MG/0.5ML ~~LOC~~ SOAJ: 0.7500 mg | SUBCUTANEOUS | 6 refills | Status: DC

## 2018-12-21 MED ORDER — AMLODIPINE BESYLATE 5 MG PO TABS: 5.0000 mg | ORAL_TABLET | Freq: Every day | ORAL | 3 refills | Status: DC

## 2018-12-21 MED ORDER — LISINOPRIL 5 MG PO TABS: 5.0000 mg | ORAL_TABLET | Freq: Every day | ORAL | 3 refills | Status: DC

## 2018-12-21 MED FILL — LISINOPRIL 5 MG TABLET: 5 | 30 days supply | Qty: 30 | Fill #0

## 2018-12-21 MED FILL — TRULICITY 0.75 MG/0.5 ML PE: 0.75 | 28 days supply | Qty: 2 | Fill #0

## 2018-12-21 NOTE — Progress Notes (Signed)
Patient ID: Laura Ashley, female    DOB: 1990-06-11  MRN: [id number]  CC: Gynecologic Exam and Blood Pressure Check   Subjective: Laura Ashley is a 28 y.o. female who presents for Pap and follow-up on blood pressure and diabetes Her concerns today include:  Patient with history of DM type II microalbumin, HTN, obesity, microalbuminuria, PCOS.  Elev BP:  Limiting salt in foods No device to check BP Denies any chest pains, shortness of breath or lower extremity edema.  DM:  Checking in a.m before BF and at bedtime.  A.m range still in 200s and bedtime BS in the 200s also.  On our visit about 7 weeks ago, I had her increase Lantus to 20 units daily.  She has been doing that.  She reports compliance with metformin Eating habits: cut back on white carbs and sugary drinks Exercise:  Walking 30 mins 2 x a wk  Pt is G2P0 (misscarriages) Last pap 2015 No abn paps in past Some dischg over the wk Sexually active with one partner- female partner.  Not on any method of BC as she is in a same-sex relationship Menses irregular.  Has had bleeding daily for last several mths.  Bleeding alternates between spotting and heavy bleeding.  Up to this point she tells me that periods had been regular.  She reports being diagnosed with PCOS in the past by a different primary care physician.  At that time she was placed on birth control pills.  She did not tolerate the birth control pills.  She felt they made her periods even more heavy and painful.    HM: will get flu shot at work.  Declinces others Patient Active Problem List   Diagnosis Date Noted  . Uncontrolled type 2 diabetes mellitus with microalbuminuria ([place]) 10/27/2018  . Elevated blood pressure reading 09/25/2018  . Class 3 severe obesity due to excess calories with serious comorbidity and body mass index (BMI) of 45.0 to 49.9 in adult ([place]) 09/25/2018  . History of PCOS 09/25/2018  . Morbidly obese ([place]) 02/28/2014  . Hypertension  02/28/2014  . Dysfunctional uterine bleeding 02/28/2014     Current Outpatient Medications on File Prior to Visit  Medication Sig Dispense Refill  . Blood Glucose Monitoring Suppl (TRUE METRIX METER) w/Device KIT Use as directed 1 kit 0  . glucose blood (TRUE METRIX BLOOD GLUCOSE TEST) test strip Use as instructed 100 each 12  . Insulin Glargine (LANTUS SOLOSTAR) 100 UNIT/ML Solostar Pen Inject 20 Units into the skin daily. 5 pen PRN  . Insulin Pen Needle (B-D UF III MINI PEN NEEDLES) 31G X 5 MM MISC Use as directed 100 each 3  . TRUEplus Lancets 28G MISC Use as directed 100 each 4  . famotidine (PEPCID) 20 MG tablet Take 1 tablet (20 mg total) by mouth 2 (two) times daily for 14 days. 28 tablet 0  . metFORMIN (GLUCOPHAGE) 1000 MG tablet Take 0.5 tablets (500 mg total) by mouth 2 (two) times daily. (Patient not taking: Reported on 12/21/2018) 30 tablet 6   No current facility-administered medications on file prior to visit.     Allergies  Allergen Reactions  . Penicillins Rash    Has patient had a PCN reaction causing immediate rash, facial/tongue/throat swelling, SOB or lightheadedness with hypotension: Yes Has patient had a PCN reaction causing severe rash involving mucus membranes or skin necrosis: Yes Has patient had a PCN reaction that required hospitalization: No Has patient had a PCN reaction occurring within  the last 10 years: No If all of the above answers are "NO", then may proceed with Cephalosporin use.    Social History   Socioeconomic History  . Marital status: Significant Other    Spouse name: Not on file  . Number of children: 0  . Years of education: Not on file  . Highest education level: Associate degree: academic program  Occupational History  . Not on file  Social Needs  . Financial resource strain: Not on file  . Food insecurity    Worry: Not on file    Inability: Not on file  . Transportation needs    Medical: Not on file    Non-medical: Not on file   Tobacco Use  . Smoking status: Former Research scientist (life sciences)  . Smokeless tobacco: Never Used  Substance and Sexual Activity  . Alcohol use: Yes  . Drug use: No  . Sexual activity: Yes  Lifestyle  . Physical activity    Days per week: Not on file    Minutes per session: Not on file  . Stress: Not on file  Relationships  . Social Herbalist on phone: Not on file    Gets together: Not on file    Attends religious service: Not on file    Active member of club or organization: Not on file    Attends meetings of clubs or organizations: Not on file    Relationship status: Not on file  . Intimate partner violence    Fear of current or ex partner: Not on file    Emotionally abused: Not on file    Physically abused: Not on file    Forced sexual activity: Not on file  Other Topics Concern  . Not on file  Social History Narrative  . Not on file    Family History  Problem Relation Age of Onset  . Diabetes Father   . Diabetes Other   . Healthy Mother     Past Surgical History:  Procedure Laterality Date  . NO PAST SURGERIES      ROS: Review of Systems Negative except as stated above  PHYSICAL EXAM: BP 137/88 (BP Location: Left Arm, Patient Position: Sitting, Cuff Size: Large)   Pulse 90   Temp 98.2 F (36.8 C) (Oral)   Resp 18   Ht 5' 4" (1.626 m)   Wt 287 lb (130.2 kg)   LMP 12/06/2018   SpO2 97%   BMI 49.26 kg/m   Wt Readings from Last 3 Encounters:  12/21/18 287 lb (130.2 kg)  12/06/18 275 lb (124.7 kg)  09/25/18 286 lb (129.7 kg)    Physical Exam General appearance - alert, well appearing, and in no distress Mental status - normal mood, behavior, speech, dress, motor activity, and thought processes Pelvic -CMA [place] present: No external vaginal lesions.  She has a small amount of blood in the vaginal vault.  Cervix appears grossly normal.  No cervical motion tenderness or adnexal masses.  Uterus feels normal size. Extremities - peripheral pulses normal, no pedal  edema, no clubbing or cyanosis   CMP Latest Ref Rng & Units 09/08/2018 02/28/2014 11/16/2012  Glucose 70 - 99 mg/dL 362(H) 94 100(H)  BUN 6 - 20 mg/dL _0 Creatinine 0.44 - 1.00 mg/dL 0.68 0.86 0.70  Sodium 135 - 145 mmol/L 132(L) 138 141  Potassium 3.5 - 5.1 mmol/L 4.0 4.0 3.9  Chloride 98 - 111 mmol/L 97(L) 103 105  CO2 22 - 32 mmol/L 23  26 25  Calcium 8.9 - 10.3 mg/dL 9.5 9.7 9.6  Total Protein 6.0 - 8.3 g/dL - - 7.5  Total Bilirubin 0.3 - 1.2 mg/dL - - 0.3  Alkaline Phos 39 - 117 U/L - - 64  AST 0 - 37 U/L - - 21  ALT 0 - 35 U/L - - 20   Lipid Panel  No results found for: CHOL, TRIG, HDL, CHOLHDL, VLDL, LDLCALC, LDLDIRECT  CBC    Component Value Date/Time   WBC 5.5 09/08/2018 1328   RBC 5.00 09/08/2018 1328   HGB 15.4 (H) 09/08/2018 1328   HCT 43.5 09/08/2018 1328   PLT 321 09/08/2018 1328   MCV 87.0 09/08/2018 1328   MCH 30.8 09/08/2018 1328   MCHC 35.4 09/08/2018 1328   RDW 11.8 09/08/2018 1328   LYMPHSABS 2.4 09/08/2018 1328   MONOABS 0.6 09/08/2018 1328   EOSABS 0.1 09/08/2018 1328   BASOSABS 0.0 09/08/2018 1328    ASSESSMENT AND PLAN: 1. 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  We discussed adding Trulicity.  I went over how Trulicity works and possible side effects.  It should help with weight loss.  Clinical pharmacist to meet with the patient today for teaching.  She will continue current dose of Lantus and metformin.  Continue blood sugar monitoring. Advised her to increase her exercise to at least 3 to 4 days a week. Continue trying to eat healthy. - HgB A1c - Glucose (CBG) - Dulaglutide (TRULICITY) 4.94 WH/6.7RF SOPN; Inject 0.75 mg into the skin once a week.  Dispense: 4 pen; Refill: 6  2. Influenza vaccination declined   3. Tetanus, diphtheria, and acellular pertussis (Tdap) vaccination declined   4. 23-polyvalent pneumococcal polysaccharide vaccine declined   5. Class 3 severe  obesity due to excess calories with serious comorbidity and body mass index (BMI) of 45.0 to 49.9 in adult [facility]) See #1 above  6. Pap smear for cervical cancer screening - Cytology - PAP - Cervicovaginal ancillary only  7. Essential hypertension Not at goal of 130/80 or lower.  DASH diet discussed and encouraged.  Start low-dose lisinopril.  Advised patient that should she desire pregnancy in the future she needs to let me know so that we can change the medication - lisinopril (ZESTRIL) 5 MG tablet; Take 1 tablet (5 mg total) by mouth daily.  Dispense: 90 tablet; Refill: 3  8. Dysfunctional uterine bleeding Declines pregnancy test stating that she is active with a same-sex partner only. Discussed putting her on birth control pills for 1 to 2 months to try regulate her menstrual cycle until we can get her into see the gynecologist.  Patient declines - TSH - Ambulatory referral to Gynecology - CBC - US Pelvic Complete With Transvaginal; Future - Follicle stimulating hormone - Luteinizing hormone   Patient was given the opportunity to ask questions.  Patient verbalized understanding of the plan and was able to repeat key elements of the plan.   Orders Placed This Encounter  Procedures  . US Pelvic Complete With Transvaginal  . TSH  . CBC  . Follicle stimulating hormone  . Luteinizing hormone  . Ambulatory referral to Gynecology  . HgB A1c  . Glucose (CBG)     Requested Prescriptions   Signed Prescriptions Disp Refills  . Dulaglutide (TRULICITY) 1.63 WG/6.6ZL SOPN 4 pen 6    Sig: Inject 0.75 mg into the skin once a week.  Marland Kitchen lisinopril (ZESTRIL) 5 MG tablet 90  tablet 3    Sig: Take 1 tablet (5 mg total) by mouth daily.    Return in about 3 months (around 03/23/2019).  Karle Plumber, MD, FACP

## 2018-12-21 NOTE — Patient Instructions (Signed)
Give appointment with Lurena Joiner in 1 month for recheck on BP  Start Trulicity injections once a week. Start blood pressure medication Lisinopril 5 mg daily.

## 2018-12-21 NOTE — Progress Notes (Signed)
Patient was educated on the use of the Trulicity pen. Reviewed necessary supplies and operation of the pen. Also reviewed goal blood glucose levels. Patient was able to demonstrate use. All questions and concerns were addressed.  

## 2018-12-22 LAB — CBC
Hematocrit: 43 % (ref 34.0–46.6)
Hemoglobin: 14.4 g/dL (ref 11.1–15.9)
MCH: 30.1 pg (ref 26.6–33.0)
MCHC: 33.5 g/dL (ref 31.5–35.7)
MCV: 90 fL (ref 79–97)
Platelets: 358 10*3/uL (ref 150–450)
RBC: 4.78 x10E6/uL (ref 3.77–5.28)
RDW: 12.4 % (ref 11.7–15.4)
WBC: 4.5 10*3/uL (ref 3.4–10.8)

## 2018-12-22 LAB — LUTEINIZING HORMONE: LH: 7.8 m[IU]/mL

## 2018-12-22 LAB — FOLLICLE STIMULATING HORMONE: FSH: 4.9 m[IU]/mL

## 2018-12-22 LAB — TSH: TSH: 2.72 u[IU]/mL (ref 0.450–4.500)

## 2018-12-25 ENCOUNTER — Other Ambulatory Visit: Payer: Self-pay | Admitting: Internal Medicine

## 2018-12-25 ENCOUNTER — Telehealth: Payer: Self-pay | Admitting: Internal Medicine

## 2018-12-25 LAB — CYTOLOGY - PAP: Diagnosis: NEGATIVE

## 2018-12-25 MED ORDER — FLUCONAZOLE 150 MG PO TABS: 150.0000 mg | ORAL_TABLET | Freq: Once | ORAL | 0 refills | Status: AC

## 2018-12-25 NOTE — Telephone Encounter (Signed)
Call placed. Left HIPAA-compliant VM with instructions to call me back with any additional concerns. I did inform her that only specific manufacturers have recalled their metformin products.

## 2018-12-25 NOTE — Telephone Encounter (Signed)
Pt called to request to speak with Lurena Joiner for more information on her  -metFORMIN (GLUCOPHAGE) 1000 MG tablet  She heard that there has been a recall on this medication, I advised to contact her pharmacy as medications have different lot numbers and hers might be ok. Call forwarded to [place]. Please follow up if additional information needs to be given. Thanks

## 2018-12-26 ENCOUNTER — Telehealth: Payer: Self-pay

## 2018-12-26 MED FILL — FLUCONAZOLE 150 MG TABLET: 150 | 1 days supply | Qty: 1 | Fill #0

## 2018-12-26 NOTE — Telephone Encounter (Signed)
Contacted pt to go over pap results pt didn't answer lvm asking pt to give me a call at her earliest convenience  

## 2019-01-01 ENCOUNTER — Telehealth: Payer: Self-pay | Admitting: Internal Medicine

## 2019-01-01 MED ORDER — LEVONORGESTREL-ETHINYL ESTRAD 0.1-20 MG-MCG PO TABS: 1.0000 | ORAL_TABLET | Freq: Every day | ORAL | 2 refills | Status: DC

## 2019-01-01 MED FILL — LEVONOR-ETH ESTRAD 0.1-0.02: 0.1-20 | 28 days supply | Qty: 28 | Fill #0

## 2019-01-01 NOTE — Telephone Encounter (Signed)
Pt would like to get back on her Birth control she was previously on, states she discussed it with PCP not too long ago on 12/21/2018, please follow up if this is possible or if scheduling is needed.

## 2019-01-01 NOTE — Telephone Encounter (Signed)
Will forward to pcp

## 2019-01-03 NOTE — Telephone Encounter (Signed)
Contacted pt and left a detailed vm informing pt of Dr. Wynetta Emery response and if she has any questions or concerns to give [place] a call

## 2019-01-29 MED FILL — TRULICITY 0.75 MG/0.5 ML PE: 0.75 | 28 days supply | Qty: 2 | Fill #1

## 2019-01-29 MED FILL — LEVONOR-ETH ESTRAD 0.1-0.02: 0.1-20 | 28 days supply | Qty: 28 | Fill #1

## 2019-01-29 MED FILL — !LANTUS SOLOSTAR 100UNITS/M: 100 | 15 days supply | Qty: 3 | Fill #2

## 2019-01-31 ENCOUNTER — Encounter: Payer: Self-pay | Admitting: Obstetrics & Gynecology

## 2019-01-31 ENCOUNTER — Ambulatory Visit (INDEPENDENT_AMBULATORY_CARE_PROVIDER_SITE_OTHER): Payer: Self-pay | Admitting: Obstetrics & Gynecology

## 2019-01-31 ENCOUNTER — Other Ambulatory Visit: Payer: Self-pay

## 2019-01-31 VITALS — BP 138/56 | HR 87 | Wt 284.1 lb

## 2019-01-31 DIAGNOSIS — Z8742 Personal history of other diseases of the female genital tract: Secondary | ICD-10-CM

## 2019-01-31 DIAGNOSIS — N938 Other specified abnormal uterine and vaginal bleeding: Secondary | ICD-10-CM

## 2019-01-31 NOTE — Progress Notes (Signed)
   Subjective:    Patient ID: Laura Ashley, female    DOB: 12-27-1990, 28 y.o.   MRN: [id number]  HPI 28 yo single G2 P2A2 here today to discuss heavy irregular periods. She has a diagnosis of PCOS. Her primary care doc checked her CBC and TSH which were normal. She was started on Alesse about 1 month ago.    Review of Systems Pap was normal this year. 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 She doesn't recall taking Gardasil but will ask her mom. She took OCPs for one month in the distant past.    Objective:   Physical Exam Breathing, conversing, and ambulating normally Well nourished, well hydrated Black female, no apparent distress      Assessment & Plan:  Heavy irregular periods with PCOS- I agree with the treatment of OCPs I told her that it will take about 3 months to get the full effect. I have rec'd that she get Gardasil at the health dept (she is self pay)

## 2019-03-13 ENCOUNTER — Telehealth: Payer: Self-pay | Admitting: Pharmacist

## 2019-03-13 MED ORDER — FLUCONAZOLE 150 MG PO TABS: 150.0000 mg | ORAL_TABLET | Freq: Once | ORAL | 0 refills | Status: AC

## 2019-03-13 MED FILL — FLUCONAZOLE 150 MG TABLET: 150 | 1 days supply | Qty: 1 | Fill #0

## 2019-03-13 NOTE — Telephone Encounter (Signed)
Pt call received. Pt reports symptoms of genital mycotic infection. She was requesting that I send in Diflucan as she's taken this previously. Will send information to patient's PCP.

## 2019-03-13 NOTE — Addendum Note (Signed)
Addended by: Karle Plumber B on: 03/13/2019 01:03 PM   Modules accepted: Orders

## 2019-03-14 ENCOUNTER — Ambulatory Visit (HOSPITAL_COMMUNITY)
Admission: EM | Admit: 2019-03-14 | Discharge: 2019-03-14 | Disposition: A | Discharge status: Home / Self Care | Payer: Self-pay | Attending: Family Medicine | Admitting: Family Medicine

## 2019-03-14 ENCOUNTER — Encounter (HOSPITAL_COMMUNITY): Payer: Self-pay

## 2019-03-14 ENCOUNTER — Other Ambulatory Visit: Payer: Self-pay

## 2019-03-14 DIAGNOSIS — N760 Acute vaginitis: Secondary | ICD-10-CM | POA: Insufficient documentation

## 2019-03-14 MED ORDER — METRONIDAZOLE 500 MG PO TABS: 500.0000 mg | ORAL_TABLET | Freq: Two times a day (BID) | ORAL | 0 refills | Status: DC

## 2019-03-14 MED ORDER — FLUCONAZOLE 150 MG PO TABS: 150.0000 mg | ORAL_TABLET | Freq: Every day | ORAL | 0 refills | Status: DC

## 2019-03-14 NOTE — ED Triage Notes (Signed)
Pt  Presents with vaginal irritation X 1 week.

## 2019-03-14 NOTE — Discharge Instructions (Addendum)
Take the medication as prescribed.   Treating you for BV and yeast We will send your swab for testing and call you with any positive results.

## 2019-03-15 NOTE — ED Provider Notes (Signed)
[facility]    CSN: [id number] Arrival date & time: 03/14/19  4098      History   Chief Complaint Chief Complaint  Patient presents with  . Vaginitis    HPI Laura Ashley is a 28 y.o. female.   Patient is a 28 year old female with past medical history of diabetes, PCOS, hypertension, obesity.  She presents today with approximate 1 week of vaginal itching, irritation.  Symptoms have been constant.  She has not tried anything for her symptoms.  Denies any associated abdominal pain, back pain, fever, dysuria, hematuria or urinary frequency. No LMP recorded. (Menstrual status: Oral contraceptives).  Nontender concern for STDs at this time but does not mind being tested.  ROS per HPI      Past Medical History:  Diagnosis Date  . Diabetes mellitus without complication ([place])   . Medical history non-contributory   . PCOS (polycystic ovarian syndrome)     Patient Active Problem List   Diagnosis Date Noted  . Uncontrolled type 2 diabetes mellitus with microalbuminuria ([place]) 10/27/2018  . Elevated blood pressure reading 09/25/2018  . Class 3 severe obesity due to excess calories with serious comorbidity and body mass index (BMI) of 45.0 to 49.9 in adult ([place]) 09/25/2018  . History of PCOS 09/25/2018  . Morbidly obese ([place]) 02/28/2014  . Hypertension 02/28/2014  . Dysfunctional uterine bleeding 02/28/2014    Past Surgical History:  Procedure Laterality Date  . NO PAST SURGERIES      OB History    Gravida  2   Para      Term      Preterm      AB  2   Living  0     SAB  2   TAB      Ectopic      Multiple      Live Births               Home Medications    Prior to Admission medications   Medication Sig Start Date End Date Taking? Authorizing Provider  Blood Glucose Monitoring Suppl (TRUE METRIX METER) w/Device KIT Use as directed 09/25/18   Ladell Pier, MD  Dulaglutide (TRULICITY) 1.19 JY/7.8GN SOPN Inject 0.75 mg into the  skin once a week. 12/21/18   Ladell Pier, MD  famotidine (PEPCID) 20 MG tablet Take 1 tablet (20 mg total) by mouth 2 (two) times daily for 14 days. 09/08/18 09/22/18  Couture, Cortni S, PA-C  fluconazole (DIFLUCAN) 150 MG tablet Take 1 tablet (150 mg total) by mouth daily. 03/14/19   Loura Halt A, NP  glucose blood (TRUE METRIX BLOOD GLUCOSE TEST) test strip Use as instructed 09/25/18   Ladell Pier, MD  Insulin Glargine (LANTUS SOLOSTAR) 100 UNIT/ML Solostar Pen Inject 20 Units into the skin daily. 10/27/18   Ladell Pier, MD  Insulin Pen Needle (B-D UF III MINI PEN NEEDLES) 31G X 5 MM MISC Use as directed 09/25/18   Ladell Pier, MD  levonorgestrel-ethinyl estradiol (ALESSE) 0.1-20 MG-MCG tablet Take 1 tablet by mouth daily. 01/01/19   Ladell Pier, MD  lisinopril (ZESTRIL) 5 MG tablet Take 1 tablet (5 mg total) by mouth daily. Patient not taking: Reported on 01/31/2019 12/21/18   Ladell Pier, MD  metFORMIN (GLUCOPHAGE) 1000 MG tablet Take 0.5 tablets (500 mg total) by mouth 2 (two) times daily. 09/25/18   Ladell Pier, MD  metroNIDAZOLE (FLAGYL) 500 MG tablet Take 1 tablet (500 mg  total) by mouth 2 (two) times daily. 03/14/19   Orvan July, NP  TRUEplus Lancets 28G MISC Use as directed 09/25/18   Ladell Pier, MD    Family History Family History  Problem Relation Age of Onset  . Diabetes Father   . Diabetes Other   . Healthy Mother     Social History Social History   Tobacco Use  . Smoking status: Former Research scientist (life sciences)  . Smokeless tobacco: Never Used  Substance Use Topics  . Alcohol use: Yes  . Drug use: No     Allergies   Penicillins   Review of Systems Review of Systems   Physical Exam Triage Vital Signs ED Triage Vitals  Enc Vitals Group     BP 03/14/19 0930 (!) 138/94     Pulse Rate 03/14/19 0930 89     Resp 03/14/19 0930 17     Temp 03/14/19 0930 98.4 F (36.9 C)     Temp Source 03/14/19 0930 Oral     SpO2 03/14/19 0930  97 %     Weight --      Height --      Head Circumference --      Peak Flow --      Pain Score 03/14/19 0928 4     Pain Loc --      Pain Edu? --      Excl. in [place]? --    No data found.  Updated Vital Signs BP (!) 138/94 (BP Location: Right Arm)   Pulse 89   Temp 98.4 F (36.9 C) (Oral)   Resp 17   SpO2 97%   Visual Acuity Right Eye Distance:   Left Eye Distance:   Bilateral Distance:    Right Eye Near:   Left Eye Near:    Bilateral Near:     Physical Exam Vitals and nursing note reviewed.  Constitutional:      General: She is not in acute distress.    Appearance: Normal appearance. She is not ill-appearing, toxic-appearing or diaphoretic.  HENT:     Head: Normocephalic.     Nose: Nose normal.     Mouth/Throat:     Pharynx: Oropharynx is clear.  Eyes:     Conjunctiva/sclera: Conjunctivae normal.  Pulmonary:     Effort: Pulmonary effort is normal.  Abdominal:     Palpations: Abdomen is soft.     Tenderness: There is no abdominal tenderness.  Musculoskeletal:        General: Normal range of motion.     Cervical back: Normal range of motion.  Skin:    General: Skin is warm and dry.     Findings: No rash.  Neurological:     Mental Status: She is alert.  Psychiatric:        Mood and Affect: Mood normal.      UC Treatments / Results  Labs (all labs ordered are listed, but only abnormal results are displayed) Labs Reviewed  CERVICOVAGINAL ANCILLARY ONLY    EKG   Radiology No results found.  Procedures Procedures (including critical care time)  Medications Ordered in UC Medications - No data to display  Initial Impression / Assessment and Plan / UC Course  I have reviewed the triage vital signs and the nursing notes.  Pertinent labs & imaging results that were available during my care of the patient were reviewed by me and considered in my medical decision making (see chart for details).     Vaginitis-patient with history  and symptoms  treating for BV and yeast Swab sent for testing with labs pending Final Clinical Impressions(s) / UC Diagnoses   Final diagnoses:  Vaginitis and vulvovaginitis     Discharge Instructions     Take the medication as prescribed.   Treating you for BV and yeast We will send your swab for testing and call you with any positive results.    ED Prescriptions    Medication Sig Dispense Auth. Provider   fluconazole (DIFLUCAN) 150 MG tablet  (Status: Discontinued) Take 1 tablet (150 mg total) by mouth daily. 2 tablet Zoua Caporaso A, NP   metroNIDAZOLE (FLAGYL) 500 MG tablet  (Status: Discontinued) Take 1 tablet (500 mg total) by mouth 2 (two) times daily. 14 tablet Misao Fackrell A, NP   fluconazole (DIFLUCAN) 150 MG tablet Take 1 tablet (150 mg total) by mouth daily. 2 tablet Donielle Radziewicz A, NP   metroNIDAZOLE (FLAGYL) 500 MG tablet Take 1 tablet (500 mg total) by mouth 2 (two) times daily. 14 tablet Nesreen Albano A, NP     PDMP not reviewed this encounter.   Orvan July, NP 03/15/19 [phone]

## 2019-03-19 LAB — CERVICOVAGINAL ANCILLARY ONLY
Bacterial vaginitis: POSITIVE — AB
Candida vaginitis: POSITIVE — AB
Chlamydia: NEGATIVE
Neisseria Gonorrhea: NEGATIVE
Trichomonas: NEGATIVE

## 2019-03-22 MED FILL — LANTUS SOLOSTAR 100 UNITS/M: 100 | 15 days supply | Qty: 3 | Fill #3

## 2019-03-22 MED FILL — FLUCONAZOLE 150 MG TABLET: 150 | 1 days supply | Qty: 1 | Fill #0

## 2019-03-22 MED FILL — TRULICITY 0.75 MG/0.5 ML PE: 0.75 | 28 days supply | Qty: 2 | Fill #2

## 2019-03-22 MED FILL — LEVONOR-ETH ESTRAD 0.1-0.02: 0.1-20 | 28 days supply | Qty: 28 | Fill #2

## 2019-04-09 ENCOUNTER — Telehealth: Payer: Self-pay | Admitting: Internal Medicine

## 2019-04-09 DIAGNOSIS — E1129 Type 2 diabetes mellitus with other diabetic kidney complication: Secondary | ICD-10-CM

## 2019-04-09 DIAGNOSIS — E119 Type 2 diabetes mellitus without complications: Secondary | ICD-10-CM

## 2019-04-09 DIAGNOSIS — IMO0002 Reserved for concepts with insufficient information to code with codable children: Secondary | ICD-10-CM

## 2019-04-09 MED ORDER — METFORMIN HCL 1000 MG PO TABS: 500.0000 mg | ORAL_TABLET | Freq: Two times a day (BID) | ORAL | 0 refills | Status: DC

## 2019-04-09 MED ORDER — LANTUS SOLOSTAR 100 UNIT/ML ~~LOC~~ SOPN: 20.0000 [IU] | PEN_INJECTOR | Freq: Every day | SUBCUTANEOUS | 0 refills | Status: DC

## 2019-04-09 NOTE — Telephone Encounter (Signed)
Rx sent 

## 2019-04-09 NOTE — Telephone Encounter (Signed)
1) Medication(s) Requested (by name): metFORMIN (GLUCOPHAGE) 1000 MG tablet [[id number]]  Insulin Glargine (LANTUS SOLOSTAR) 100 UNIT/ML Solostar Pen [[id number]]   2) Pharmacy of Choice: [facility] [address] [place] - [address]  [address] [place] [address]     Approved medications will be sent to pharmacy, we will reach out to you if there is an issue.  Requests made after 3pm may not be addressed until following business day!

## 2019-04-09 NOTE — Telephone Encounter (Signed)
May you take care of this

## 2019-04-17 ENCOUNTER — Telehealth: Payer: Self-pay | Admitting: Internal Medicine

## 2019-04-17 DIAGNOSIS — IMO0002 Reserved for concepts with insufficient information to code with codable children: Secondary | ICD-10-CM

## 2019-04-17 DIAGNOSIS — E1129 Type 2 diabetes mellitus with other diabetic kidney complication: Secondary | ICD-10-CM

## 2019-04-17 DIAGNOSIS — E119 Type 2 diabetes mellitus without complications: Secondary | ICD-10-CM

## 2019-04-17 NOTE — Telephone Encounter (Signed)
1) Medication(s) Requested (by name): metFORMIN (GLUCOPHAGE) 1000 MG tablet Insulin Glargine (LANTUS SOLOSTAR) 100 UNIT/ML Solostar Pen   2) [facility] [address]   3) Special Requests: Patient states she does not have any more refills    Approved medications will be sent to the pharmacy, we will reach out if there is an issue.  Requests made after 3pm may not be addressed until the following business day!  If a patient is unsure of the name of the medication(s) please note and ask patient to call back when they are able to provide all info, do not send to responsible party until all information is available!

## 2019-04-18 MED ORDER — METFORMIN HCL 1000 MG PO TABS: 500.0000 mg | ORAL_TABLET | Freq: Two times a day (BID) | ORAL | 0 refills | Status: DC

## 2019-04-18 MED ORDER — LANTUS SOLOSTAR 100 UNIT/ML ~~LOC~~ SOPN: 20.0000 [IU] | PEN_INJECTOR | Freq: Every day | SUBCUTANEOUS | 0 refills | Status: DC

## 2019-04-18 NOTE — Telephone Encounter (Signed)
Refills sent

## 2019-05-17 ENCOUNTER — Telehealth: Payer: Self-pay | Admitting: Internal Medicine

## 2019-05-17 NOTE — Telephone Encounter (Signed)
Patient called and stated that she is having trouble paying for listed medications. Patient stated she was told that there is a program that could help her reduce the cost if so she would like more information about it. Please follow up at your earliest convenience.   Insulin Lantus Metformin

## 2019-05-17 NOTE — Telephone Encounter (Signed)
Call returned. Pt is aware that she will need to touch base with Tresa Endo in our pharmacy tomorrow.

## 2019-05-18 ENCOUNTER — Encounter: Payer: Self-pay | Admitting: Family

## 2019-05-18 ENCOUNTER — Other Ambulatory Visit: Payer: Self-pay

## 2019-05-18 ENCOUNTER — Ambulatory Visit: Payer: Self-pay | Attending: Internal Medicine | Admitting: Family

## 2019-05-18 VITALS — BP 135/91 | HR 81 | Temp 97.2°F | Resp 16 | Wt 281.2 lb

## 2019-05-18 DIAGNOSIS — E1165 Type 2 diabetes mellitus with hyperglycemia: Secondary | ICD-10-CM

## 2019-05-18 DIAGNOSIS — I10 Essential (primary) hypertension: Secondary | ICD-10-CM

## 2019-05-18 DIAGNOSIS — B3731 Acute candidiasis of vulva and vagina: Secondary | ICD-10-CM

## 2019-05-18 DIAGNOSIS — B9689 Other specified bacterial agents as the cause of diseases classified elsewhere: Secondary | ICD-10-CM

## 2019-05-18 DIAGNOSIS — R809 Proteinuria, unspecified: Secondary | ICD-10-CM

## 2019-05-18 DIAGNOSIS — N760 Acute vaginitis: Secondary | ICD-10-CM

## 2019-05-18 DIAGNOSIS — B373 Candidiasis of vulva and vagina: Secondary | ICD-10-CM

## 2019-05-18 DIAGNOSIS — E1129 Type 2 diabetes mellitus with other diabetic kidney complication: Secondary | ICD-10-CM

## 2019-05-18 DIAGNOSIS — IMO0002 Reserved for concepts with insufficient information to code with codable children: Secondary | ICD-10-CM

## 2019-05-18 LAB — POCT GLYCOSYLATED HEMOGLOBIN (HGB A1C): HbA1c, POC (controlled diabetic range): 10.7 % — AB (ref 0.0–7.0)

## 2019-05-18 LAB — GLUCOSE, POCT (MANUAL RESULT ENTRY): POC Glucose: 298 mg/dl — AB (ref 70–99)

## 2019-05-18 MED ORDER — BD PEN NEEDLE MINI U/F 31G X 5 MM MISC: 3 refills | Status: DC

## 2019-05-18 MED ORDER — TRUEPLUS LANCETS 28G MISC: 4 refills | Status: DC

## 2019-05-18 MED ORDER — METFORMIN HCL 1000 MG PO TABS: 500.0000 mg | ORAL_TABLET | Freq: Two times a day (BID) | ORAL | 0 refills | Status: DC

## 2019-05-18 MED ORDER — LANTUS SOLOSTAR 100 UNIT/ML ~~LOC~~ SOPN: 20.0000 [IU] | PEN_INJECTOR | Freq: Every day | SUBCUTANEOUS | 0 refills | Status: DC

## 2019-05-18 MED ORDER — METFORMIN HCL 1000 MG PO TABS: 500.0000 mg | ORAL_TABLET | Freq: Two times a day (BID) | ORAL | 2 refills | Status: DC

## 2019-05-18 MED ORDER — LISINOPRIL 5 MG PO TABS: 5.0000 mg | ORAL_TABLET | Freq: Every day | ORAL | 3 refills | Status: DC

## 2019-05-18 MED ORDER — TRUE METRIX BLOOD GLUCOSE TEST VI STRP: ORAL_STRIP | 12 refills | Status: DC

## 2019-05-18 MED ORDER — TRULICITY 0.75 MG/0.5ML ~~LOC~~ SOAJ: 0.7500 mg | SUBCUTANEOUS | 6 refills | Status: DC

## 2019-05-18 MED FILL — TRUEplus LANCETS 28G MISC: 30 days supply | Qty: 100 | Fill #0

## 2019-05-18 MED FILL — LANTUS SOLOSTAR 100 UNITS/M: 100 | 30 days supply | Qty: 6 | Fill #0

## 2019-05-18 MED FILL — TRUEPLUS 5-BEVEL PEN NEEDLE: 31G X 5 MM | 30 days supply | Qty: 100 | Fill #0

## 2019-05-18 MED FILL — metFORMIN HCL 1000 MG TABS: 1000 | 30 days supply | Qty: 30 | Fill #0

## 2019-05-18 MED FILL — LISINOPRIL 5 MG TABLET: 5 | 30 days supply | Qty: 30 | Fill #0

## 2019-05-18 MED FILL — TRUE METRIX TEST STRIP: 25 days supply | Qty: 100 | Fill #0

## 2019-05-18 MED FILL — TRULICITY 0.75 MG/0.5 ML PE: 0.75 | 28 days supply | Qty: 2 | Fill #0

## 2019-05-18 NOTE — Patient Instructions (Addendum)
Follow-up with attending physician in 3 months. Diabetes Basics  Diabetes (diabetes mellitus) is a long-term (chronic) disease. It occurs when the body does not properly use sugar (glucose) that is released from food after you eat. Diabetes may be caused by one or both of these problems:  Your pancreas does not make enough of a hormone called insulin.  Your body does not react in a normal way to insulin that it makes. Insulin lets sugars (glucose) go into cells in your body. This gives you energy. If you have diabetes, sugars cannot get into cells. This causes high blood sugar (hyperglycemia). Follow these instructions at home: How is diabetes treated? You may need to take insulin or other diabetes medicines daily to keep your blood sugar in balance. Take your diabetes medicines every day as told by your doctor. List your diabetes medicines here: Diabetes medicines  Name of medicine: ______________________________ ? Amount (dose): _______________ Time (a.m./p.m.): _______________ Notes: ___________________________________  Name of medicine: ______________________________ ? Amount (dose): _______________ Time (a.m./p.m.): _______________ Notes: ___________________________________  Name of medicine: ______________________________ ? Amount (dose): _______________ Time (a.m./p.m.): _______________ Notes: ___________________________________ If you use insulin, you will learn how to give yourself insulin by injection. You may need to adjust the amount based on the food that you eat. List the types of insulin you use here: Insulin  Insulin type: ______________________________ ? Amount (dose): _______________ Time (a.m./p.m.): _______________ Notes: ___________________________________  Insulin type: ______________________________ ? Amount (dose): _______________ Time (a.m./p.m.): _______________ Notes: ___________________________________  Insulin type: ______________________________ ? Amount  (dose): _______________ Time (a.m./p.m.): _______________ Notes: ___________________________________  Insulin type: ______________________________ ? Amount (dose): _______________ Time (a.m./p.m.): _______________ Notes: ___________________________________  Insulin type: ______________________________ ? Amount (dose): _______________ Time (a.m./p.m.): _______________ Notes: ___________________________________ How do I manage my blood sugar?  Check your blood sugar levels using a blood glucose monitor as directed by your doctor. Your doctor will set treatment goals for you. Generally, you should have these blood sugar levels:  Before meals (preprandial): 80-130 mg/dL (4.4-7.2 mmol/L).  After meals (postprandial): below 180 mg/dL (10 mmol/L).  A1c level: less than 7%. Write down the times that you will check your blood sugar levels: Blood sugar checks  Time: _______________ Notes: ___________________________________  Time: _______________ Notes: ___________________________________  Time: _______________ Notes: ___________________________________  Time: _______________ Notes: ___________________________________  Time: _______________ Notes: ___________________________________  Time: _______________ Notes: ___________________________________  What do I need to know about low blood sugar? Low blood sugar is called hypoglycemia. This is when blood sugar is at or below 70 mg/dL (3.9 mmol/L). Symptoms may include:  Feeling: ? Hungry. ? Worried or nervous (anxious). ? Sweaty and clammy. ? Confused. ? Dizzy. ? Sleepy. ? Sick to your stomach (nauseous).  Having: ? A fast heartbeat. ? A headache. ? A change in your vision. ? Tingling or no feeling (numbness) around the mouth, lips, or tongue. ? Jerky movements that you cannot control (seizure).  Having trouble with: ? Moving (coordination). ? Sleeping. ? Passing out (fainting). ? Getting upset easily  (irritability). Treating low blood sugar To treat low blood sugar, eat or drink something sugary right away. If you can think clearly and swallow safely, follow the 15:15 rule:  Take 15 grams of a fast-acting carb (carbohydrate). Talk with your doctor about how much you should take.  Some fast-acting carbs are: ? Sugar tablets (glucose pills). Take 3-4 glucose pills. ? 6-8 pieces of hard candy. ? 4-6 oz (120-150 mL) of fruit juice. ? 4-6 oz (120-150 mL) of regular (not diet) soda. ? 1  Tbsp (15 mL) honey or sugar.  Check your blood sugar 15 minutes after you take the carb.  If your blood sugar is still at or below 70 mg/dL (3.9 mmol/L), take 15 grams of a carb again.  If your blood sugar does not go above 70 mg/dL (3.9 mmol/L) after 3 tries, get help right away.  After your blood sugar goes back to normal, eat a meal or a snack within 1 hour. Treating very low blood sugar If your blood sugar is at or below 54 mg/dL (3 mmol/L), you have very low blood sugar (severe hypoglycemia). This is an emergency. Do not wait to see if the symptoms will go away. Get medical help right away. Call your local emergency services (911 in the [place] Do not drive yourself to the hospital. Questions to ask your health care provider  Do I need to meet with a diabetes educator?  What equipment will I need to care for myself at home?  What diabetes medicines do I need? When should I take them?  How often do I need to check my blood sugar?  What number can I call if I have questions?  When is my next doctor's visit?  Where can I find a support group for people with diabetes? Where to find more information  American Diabetes Association: www.diabetes.org  American Association of Diabetes Educators: www.diabeteseducator.org/patient-resources Contact a doctor if:  Your blood sugar is at or above 240 mg/dL (16.1 mmol/L) for 2 days in a row.  You have been sick or have had a fever for 2 days or more,  and you are not getting better.  You have any of these problems for more than 6 hours: ? You cannot eat or drink. ? You feel sick to your stomach (nauseous). ? You throw up (vomit). ? You have watery poop (diarrhea). Get help right away if:  Your blood sugar is lower than 54 mg/dL (3 mmol/L).  You get confused.  You have trouble: ? Thinking clearly. ? Breathing. Summary  Diabetes (diabetes mellitus) is a long-term (chronic) disease. It occurs when the body does not properly use sugar (glucose) that is released from food after digestion.  Take insulin and diabetes medicines as told.  Check your blood sugar every day, as often as told.  Keep all follow-up visits as told by your doctor. This is important. This information is not intended to replace advice given to you by your health care provider. Make sure you discuss any questions you have with your health care provider. Document Revised: 11/22/2018 Document Reviewed: 06/03/2017 Elsevier Patient Education  2020 [employer].

## 2019-05-18 NOTE — Progress Notes (Signed)
Counseled patient on importance of medication adherence, low-sodium/low-salt diet, and at least 150 minutes of exercise weekly. Follow-up with attending physician in 3 months. Also, encouraged patient to apply for [place] discount/orange card/blue card for financial assistance.

## 2019-05-18 NOTE — Progress Notes (Signed)
Patient ID: Laura Ashley, female    DOB: [birth year]  MRN: [id number]  CC: Diabetes  Subjective: Laura Ashley is a 29 y.o. female with history of hypertension, uncontrolled type 2 diabetes mellitus with microalbuminuria, dysfunctional uterine bleeding, morbid obesity, and PCOS who presents for diabetes follow-up.  1. DIABETES TYPE 2 FOLLOW-UP: Last A1C: 10.7, March 2021 Results for orders placed or performed in visit on 05/18/19  POCT glucose (manual entry)  Result Value Ref Range   POC Glucose 298 (A) 70 - 99 mg/dl    Med Adherence:  _0  Yes    _1  No Adheres to Metformin and Trulicity. Has not taken Lantus for 1 month because she ran out of refills. Medication side effects:  _2  Yes, Metformin causing frequent bowel movements. No side effects with insulin. Home Monitoring?  _3  Yes    _4  No Home glucose results range: mid 200s and lower Diet Adherence: _5  Yes    _6  No     Exercise: _7  Yes    _8  No  Hypoglycemic episodes?: _9  Yes    _10  No Numbness of the feet? _11  Yes    _12  No Retinopathy hx? _13  Yes    _14  No Last eye exam: Denies Comments: Fasting this morning. About 1.5 weeks ago vaginal discharge began, initially was thick and white but eventually turn thin white, has a odor but she cant describe, admits itching, initially had burning sensation but no longer has it, took boric acid and reports it helped.  Reports that she typically gets a yeast infection when she quits taking insulin injections.  Last visit October 2020 with Dr. Wynetta Emery during that encounter blood sugars were not at goal, Trulicity added, continue Lantus and Metformin, and labs drawn.  2. HYPERTENSION FOLLOW-UP:  Currently taking: see medication list Med Adherence: _15  Yes    _16  No Medication side effects: _17  Yes    _18  No Adherence with salt restriction: _19  Yes    _20  No Exercise: Yes _21  No _22  Home Monitoring?: _23  Yes    _24  No Monitoring Frequency: _25  Yes    _26  No Home BP results range: _27  Yes     _28  No SOB? _29  Yes    _30  No Chest Pain?: _31  Yes    _32  No Leg swelling?: _33  Yes    _34  No Headaches?: _35  Yes , sometimes and Aleve helps Dizziness? _36  Yes    _37  No Comments: Last visit October 2020 with Dr. Wynetta Emery during that encounter blood pressure was not at goal, DASH diet encouraged, and lisinopril low-dose started.  Patient Active Problem List   Diagnosis Date Noted  . Uncontrolled type 2 diabetes mellitus with microalbuminuria ([place]) 10/27/2018  . Elevated blood pressure reading 09/25/2018  . Class 3 severe obesity due to excess calories with serious comorbidity and body mass index (BMI) of 45.0 to 49.9 in adult ([place]) 09/25/2018  . History of PCOS 09/25/2018  . Morbidly obese ([place]) 02/28/2014  . Hypertension 02/28/2014  . Dysfunctional uterine bleeding 02/28/2014     Current Outpatient Medications on File Prior to Visit  Medication Sig Dispense Refill  . Blood Glucose Monitoring Suppl (TRUE METRIX METER) w/Device KIT Use as directed 1 kit 0  . Dulaglutide (TRULICITY) 2.44 WN/0.2VO SOPN Inject 0.75 mg into the skin once a week. 4 pen 6  . famotidine (PEPCID) 20 MG tablet Take 1 tablet (20 mg total) by mouth 2 (two) times daily for 14 days. 28 tablet 0  . fluconazole (DIFLUCAN) 150 MG tablet  Take 1 tablet (150 mg total) by mouth daily. (Patient not taking: Reported on 05/18/2019) 2 tablet 0  . glucose blood (TRUE METRIX BLOOD GLUCOSE TEST) test strip Use as instructed 100 each 12  . Insulin Glargine (LANTUS SOLOSTAR) 100 UNIT/ML Solostar Pen Inject 20 Units into the skin daily. Must have office visit for refills 5 pen 0  . Insulin Pen Needle (B-D UF III MINI PEN NEEDLES) 31G X 5 MM MISC Use as directed 100 each 3  . levonorgestrel-ethinyl estradiol (ALESSE) 0.1-20 MG-MCG tablet Take 1 tablet by mouth daily. 1 Package 2  . lisinopril (ZESTRIL) 5 MG tablet Take 1 tablet (5 mg total) by mouth daily. (Patient not taking: Reported on 01/31/2019) 90 tablet 3  . metFORMIN (GLUCOPHAGE)  1000 MG tablet Take 0.5 tablets (500 mg total) by mouth 2 (two) times daily. Must have office visit for refills 30 tablet 0  . metroNIDAZOLE (FLAGYL) 500 MG tablet Take 1 tablet (500 mg total) by mouth 2 (two) times daily. (Patient not taking: Reported on 05/18/2019) 14 tablet 0  . TRUEplus Lancets 28G MISC Use as directed 100 each 4   No current facility-administered medications on file prior to visit.    Allergies  Allergen Reactions  . Penicillins Rash    Has patient had a PCN reaction causing immediate rash, facial/tongue/throat swelling, SOB or lightheadedness with hypotension: Yes Has patient had a PCN reaction causing severe rash involving mucus membranes or skin necrosis: Yes Has patient had a PCN reaction that required hospitalization: No Has patient had a PCN reaction occurring within the last 10 years: No If all of the above answers are "NO", then may proceed with Cephalosporin use.    Social History   Socioeconomic History  . Marital status: Single    Spouse name: Not on file  . Number of children: 0  . Years of education: Not on file  . Highest education level: Associate degree: academic program  Occupational History  . Not on file  Tobacco Use  . Smoking status: Former Research scientist (life sciences)  . Smokeless tobacco: Never Used  Substance and Sexual Activity  . Alcohol use: Yes  . Drug use: No  . Sexual activity: Yes  Other Topics Concern  . Not on file  Social History Narrative  . Not on file   Social Determinants of Health   Financial Resource Strain:   . Difficulty of Paying Living Expenses: Not on file  Food Insecurity:   . Worried About Charity fundraiser in the Last Year: Not on file  . Ran Out of Food in the Last Year: Not on file  Transportation Needs:   . Lack of Transportation (Medical): Not on file  . Lack of Transportation (Non-Medical): Not on file  Physical Activity:   . Days of Exercise per Week: Not on file  . Minutes of Exercise per Session: Not on file   Stress:   . Feeling of Stress : Not on file  Social Connections:   . Frequency of Communication with Friends and Family: Not on file  . Frequency of Social Gatherings with Friends and Family: Not on file  . Attends Religious Services: Not on file  . Active Member of Clubs or Organizations: Not on file  . Attends Archivist Meetings: Not on file  . Marital Status: Not on file  Intimate Partner Violence:   . Fear of Current or Ex-Partner: Not on file  . Emotionally Abused: Not on file  . Physically Abused: Not  on file  . Sexually Abused: Not on file    Family History  Problem Relation Age of Onset  . Diabetes Father   . Diabetes Other   . Healthy Mother     Past Surgical History:  Procedure Laterality Date  . NO PAST SURGERIES      ROS: Negative except as stated above  PHYSICAL EXAM: Vitals with BMI 05/18/2019 03/14/2019 01/31/2019  Height - - -  Weight 281 lbs 3 oz - 284 lbs 2 oz  BMI - - -  Systolic 093 267 124  Diastolic 91 94 56  Pulse 81 89 87   Physical Exam General appearance - alert, well appearing, and in no distress, oriented to person, place, and time and overweight Mental status - alert, oriented to person, place, and time, normal mood, behavior, speech, dress, motor activity, and thought processes Eyes - pupils equal and reactive, extraocular eye movements intact, funduscopic exam normal, discs flat and sharp Chest - clear to auscultation, no wheezes, rales or rhonchi, symmetric air entry, no tachypnea, retractions or cyanosis Heart - normal rate, regular rhythm, normal S1, S2, no murmurs, rubs, clicks or gallops Neurological - alert, oriented, normal speech, no focal findings or movement disorder noted, neck supple without rigidity, cranial nerves II through XII intact, funduscopic exam normal, discs flat and sharp, DTR's normal and symmetric, motor and sensory grossly normal bilaterally, normal muscle tone, no tremors, strength 5/5   CMP Latest  Ref Rng & Units 09/08/2018 02/28/2014 11/16/2012  Glucose 70 - 99 mg/dL 362(H) 94 100(H)  BUN 6 - 20 mg/dL _0 Creatinine 0.44 - 1.00 mg/dL 0.68 0.86 0.70  Sodium 135 - 145 mmol/L 132(L) 138 141  Potassium 3.5 - 5.1 mmol/L 4.0 4.0 3.9  Chloride 98 - 111 mmol/L 97(L) 103 105  CO2 22 - 32 mmol/L _1 Calcium 8.9 - 10.3 mg/dL 9.5 9.7 9.6  Total Protein 6.0 - 8.3 g/dL - - 7.5  Total Bilirubin 0.3 - 1.2 mg/dL - - 0.3  Alkaline Phos 39 - 117 U/L - - 64  AST 0 - 37 U/L - - 21  ALT 0 - 35 U/L - - 20   Lipid Panel  No results found for: CHOL, TRIG, HDL, CHOLHDL, VLDL, LDLCALC, LDLDIRECT  CBC    Component Value Date/Time   WBC 4.5 12/21/2018 1120   WBC 5.5 09/08/2018 1328   RBC 4.78 12/21/2018 1120   RBC 5.00 09/08/2018 1328   HGB 14.4 12/21/2018 1120   HCT 43.0 12/21/2018 1120   PLT 358 12/21/2018 1120   MCV 90 12/21/2018 1120   MCH 30.1 12/21/2018 1120   MCH 30.8 09/08/2018 1328   MCHC 33.5 12/21/2018 1120   MCHC 35.4 09/08/2018 1328   RDW 12.4 12/21/2018 1120   LYMPHSABS 2.4 09/08/2018 1328   MONOABS 0.6 09/08/2018 1328   EOSABS 0.1 09/08/2018 1328   BASOSABS 0.0 09/08/2018 1328   Results for orders placed or performed in visit on 05/18/19  POCT glucose (manual entry)  Result Value Ref Range   POC Glucose 298 (A) 70 - 99 mg/dl  POCT glycosylated hemoglobin (Hb A1C)  Result Value Ref Range   Hemoglobin A1C     HbA1c POC (<> result, manual entry)     HbA1c, POC (prediabetic range)     HbA1c, POC (controlled diabetic range) 10.7 (A) 0.0 - 7.0 %   ASSESSMENT AND PLAN: 1. Uncontrolled type 2 diabetes mellitus with microalbuminuria ([place]): - POCT glucose (  manual entry) - POCT glycosylated hemoglobin (Hb A1C) - Microalbumin / creatinine urine ratio - Cervicovaginal ancillary only - insulin glargine (LANTUS SOLOSTAR) 100 UNIT/ML Solostar Pen; Inject 20 Units into the skin daily. Must have office visit for refills  Dispense: 5 pen; Refill: 0 - Dulaglutide (TRULICITY)  4.27 CW/2.3JS SOPN; Inject 0.75 mg into the skin once a week.  Dispense: 4 pen; Refill: 6 - metFORMIN (GLUCOPHAGE) 1000 MG tablet; Take 0.5 tablets (500 mg total) by mouth 2 (two) times daily. Must have office visit for refills  Dispense: 30 tablet; Refill: 2 - TRUEplus Lancets 28G MISC; Use as directed  Dispense: 100 each; Refill: 4 - glucose blood (TRUE METRIX BLOOD GLUCOSE TEST) test strip; Use as instructed  Dispense: 100 each; Refill: 12 - Insulin Pen Needle (B-D UF III MINI PEN NEEDLES) 31G X 5 MM MISC; Use as directed  Dispense: 100 each; Refill: 3 -Blood sugars still not at goal.  Has not taken Lantus for 1 month which may have increased risk of uncontrolled blood sugars and elevated A1c. -Discussed the importance of healthy eating habits, low-carbohydrate diet, low-sugar diet, regular aerobic exercise (at least 150 minutes a week as tolerated) and medication compliance to achieve or maintain control of diabetes. -To achieve an A1C goal of less than or equal to 7.0 percent, a fasting blood sugar of 80 to 130 mg/dL and a postprandial glucose (90 to 120 minutes after a meal) less than 180 mg/dL. In the event of sugars less than 60 mg/dl or greater than 400 mg/dl please notify the clinic ASAP. It is recommended that you undergo annual eye exams and annual foot exams. -Follow-up with attending physician in 3 months. -Referred patient to apply for [facility] discount/orange card for financial assistance.  2. Essential hypertension:  - lisinopril (ZESTRIL) 5 MG tablet; Take 1 tablet (5 mg total) by mouth daily.  Dispense: 90 tablet; Refill: 3 -Continue lisinopril as prescribed. -Counseled on blood pressure goal of less than 130/80, low-sodium, DASH diet, medication compliance, 150 minutes of moderate intensity exercise per week. Discussed medication compliance, adverse effects.   Patient was given the opportunity to ask questions. Patient verbalized understanding of the plan and was able to  repeat key elements of the plan. Patient was given clear instructions to go to Emergency Department or return to medical center if symptoms don't improve, worsen, or new problems develop.The patient verbalized understanding.   Orders Placed This Encounter  Procedures  . Microalbumin / creatinine urine ratio  . POCT glucose (manual entry)  . POCT glycosylated hemoglobin (Hb A1C)     Requested Prescriptions    No prescriptions requested or ordered in this encounter    Nyriah Coote Zachery Dauer, NP

## 2019-05-19 LAB — MICROALBUMIN / CREATININE URINE RATIO
Creatinine, Urine: 175.6 mg/dL
Microalb/Creat Ratio: 42 mg/g creat — ABNORMAL HIGH (ref 0–29)
Microalbumin, Urine: 73.2 ug/mL

## 2019-05-21 ENCOUNTER — Telehealth: Payer: Self-pay

## 2019-05-21 LAB — CERVICOVAGINAL ANCILLARY ONLY
Bacterial Vaginitis (gardnerella): POSITIVE — AB
Candida Glabrata: NEGATIVE
Candida Vaginitis: POSITIVE — AB
Chlamydia: NEGATIVE
Comment: NEGATIVE
Comment: NEGATIVE
Comment: NEGATIVE
Comment: NEGATIVE
Comment: NEGATIVE
Comment: NORMAL
Neisseria Gonorrhea: NEGATIVE
Trichomonas: NEGATIVE

## 2019-05-21 MED ORDER — METRONIDAZOLE 500 MG PO TABS: 500.0000 mg | ORAL_TABLET | Freq: Two times a day (BID) | ORAL | 0 refills | Status: DC

## 2019-05-21 MED ORDER — FLUCONAZOLE 150 MG PO TABS: 150.0000 mg | ORAL_TABLET | Freq: Once | ORAL | 0 refills | Status: AC

## 2019-05-21 NOTE — Addendum Note (Signed)
Addended by: Rema Fendt on: 05/21/2019 08:44 PM   Modules accepted: Orders

## 2019-05-21 NOTE — Progress Notes (Signed)
Please call patient with update cervicovaginal ancillary positive for bacterial vaginosis and candida vaginitis.   For bacterial vaginosis take Metronidazole 500 mg (1 tablet) orally twice daily for 7 days.  For candida vaginitis take Fluconazole 150 mg (1 tablet) orally in a single dose.   Medications have been sent to patient's pharmacy on file.

## 2019-05-21 NOTE — Progress Notes (Signed)
Please call patient with update. Microalbumin/creatinine ratio elevated but much improved since last visit 7 months ago. Continue diabetic medications, checking blood sugars at least two times daily, and exercise at least 150 mins/weekly to help improve numbers. Follow-up with attending physician in 3 months. Waiting on results of cervicovaginal ancillary swab.

## 2019-05-21 NOTE — Telephone Encounter (Signed)
Contacted pt to go over lab results pt is aware and doesn't have any questions or concerns 

## 2019-05-22 ENCOUNTER — Telehealth: Payer: Self-pay

## 2019-05-22 MED ORDER — FLUCONAZOLE 150 MG PO TABS: 150.0000 mg | ORAL_TABLET | Freq: Once | ORAL | 0 refills | Status: AC

## 2019-05-22 MED FILL — FLUCONAZOLE 150 MG TABLET: 150 | 1 days supply | Qty: 1 | Fill #0

## 2019-05-22 MED FILL — metroNIDAZOLE 500 MG TABS: 500 | 7 days supply | Qty: 14 | Fill #0

## 2019-05-22 NOTE — Telephone Encounter (Signed)
Contacted pt to go over swab results pt is aware and doesn't have any questions or concerns  

## 2019-05-22 NOTE — Addendum Note (Signed)
Addended by: Rema Fendt on: 05/22/2019 11:18 AM   Modules accepted: Orders

## 2019-06-28 MED FILL — LANTUS SOLOSTAR 100 UNITS/M: 100 | 30 days supply | Qty: 6 | Fill #1

## 2019-08-06 ENCOUNTER — Other Ambulatory Visit: Payer: Self-pay | Admitting: Internal Medicine

## 2019-08-06 MED FILL — metFORMIN HCL 1000 MG TABS: 1000 | 30 days supply | Qty: 30 | Fill #0

## 2019-08-06 MED FILL — LEVONOR-ETH ESTRAD 0.1-0.02: 0.1-20 | 28 days supply | Qty: 28 | Fill #0

## 2019-09-03 ENCOUNTER — Ambulatory Visit: Payer: Self-pay | Attending: Internal Medicine | Admitting: Internal Medicine

## 2019-09-03 ENCOUNTER — Other Ambulatory Visit: Payer: Self-pay | Admitting: Internal Medicine

## 2019-09-03 ENCOUNTER — Other Ambulatory Visit: Payer: Self-pay

## 2019-09-03 DIAGNOSIS — R809 Proteinuria, unspecified: Secondary | ICD-10-CM

## 2019-09-03 DIAGNOSIS — E1165 Type 2 diabetes mellitus with hyperglycemia: Secondary | ICD-10-CM

## 2019-09-03 DIAGNOSIS — I10 Essential (primary) hypertension: Secondary | ICD-10-CM

## 2019-09-03 DIAGNOSIS — IMO0002 Reserved for concepts with insufficient information to code with codable children: Secondary | ICD-10-CM

## 2019-09-03 DIAGNOSIS — Z6841 Body Mass Index (BMI) 40.0 and over, adult: Secondary | ICD-10-CM

## 2019-09-03 DIAGNOSIS — Z7189 Other specified counseling: Secondary | ICD-10-CM

## 2019-09-03 DIAGNOSIS — E1129 Type 2 diabetes mellitus with other diabetic kidney complication: Secondary | ICD-10-CM

## 2019-09-03 DIAGNOSIS — E282 Polycystic ovarian syndrome: Secondary | ICD-10-CM

## 2019-09-03 MED ORDER — LEVONORGESTREL-ETHINYL ESTRAD 0.1-20 MG-MCG PO TABS: 1.0000 | ORAL_TABLET | Freq: Every day | ORAL | 5 refills | Status: DC

## 2019-09-03 MED ORDER — METFORMIN HCL 1000 MG PO TABS: 500.0000 mg | ORAL_TABLET | Freq: Every day | ORAL | 4 refills | Status: DC

## 2019-09-03 MED ORDER — LANTUS SOLOSTAR 100 UNIT/ML ~~LOC~~ SOPN: 26.0000 [IU] | PEN_INJECTOR | Freq: Every day | SUBCUTANEOUS | 6 refills | Status: DC

## 2019-09-03 MED FILL — !LANTUS SOLOSTAR 100UNITS/M: 100 | 33 days supply | Qty: 9 | Fill #0

## 2019-09-03 MED FILL — metFORMIN HCL 1000 MG TABS: 1000 | 30 days supply | Qty: 15 | Fill #0

## 2019-09-03 MED FILL — LEVONOR-ETH ESTRAD 0.1-0.02: 0.1-20 | 28 days supply | Qty: 28 | Fill #0

## 2019-09-03 NOTE — Progress Notes (Signed)
Virtual Visit via Telephone Note Due to current restrictions/limitations of in-office visits due to the COVID-19 pandemic, this scheduled clinical appointment was converted to a telehealth visit  I connected with Laura Ashley on 09/03/19 at 8:38 a.m by telephone and verified that I am speaking with the correct person using two identifiers. I am in my office.  The patient is at home.  Only the patient and myself participated in this encounter.  I discussed the limitations, risks, security and privacy concerns of performing an evaluation and management service by telephone and the availability of in person appointments. I also discussed with the patient that there may be a patient responsible charge related to this service. The patient expressed understanding and agreed to proceed.   History of Present Illness: Patient with history of DM type II microalbumin, HTN, obesity, microalbuminuria, PCOS. Today's visit is for chronic ds management.  DIABETES TYPE 2/Obesity Last A1C:   Lab Results  Component Value Date   HGBA1C 10.7 (A) 05/18/2019   Med Adherence:  []  Yes    [x]  No - on last visit, she had been out of the Trulicity.  It was refill but pt did not get the med fill.  Has to bring in copy of pay stubs to  Medication side effects:  [x]  Yes -more consistent diarrhea with Metformin Home Monitoring?  [x]  Yes  BID before BF and at bedtime Home glucose results range: before BF BS in upper 100-low 200s; bedtime mid 200s Diet Adherence: eating smaller portions and trying to avoid things that elev BS   Exercise: [x]  Yes -walks around [place] for 25 mins 3-4 x a wk Hypoglycemic episodes?: []  Yes    [x]  No Numbness of the feet? []  Yes    [x]  No Retinopathy hx? []  Yes    []  No Last eye exam:  Over due.  Plans to schedule Comments:  Thinks she has loss some wgh but has not wgh self  HYPERTENSION Currently taking: see medication list Med Adherence: [x]  Yes    []  No Medication side  effects: []  Yes    [x]  No Adherence with salt restriction: [x]  Yes    []  No Home Monitoring?: []  Yes    [x]  No Monitoring Frequency: []  Yes    []  No Home BP results range: []  Yes    []  No SOB? []  Yes    [x]  No Chest Pain?: []  Yes    [x]  No Leg swelling?: []  Yes    [x]  No Headaches?: [x]  Yes - occasional   Dizziness? []  Yes    [x]  No Comments:   PCOS: had menses 1 wk ago and still having flow. Very heavy, bled through tampon and pap yesterday. On Alesse but only took it x 2 mths. After 1st pk, menstrual flow was normal but heavy after second pk -Saw GYN Dr. Hulan Fray 11/20 for DUB.  She agreed with and recommended that patient remain on oral birth control to regulate cycles.  Normal pelvic ultrasound in 2018.  HM: scared to get COVID vaccine. Never had flu shot either.  Afraid of her her body may react to the vaccine but states she  Outpatient Encounter Medications as of 09/03/2019  Medication Sig  . Blood Glucose Monitoring Suppl (TRUE METRIX METER) w/Device KIT Use as directed  . Dulaglutide (TRULICITY) 1.44 RX/5.4MG SOPN Inject 0.75 mg into the skin once a week.  . famotidine (PEPCID) 20 MG tablet Take 1 tablet (20 mg total) by mouth 2 (two) times  daily for 14 days.  Marland Kitchen glucose blood (TRUE METRIX BLOOD GLUCOSE TEST) test strip Use as instructed  . insulin glargine (LANTUS SOLOSTAR) 100 UNIT/ML Solostar Pen Inject 20 Units into the skin daily. Must have office visit for refills  . Insulin Pen Needle (B-D UF III MINI PEN NEEDLES) 31G X 5 MM MISC Use as directed  . levonorgestrel-ethinyl estradiol (ALESSE) 0.1-20 MG-MCG tablet TAKE 1 TABLET BY MOUTH DAILY.  Marland Kitchen lisinopril (ZESTRIL) 5 MG tablet Take 1 tablet (5 mg total) by mouth daily.  . metFORMIN (GLUCOPHAGE) 1000 MG tablet Take 0.5 tablets (500 mg total) by mouth 2 (two) times daily. Must have office visit for refills  . metroNIDAZOLE (FLAGYL) 500 MG tablet Take 1 tablet (500 mg total) by mouth 2 (two) times daily.  . TRUEplus Lancets 28G MISC  Use as directed   No facility-administered encounter medications on file as of 09/03/2019.      Observations/Objective: Lab Results  Component Value Date   WBC 4.5 12/21/2018   HGB 14.4 12/21/2018   HCT 43.0 12/21/2018   MCV 90 12/21/2018   PLT 358 12/21/2018     Chemistry      Component Value Date/Time   NA 132 (L) 09/08/2018 1328   K 4.0 09/08/2018 1328   CL 97 (L) 09/08/2018 1328   CO2 23 09/08/2018 1328   BUN 10 09/08/2018 1328   CREATININE 0.68 09/08/2018 1328   CREATININE 0.86 02/28/2014 1701      Component Value Date/Time   CALCIUM 9.5 09/08/2018 1328   ALKPHOS 64 11/16/2012 2318   AST 21 11/16/2012 2318   ALT 20 11/16/2012 2318   BILITOT 0.3 11/16/2012 2318       Assessment and Plan: 1. Uncontrolled type 2 diabetes mellitus with microalbuminuria [facility]) Encourage patient to try to turn in hard copy of her paystub to the present in the pharmacy that does the patient assistance program if that is what it takes to complete her application to get Trulicity.  Advised that Trulicity will help with weight loss.  In the meantime increase Lantus insulin to 26 units daily.  Decrease Metformin to 500 mg once a day to see whether the diarrhea decreases.  Continue to encourage healthy eating habits and regular exercise - Lipid panel; Future - CBC; Future - Comprehensive metabolic panel; Future - Hemoglobin A1c; Future - insulin glargine (LANTUS SOLOSTAR) 100 UNIT/ML Solostar Pen; Inject 26 Units into the skin daily. Must have office visit for refills  Dispense: 5 pen; Refill: 6 - metFORMIN (GLUCOPHAGE) 1000 MG tablet; Take 0.5 tablets (500 mg total) by mouth daily with breakfast. Must have office visit for refills  Dispense: 30 tablet; Refill: 4  2. Essential hypertension Continue lisinopril  3. Class 3 severe obesity due to excess calories with serious comorbidity and body mass index (BMI) of 45.0 to 49.9 in adult [facility]) See #1 above  4. PCOS (polycystic ovarian  syndrome) Refill birth control pills - levonorgestrel-ethinyl estradiol (ALESSE) 0.1-20 MG-MCG tablet; Take 1 tablet by mouth daily.  Dispense: 28 tablet; Refill: 5  5. Educated about COVID-19 virus infection Advised patient that the COVID-19 vaccines are relatively safe and effective.  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  Told that after the first dose she can experience soreness at the injection site.  After the second dose she can have low-grade fever, headaches and body aches for which she can take Tylenol or ibuprofen.   Follow Up Instructions: 3 mths   I discussed the  assessment and treatment plan with the patient. The patient was provided an opportunity to ask questions and all were answered. The patient agreed with the plan and demonstrated an understanding of the instructions.   The patient was advised to call back or seek an in-person evaluation if the symptoms worsen or if the condition fails to improve as anticipated.  I provided 22 minutes of non-face-to-face time during this encounter.   Karle Plumber, MD

## 2019-09-25 MED FILL — LEVONOR-ETH ESTRAD 0.1-0.02: 0.1-20 | 28 days supply | Qty: 28 | Fill #1

## 2019-10-18 MED FILL — LEVONOR-ETH ESTRAD 0.1-0.02: 0.1-20 | 28 days supply | Qty: 28 | Fill #2

## 2019-10-25 MED FILL — FLUCONAZOLE 150 MG TABLET: 150 | 1 days supply | Qty: 1 | Fill #0

## 2019-11-30 ENCOUNTER — Telehealth: Payer: Self-pay | Admitting: Internal Medicine

## 2019-11-30 MED ORDER — FLUCONAZOLE 150 MG PO TABS
150.0000 mg | ORAL_TABLET | Freq: Once | ORAL | 0 refills | Status: DC
Start: 2019-11-30 — End: 2020-01-07

## 2019-11-30 NOTE — Telephone Encounter (Signed)
Will forward to Valerie  °

## 2019-11-30 NOTE — Telephone Encounter (Signed)
Patient is calling to request a medication for a yeast infection. Patient states that she was previously prescribed medication for a yeast infection. However, she does not know the name of the medication. Appt was offered. But no immediate appts available. Please advise [phone] Preferred Pharmacy- [facility].

## 2019-11-30 NOTE — Telephone Encounter (Signed)
Contacted pt and made aware that rx has been sent. Pt doesn't have any questions or concerns  

## 2019-11-30 NOTE — Telephone Encounter (Signed)
Pt previously prescribed Diflucan per MAR history, appt not available/made until 12/18/19, pls advise- can pt receive med prior to appt?

## 2019-12-03 MED FILL — FLUCONAZOLE 150 MG TABLET: 150 | 1 days supply | Qty: 1 | Fill #0

## 2019-12-13 MED FILL — LEVONOR-ETH ESTRAD 0.1-0.02: 0.1-20 | 28 days supply | Qty: 28 | Fill #3

## 2019-12-18 ENCOUNTER — Ambulatory Visit: Payer: Self-pay | Admitting: Internal Medicine

## 2019-12-27 ENCOUNTER — Ambulatory Visit: Payer: Self-pay | Admitting: Internal Medicine

## 2019-12-27 MED FILL — LEVONOR-ETH ESTRAD 0.1-0.02: 0.1-20 | 28 days supply | Qty: 28 | Fill #4

## 2019-12-27 MED FILL — TRUEPLUS PEN NDL 31GX3/16: 31G X 5 MM | 30 days supply | Qty: 100 | Fill #1

## 2020-01-03 ENCOUNTER — Other Ambulatory Visit: Payer: Self-pay | Admitting: Internal Medicine

## 2020-01-03 NOTE — Telephone Encounter (Signed)
Requested medication (s) are due for refill today - no  Requested medication (s) are on the active medication list -no  Future visit scheduled -yes  Last refill: 12/03/19  Notes to clinic: Request Rx - non assigned to protocol  Requested Prescriptions  Pending Prescriptions Disp Refills   fluconazole (DIFLUCAN) 150 MG tablet [Pharmacy Med Name: FLUCONAZOLE 150 MG TABLET 150 Tablet] 1 tablet 0    Sig: Take 1 tablet (150 mg total) by mouth once for 1 dose.      Off-Protocol Failed - 01/03/2020 12:18 PM      Failed - Medication not assigned to a protocol, review manually.      Passed - Valid encounter within last 12 months    Recent Outpatient Visits           4 months ago Uncontrolled type 2 diabetes mellitus with microalbuminuria (HCC)   [facility] Jonah Blue B, MD   7 months ago Uncontrolled type 2 diabetes mellitus with microalbuminuria [facility])   [facility] [place], [place], NP   1 year ago Encounter for medication review   [facility] Drucilla Chalet, RPH-CPP   1 year ago Uncontrolled type 2 diabetes mellitus with microalbuminuria [facility])   [facility] Marcine Matar, MD   1 year ago Uncontrolled type 2 diabetes mellitus with microalbuminuria [facility])   [place] [facility] Marcine Matar, MD       Future Appointments             In 1 month Marcine Matar, MD [facility]                Requested Prescriptions  Pending Prescriptions Disp Refills   fluconazole (DIFLUCAN) 150 MG tablet [Pharmacy Med Name: FLUCONAZOLE 150 MG TABLET 150 Tablet] 1 tablet 0    Sig: Take 1 tablet (150 mg total) by mouth once for 1 dose.      Off-Protocol Failed - 01/03/2020 12:18 PM      Failed - Medication not assigned to a protocol, review manually.      Passed - Valid encounter within last 12  months    Recent Outpatient Visits           4 months ago Uncontrolled type 2 diabetes mellitus with microalbuminuria (HCC)   [place] [facility] Jonah Blue B, MD   7 months ago Uncontrolled type 2 diabetes mellitus with microalbuminuria [facility])   [facility] [place], NP   1 year ago Encounter for medication review   [facility] Drucilla Chalet, RPH-CPP   1 year ago Uncontrolled type 2 diabetes mellitus with microalbuminuria [facility])   [facility] Marcine Matar, MD   1 year ago Uncontrolled type 2 diabetes mellitus with microalbuminuria [facility])   [place] [facility] Marcine Matar, MD       Future Appointments             In 1 month Laural Benes Binnie Rail, MD [facility]

## 2020-01-07 ENCOUNTER — Other Ambulatory Visit: Payer: Self-pay | Admitting: Internal Medicine

## 2020-01-08 MED FILL — FLUCONAZOLE 150 MG TABLET: 150 | 1 days supply | Qty: 1 | Fill #0

## 2020-01-18 MED FILL — LEVONOR-ETH ESTRAD 0.1-0.02: 0.1-20 | 28 days supply | Qty: 28 | Fill #5

## 2020-01-29 ENCOUNTER — Telehealth: Payer: Self-pay | Admitting: Internal Medicine

## 2020-01-29 DIAGNOSIS — E282 Polycystic ovarian syndrome: Secondary | ICD-10-CM

## 2020-01-29 DIAGNOSIS — N938 Other specified abnormal uterine and vaginal bleeding: Secondary | ICD-10-CM

## 2020-01-29 NOTE — Telephone Encounter (Signed)
Copied from CRM [id number]. Topic: General - Other >> Jan 29, 2020 12:12 PM Dalphine Handing A wrote: Patient stated that since she has been taking birth control that her period is still happening and is heavy. Patient would like to know what Dr. Laural Benes would advise and a callback.

## 2020-01-29 NOTE — Addendum Note (Signed)
Addended by: Jonah Blue B on: 01/29/2020 04:58 PM   Modules accepted: Orders

## 2020-01-29 NOTE — Telephone Encounter (Signed)
Contacted pt to go over Dr. Laural Benes response pt didn't answer lvm asking pt to give a call back at her earliest convenience

## 2020-01-29 NOTE — Telephone Encounter (Signed)
Will forward to pcp

## 2020-01-30 ENCOUNTER — Telehealth: Payer: Self-pay

## 2020-01-30 NOTE — Telephone Encounter (Signed)
Copied from CRM [id number]. Topic: General - Other >> Jan 30, 2020 10:24 AM Marylen Ponto wrote: Reason for CRM: Pt returned call to the office. Attempted to transfer pt to the office but was placed on a long hold. Pt requests call back.

## 2020-02-01 ENCOUNTER — Ambulatory Visit: Payer: Self-pay

## 2020-02-01 DIAGNOSIS — N938 Other specified abnormal uterine and vaginal bleeding: Secondary | ICD-10-CM

## 2020-02-01 DIAGNOSIS — E282 Polycystic ovarian syndrome: Secondary | ICD-10-CM

## 2020-02-01 NOTE — Telephone Encounter (Signed)
Patient called stating that she has vaginal bleeding that has been going on since Nov 4or 5th when she states her period started.  She takes birthcontrol for PCOS which she started as prescribed.  She states that the birth control usually stops the bleeding but has no stopped it this time.  She has severe cramping sometimes with this bleeding.  She is no pregnant. She states that the flow is sometimes heavy requiring a pad and sometimes light requiring an panty liner. She wears a tampon. She states she goes through 5-6 per day. She denies dizziness. Per office patient will go to UC for evaluation today. Patient was told to call back for follow up visit after she is seen today. She verbalized understanding and will schedule follow up visit. She is interested in possibly changing the birthcontrol pill she is taking.  She feels it is not working for her condition.  Reason for Disposition . MODERATE vaginal bleeding (e.g., soaking 1 pad or tampon per hour and present > 6 hours; 1 menstrual cup every 6 hours)  Answer Assessment - Initial Assessment Questions 1. AMOUNT: "Describe the bleeding that you are having."    - SPOTTING: spotting, or pinkish / brownish mucous discharge; does not fill panti-liner or pad    - MILD:  less than 1 pad / hour; less than patient's usual menstrual bleeding   - MODERATE: 1-2 pads / hour; 1 menstrual cup every 6 hours; small-medium blood clots (e.g., pea, grape, small coin)   - SEVERE: soaking 2 or more pads/hour for 2 or more hours; 1 menstrual cup every 2 hours; bleeding not contained by pads or continuous red blood from vagina; large blood clots (e.g., golf ball, large coin)      5-6 pads and liners 2. ONSET: "When did the bleeding begin?" "Is it continuing now?"     Started Nov 4or 5 3. MENSTRUAL PERIOD: "When was the last normal menstrual period?" "How is this different than your period?"     4-5th of nov 4. REGULARITY: "How regular are your periods?"     irregular 5.  ABDOMINAL PAIN: "Do you have any pain?" "How bad is the pain?"  (e.g., Scale 1-10; mild, moderate, or severe)   - MILD (1-3): doesn't interfere with normal activities, abdomen soft and not tender to touch    - MODERATE (4-7): interferes with normal activities or awakens from sleep, tender to touch    - SEVERE (8-10): excruciating pain, doubled over, unable to do any normal activities     No has cramps sometimes bad 6. PREGNANCY: "Could you be pregnant?" "Are you sexually active?" "Did you recently give birth?"     No  7. BREASTFEEDING: "Are you breastfeeding?"     no 8. HORMONES: "Are you taking any hormone medications, prescription or OTC?" (e.g., birth control pills, estrogen)    Birth control 9. BLOOD THINNERS: "Do you take any blood thinners?" (e.g., Coumadin/warfarin, Pradaxa/dabigatran, aspirin)    No 10. CAUSE: "What do you think is causing the bleeding?" (e.g., recent gyn surgery, recent gyn procedure; known bleeding disorder, cervical cancer, polycystic ovarian disease, fibroids)       PCOS 11. HEMODYNAMIC STATUS: "Are you weak or feeling lightheaded?" If Yes, ask: "Can you stand and walk normally?"        none 12. OTHER SYMPTOMS: "What other symptoms are you having with the bleeding?" (e.g., passed tissue, vaginal discharge, fever, menstrual-type cramps)       No  Protocols used: VAGINAL BLEEDING -  ABNORMAL-A-AH

## 2020-02-01 NOTE — Telephone Encounter (Signed)
Called pt made aware of MD instructions. Verbalized understanding 

## 2020-02-01 NOTE — Addendum Note (Signed)
Addended by: Jonah Blue B on: 02/01/2020 01:53 PM   Modules accepted: Orders

## 2020-02-01 NOTE — Telephone Encounter (Signed)
Pt called and was advised of Dr. Laural Benes advice/ Pt wasn't able to get an appt in office and was advise to go to urgent care per office/ Pt asked about getting another brand for birth control because she is still cramping and clotting/ please advise and call pt

## 2020-02-01 NOTE — Telephone Encounter (Signed)
Phone call placed to patient today.  Patient had called in reporting vaginal bleeding since the fourth of this month.  She is thinking that her birth control pills need to be changed.  She is on the Ale[place] BCP.  She has history of polycystic ovarian syndrome and dysfunctional uterine bleeding.  She was restarted on the birth control pills on her last visit with me in June.  She reports having normal cycles in July August and September each lasting about 4 days.  In October she had a cycle at the beginning of the month and then at the end of the month.  She then started bleeding again around November 4 and has continued since then.  Bleeding sometimes heavy.  However the past several days it has been light and spotting.  No dizziness.  She wants to know whether the birth control pills needs to be changed.  I told her that the best thing at this time is for her to see the gynecologist to evaluate other options.  Putting her on a higher estrogen birth control pill is not optimal given her history of hypertension and obesity.  I will hold off on prescribing megestrol given that the bleeding has slowed down and she is currently having light bleeding.  Advised patient to come to the lab to have blood test done that I had ordered back in June which includes a CBC.  Referral submitted to gynecology.  If bleeding becomes significantly heavy she will let me know.

## 2020-02-01 NOTE — Telephone Encounter (Signed)
Will forward to pcp

## 2020-02-04 ENCOUNTER — Encounter (HOSPITAL_COMMUNITY): Payer: Self-pay

## 2020-02-04 ENCOUNTER — Other Ambulatory Visit: Payer: Self-pay

## 2020-02-04 ENCOUNTER — Emergency Department (HOSPITAL_COMMUNITY)
Admission: EM | Admit: 2020-02-04 | Discharge: 2020-02-04 | Disposition: A | Discharge status: Home / Self Care | Payer: Self-pay | Attending: Emergency Medicine | Admitting: Emergency Medicine

## 2020-02-04 DIAGNOSIS — B9689 Other specified bacterial agents as the cause of diseases classified elsewhere: Secondary | ICD-10-CM | POA: Insufficient documentation

## 2020-02-04 DIAGNOSIS — Z87891 Personal history of nicotine dependence: Secondary | ICD-10-CM | POA: Insufficient documentation

## 2020-02-04 DIAGNOSIS — E1165 Type 2 diabetes mellitus with hyperglycemia: Secondary | ICD-10-CM | POA: Insufficient documentation

## 2020-02-04 DIAGNOSIS — Z794 Long term (current) use of insulin: Secondary | ICD-10-CM | POA: Insufficient documentation

## 2020-02-04 DIAGNOSIS — N939 Abnormal uterine and vaginal bleeding, unspecified: Secondary | ICD-10-CM | POA: Insufficient documentation

## 2020-02-04 DIAGNOSIS — Z7984 Long term (current) use of oral hypoglycemic drugs: Secondary | ICD-10-CM | POA: Insufficient documentation

## 2020-02-04 DIAGNOSIS — Z79899 Other long term (current) drug therapy: Secondary | ICD-10-CM | POA: Insufficient documentation

## 2020-02-04 DIAGNOSIS — R739 Hyperglycemia, unspecified: Secondary | ICD-10-CM

## 2020-02-04 DIAGNOSIS — I10 Essential (primary) hypertension: Secondary | ICD-10-CM | POA: Insufficient documentation

## 2020-02-04 DIAGNOSIS — N760 Acute vaginitis: Secondary | ICD-10-CM | POA: Insufficient documentation

## 2020-02-04 LAB — CBC WITH DIFFERENTIAL/PLATELET
Abs Immature Granulocytes: 0.01 10*3/uL (ref 0.00–0.07)
Basophils Absolute: 0.1 10*3/uL (ref 0.0–0.1)
Basophils Relative: 1 %
Eosinophils Absolute: 0.1 10*3/uL (ref 0.0–0.5)
Eosinophils Relative: 2 %
HCT: 43.8 % (ref 36.0–46.0)
Hemoglobin: 15.3 g/dL — ABNORMAL HIGH (ref 12.0–15.0)
Immature Granulocytes: 0 %
Lymphocytes Relative: 38 %
Lymphs Abs: 2.4 10*3/uL (ref 0.7–4.0)
MCH: 31.4 pg (ref 26.0–34.0)
MCHC: 34.9 g/dL (ref 30.0–36.0)
MCV: 89.8 fL (ref 80.0–100.0)
Monocytes Absolute: 0.6 10*3/uL (ref 0.1–1.0)
Monocytes Relative: 9 %
Neutro Abs: 3.2 10*3/uL (ref 1.7–7.7)
Neutrophils Relative %: 50 %
Platelets: 259 10*3/uL (ref 150–400)
RBC: 4.88 MIL/uL (ref 3.87–5.11)
RDW: 11.6 % (ref 11.5–15.5)
WBC: 6.4 10*3/uL (ref 4.0–10.5)
nRBC: 0 % (ref 0.0–0.2)

## 2020-02-04 LAB — I-STAT BETA HCG BLOOD, ED (MC, WL, AP ONLY): I-stat hCG, quantitative: <5 m[IU]/mL (ref <5)

## 2020-02-04 LAB — BASIC METABOLIC PANEL
Anion gap: 12 (ref 5–15)
BUN: 9 mg/dL (ref 6–20)
CO2: 20 mmol/L — ABNORMAL LOW (ref 22–32)
Calcium: 9.4 mg/dL (ref 8.9–10.3)
Chloride: 100 mmol/L (ref 98–111)
Creatinine, Ser: 0.65 mg/dL (ref 0.44–1.00)
GFR, Estimated: >60 mL/min (ref >60)
Glucose, Bld: 388 mg/dL — ABNORMAL HIGH (ref 70–99)
Potassium: 4.7 mmol/L (ref 3.5–5.1)
Sodium: 132 mmol/L — ABNORMAL LOW (ref 135–145)

## 2020-02-04 LAB — WET PREP, GENITAL
Sperm: NONE SEEN
Trich, Wet Prep: NONE SEEN
Yeast Wet Prep HPF POC: NONE SEEN

## 2020-02-04 MED ORDER — MEGESTROL ACETATE 40 MG PO TABS: ORAL_TABLET | ORAL | 0 refills | Status: AC

## 2020-02-04 MED ORDER — METRONIDAZOLE 500 MG PO TABS: 500.0000 mg | ORAL_TABLET | Freq: Two times a day (BID) | ORAL | 0 refills | Status: DC

## 2020-02-04 NOTE — ED Provider Notes (Signed)
[facility] DEPT Provider Note   CSN: [id number] Arrival date & time: 02/04/20  1446     History Chief Complaint  Patient presents with  . Vaginal Bleeding    Laura Ashley is a 29 y.o. female with past medical history of hyper tension, PCOS, DM, morbid obesity, dysfunctional uterine bleeding who presents today for evaluation of vaginal bleeding.  She reports that since November 4 or 5 when her period started she has since continued to bleed.  She has been taking birth control as she usually does which normally stops her cycle however she has continued to bleed.  She reports that the bleeding had initially let up about 10 days ago, however then after a few days returned to heavy bleeding.  She states she is changing a tampon once or twice every hour.  She does not take any blood thinning medications.  She reports her PCP was hesitant to start her on Megace given her history of hypertension, DM, and PCOS.  Patient states that she has never had vaginal bleeding this severe before. She also reports irritation and pain in her vulva.  She denies any new tampons, menstrual products, body washes, soaps lotions detergents or any other new products.  No fevers.  She states that she was not having abnormal vaginal discharge prior to the onset of her bleeding.  She states that she feels like she may have developed BV since she has been bleeding.  She denies any fevers.  No dysuria, increased frequency or urgency.  No nausea vomiting or diarrhea.  She does report that it point she has had severe pain in her pelvis however it has been bilateral and she is not currently having severe pain.  When she did have severe pain that only lasted for a few minutes before it eased off.  She is sexually active with one female partner.  She denies concern for trauma or tear causing the bleeding.   Patient does report mild nausea but is noted to be drinking cheer wine soda and eating  hot/spicy chips in exam room.   HPI     Past Medical History:  Diagnosis Date  . Diabetes mellitus without complication ([place])   . Medical history non-contributory   . PCOS (polycystic ovarian syndrome)     Patient Active Problem List   Diagnosis Date Noted  . 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 Elevated blood pressure reading 09/25/2018  . Class 3 severe obesity due to excess calories with serious comorbidity and body mass index (BMI) of 45.0 to 49.9 in adult ([place]) 09/25/2018  . History of PCOS 09/25/2018  . Morbidly obese ([place]) 02/28/2014  . Hypertension 02/28/2014  . Dysfunctional uterine bleeding 02/28/2014    Past Surgical History:  Procedure Laterality Date  . NO PAST SURGERIES       OB History    Gravida  2   Para      Term      Preterm      AB  2   Living  0     SAB  2   TAB      Ectopic      Multiple      Live Births              Family History  Problem Relation Age of Onset  . Diabetes Father   . Diabetes Other   . Healthy Mother     Social History   Tobacco  Use  . Smoking status: Former Smoker  . Smokeless tobacco: Never Used  Vaping Use  . Vaping Use: Former  Substance Use Topics  . Alcohol use: Yes  . Drug use: No    Home Medications Prior to Admission medications   Medication Sig Start Date End Date Taking? Authorizing Provider  Blood Glucose Monitoring Suppl (TRUE METRIX METER) w/Device KIT Use as directed 09/25/18   Ladell Pier, MD  Dulaglutide (TRULICITY) 9.93 ZJ/6.9CV SOPN Inject 0.75 mg into the skin once a week. 05/18/19   Camillia Herter, NP  famotidine (PEPCID) 20 MG tablet Take 1 tablet (20 mg total) by mouth 2 (two) times daily for 14 days. 09/08/18 09/22/18  Couture, Cortni S, PA-C  glucose blood (TRUE METRIX BLOOD GLUCOSE TEST) test strip Use as instructed 05/18/19   Camillia Herter, NP  insulin glargine (LANTUS SOLOSTAR) 100 UNIT/ML Solostar Pen Inject 26 Units  into the skin daily. Must have office visit for refills 09/03/19   Ladell Pier, MD  Insulin Pen Needle (B-D UF III MINI PEN NEEDLES) 31G X 5 MM MISC Use as directed 05/18/19   Camillia Herter, NP  levonorgestrel-ethinyl estradiol (ALESSE) 0.1-20 MG-MCG tablet Take 1 tablet by mouth daily. 09/03/19   Ladell Pier, MD  lisinopril (ZESTRIL) 5 MG tablet Take 1 tablet (5 mg total) by mouth daily. 05/18/19   Camillia Herter, NP  megestrol (MEGACE) 40 MG tablet Take 1 tablet (40 mg total) by mouth 3 (three) times daily for 5 days, THEN 1 tablet (40 mg total) 2 (two) times daily for 5 days, THEN 1 tablet (40 mg total) daily for 20 days. 02/04/20 03/05/20  Lorin Glass, PA-C  metFORMIN (GLUCOPHAGE) 1000 MG tablet Take 0.5 tablets (500 mg total) by mouth daily with breakfast. Must have office visit for refills 09/03/19   Ladell Pier, MD  metroNIDAZOLE (FLAGYL) 500 MG tablet Take 1 tablet (500 mg total) by mouth 2 (two) times daily. 02/04/20   Lorin Glass, PA-C  TRUEplus Lancets 28G MISC Use as directed 05/18/19   Camillia Herter, NP    Allergies    Penicillins  Review of Systems   Review of Systems  Constitutional: Negative for chills and fever.  Gastrointestinal: Negative for abdominal pain, nausea and vomiting.  Genitourinary: Positive for vaginal bleeding and vaginal pain.  Musculoskeletal: Negative for back pain and neck pain.  Skin: Negative for color change and rash.  Neurological: Negative for dizziness and weakness.  All other systems reviewed and are negative.   Physical Exam Updated Vital Signs BP 136/82   Pulse 78   Temp 98.1 F (36.7 C)   Resp 16   Ht $R'5\' 4"'IM$  (1.626 m)   Wt 111.6 kg   LMP 02/04/2020   SpO2 97%   BMI 42.23 kg/m   Physical Exam Vitals and nursing note reviewed. Exam conducted with a chaperone present (Female RN).  Constitutional:      General: She is not in acute distress.    Appearance: She is well-developed. She is not  diaphoretic.  HENT:     Head: Normocephalic and atraumatic.  Eyes:     General: No scleral icterus.       Right eye: No discharge.        Left eye: No discharge.     Conjunctiva/sclera: Conjunctivae normal.  Cardiovascular:     Rate and Rhythm: Normal rate and regular rhythm.  Pulmonary:     Effort: Pulmonary effort  is normal. No respiratory distress.     Breath sounds: No stridor.  Abdominal:     General: There is no distension.  Genitourinary:    Cervix: No cervical motion tenderness.     Uterus: Not tender.      Adnexa: Right adnexa normal and left adnexa normal.       Right: No mass, tenderness or fullness.         Left: No mass, tenderness or fullness.       Comments: The skin on the vulva is lighter than surrounding skin.  There is no focal lesions.  There is moderate blood in the vaginal canal.  Blood is coming from the cervical os which is closed.   Musculoskeletal:        General: No deformity.     Cervical back: Normal range of motion.  Skin:    General: Skin is warm and dry.  Neurological:     Mental Status: She is alert.     Motor: No abnormal muscle tone.  Psychiatric:        Behavior: Behavior normal.     ED Results / Procedures / Treatments   Labs (all labs ordered are listed, but only abnormal results are displayed) Labs Reviewed  WET PREP, GENITAL - Abnormal; Notable for the following components:      Result Value   Clue Cells Wet Prep HPF POC PRESENT (*)    WBC, Wet Prep HPF POC FEW (*)    All other components within normal limits  CBC WITH DIFFERENTIAL/PLATELET - Abnormal; Notable for the following components:   Hemoglobin 15.3 (*)    All other components within normal limits  BASIC METABOLIC PANEL - Abnormal; Notable for the following components:   Sodium 132 (*)    CO2 20 (*)    Glucose, Bld 388 (*)    All other components within normal limits  I-STAT BETA HCG BLOOD, ED ([facility], [facility], [facility] ONLY)  GC/CHLAMYDIA PROBE AMP ([place]) NOT AT [facility]     EKG None  Radiology No results found.  Procedures Procedures (including critical care time)  Medications Ordered in ED Medications - No data to display  ED Course  I have reviewed the triage vital signs and the nursing notes.  Pertinent labs & imaging results that were available during my care of the patient were reviewed by me and considered in my medical decision making (see chart for details).  Clinical Course as of Feb 03 2237  Mon Feb 04, 2020  8003 I spoke with Dr. Elonda Husky he recommends $RemoveBefor'40mg'[id number]$  TID 5 days BID 5, 1 until she finishes , stay off for a week then re-start OCP   45 tablets    [EH]    Clinical Course User Index [EH] Ollen Gross   MDM Rules/Calculators/A&P                         Patient is a 29 year old woman who presents today for evaluation of vaginal bleeding for 18 days. She has been taking OCPs prescribed by her PCP and is continuing to bleed.    Here her hemoglobin is actually slightly high at 15.3. BMP is significant for hyperglycemia at 388. She was noted to be drinking cherry wine soda and eating spicy chips while I was in the room which I suspect is contributing to her hyperglycemia. She does not have evidence of PID. Wet prep is positive for clue cells. GC testing is  sent. Pregnancy test is negative. Plan to treat BV with Flagyl. Discussed not drinking alcohol while taking this. I discussed patient with Dr. Elonda Husky of OB/GYN, he recommended Megace taper. I discussed risk/benefit of megace with patient.  Given the risks of continued vaginal bleeding, will treat with megace.  Recommended outpatient follow up.    Additionally I recommended patient maintain better control over her diabetes. She reports compliance with her medication except for Metformin as she does not like how that makes her go to the bathroom. I discussed with patient that her glucose is significantly elevated today. Recommended PCP follow-up. She is asymptomatic from this. Her  CO2 is minimally low however do not think that this is contributing to her vaginal bleeding. Recommended oral rehydration at home along with dietary modifications.  Return precautions were discussed with patient who states their understanding.  At the time of discharge patient denied any unaddressed complaints or concerns.  Patient is agreeable for discharge home.  Note: Portions of this report may have been transcribed using voice recognition software. Every effort was made to ensure accuracy; however, inadvertent computerized transcription errors may be present  Final Clinical Impression(s) / ED Diagnoses Final diagnoses:  Vaginal bleeding  Hyperglycemia  BV (bacterial vaginosis)    Rx / DC Orders ED Discharge Orders         Ordered    megestrol (MEGACE) 40 MG tablet        02/04/20 1920    metroNIDAZOLE (FLAGYL) 500 MG tablet  2 times daily        02/04/20 1920           Ollen Gross 02/04/20 2243    Dorie Rank, MD 02/06/20 [phone]

## 2020-02-04 NOTE — ED Triage Notes (Signed)
Patient c/o vaginal bleeding since 01/17/20. patient states she went to her physician and was prescribed birthcontrol pills which she started on 01/21/20, but she continues to have vaginal bleeding

## 2020-02-04 NOTE — Discharge Instructions (Addendum)
Call the [facility] GYN office to see if they still have walk in hours or to set up an appointment.   Please stop taking your birth control and instead start taking Megace, the prescribed medication.  Please take that as directed.  Once you finish all 45 pills then do not take any birth control or hormones for 1 week.  After that please restart taking your normal birth control.  Today your blood sugar was high.  Please make sure that you are eating healthy foods and low sugar.  Please make sure you are increasing the amount of water that you're drinking over the next 2 to 3 days.  With your sugar being high you can get dehydrated which can make cramping and pain worse.  Please take Tylenol (acetaminophen) to relieve your pain.  You may take tylenol, up to 1,000 mg (two extra strength pills).  Do not take more than 3,000 mg tylenol in a 24 hour period.  Please check all medication labels as many medications such as pain and cold medications may contain tylenol. Please do not drink alcohol while taking this medication.

## 2020-02-05 LAB — GC/CHLAMYDIA PROBE AMP (~~LOC~~) NOT AT ARMC
Chlamydia: NEGATIVE
Comment: NEGATIVE
Comment: NORMAL
Neisseria Gonorrhea: NEGATIVE

## 2020-02-20 ENCOUNTER — Telehealth: Payer: Self-pay | Admitting: Internal Medicine

## 2020-02-20 NOTE — Telephone Encounter (Signed)
Will forward to pcp

## 2020-02-20 NOTE — Telephone Encounter (Signed)
Copied from CRM [id number]. Topic: General - Other >> Feb 20, 2020  2:02 PM Dalphine Handing A wrote: Patient is still having issues with vaginal irritation and wants to know if Dr. Laural Benes can send in more metroNIDAZOLE (FLAGYL) 500 MG tablet and prescribe a cream to assist her. Please [facility] [place] [place] [facility] [place] [address] [employer]  Phone:  [phone] Fax:  [phone]

## 2020-02-21 NOTE — Telephone Encounter (Signed)
Contacted pt to ask her of she is able to come do a self swab tomorrow pt states she wouldn't be able to come in till 4pm. I informed pt that her swab will not be sent out till Monday but if she could come in before 3 that would be good. Pt states she is not able to.  I informed pt that she can go to the urgent care if she is not able to make it in. Pt states she will go to the urgent care

## 2020-02-22 ENCOUNTER — Ambulatory Visit: Payer: Self-pay | Admitting: Internal Medicine

## 2020-04-10 ENCOUNTER — Ambulatory Visit: Payer: Self-pay | Admitting: Internal Medicine

## 2020-04-16 MED FILL — METFORMIN HCL 1000 MG TABS: 1000 | 30 days supply | Qty: 15 | Fill #0

## 2020-04-16 MED FILL — TRULICITY 0.75 MG/0.5 ML PE: 0.75 | 28 days supply | Qty: 2 | Fill #1

## 2020-04-17 ENCOUNTER — Encounter: Payer: Self-pay | Admitting: Family Medicine

## 2020-04-17 ENCOUNTER — Ambulatory Visit: Payer: Self-pay | Admitting: Family Medicine

## 2020-04-17 NOTE — Progress Notes (Signed)
Patient did not keep appointment today. She may call to reschedule.  

## 2020-09-29 ENCOUNTER — Other Ambulatory Visit: Payer: Self-pay

## 2020-10-02 ENCOUNTER — Other Ambulatory Visit: Payer: Self-pay | Admitting: Internal Medicine

## 2020-10-02 DIAGNOSIS — E1129 Type 2 diabetes mellitus with other diabetic kidney complication: Secondary | ICD-10-CM

## 2020-10-02 DIAGNOSIS — IMO0002 Reserved for concepts with insufficient information to code with codable children: Secondary | ICD-10-CM

## 2020-10-02 NOTE — Telephone Encounter (Signed)
Copied from CRM [id number]. Topic: Quick Communication - Rx Refill/Question >> Oct 02, 2020  8:38 AM Jaquita Rector A wrote: Medication: metFORMIN (GLUCOPHAGE) 1000 MG tablet, Dulaglutide (TRULICITY) 0.75 MG/0.5ML SOPN   Has the patient contacted their pharmacy? Yes.   (Agent: If no, request that the patient contact the pharmacy for the refill.) (Agent: If yes, when and what did the pharmacy advise?)  Preferred Pharmacy (with phone number or street name): [facility]  Phone:  [phone] Fax:  [phone]     Agent: Please be advised that RX refills may take up to 3 business days. We ask that you follow-up with your pharmacy.

## 2020-10-02 NOTE — Telephone Encounter (Signed)
Notes to clinic:  Patient has upcoming appt on 11/19/2020 Review for refill until that time     Requested Prescriptions  Pending Prescriptions Disp Refills   Dulaglutide (TRULICITY) 8.28 MK/3.4JZ SOPN      Sig: Inject 0.75 mg into the skin once a week.      Endocrinology:  Diabetes - GLP-1 Receptor Agonists Failed - 10/02/2020  8:54 AM      Failed - HBA1C is between 0 and 7.9 and within 180 days    HbA1c, POC (controlled diabetic range)  Date Value Ref Range Status  05/18/2019 10.7 (A) 0.0 - 7.0 % Final          Failed - Valid encounter within last 6 months    Recent Outpatient Visits           1 year ago Uncontrolled type 2 diabetes mellitus with microalbuminuria ([place])   [place] Ladell Pier, MD   1 year ago Uncontrolled type 2 diabetes mellitus with microalbuminuria ([place])   [place], [place] J, NP   1 year ago Encounter for medication review   [place], RPH-CPP   1 year ago Uncontrolled type 2 diabetes mellitus with microalbuminuria ([place])   [place], Deborah B, MD   1 year ago Uncontrolled type 2 diabetes mellitus with microalbuminuria ([place])   [place], MD       Future Appointments             In 1 month Gildardo Pounds, NP [place]               metFORMIN (GLUCOPHAGE) 1000 MG tablet 30 tablet 4    Sig: Take 0.5 tablets (500 mg total) by mouth daily with breakfast. Must have office visit for refills      Endocrinology:  Diabetes - Biguanides Failed - 10/02/2020  8:54 AM      Failed - HBA1C is between 0 and 7.9 and within 180 days    HbA1c, POC (controlled diabetic range)  Date Value Ref Range Status  05/18/2019 10.7 (A) 0.0 - 7.0 % Final          Failed - AA eGFR in normal range and within 360 days     GFR, Est African American  Date Value Ref Range Status  02/28/2014 >89 mL/min Final   GFR calc Af Amer  Date Value Ref Range Status  09/08/2018 >60 >60 mL/min Final   GFR, Est Non African American  Date Value Ref Range Status  02/28/2014 >89 mL/min Final    Comment:      The estimated GFR is a calculation valid for adults (>=48 years old) that uses the CKD-EPI algorithm to adjust for age and sex. It is   not to be used for children, pregnant women, hospitalized patients,    patients on dialysis, or with rapidly changing kidney function. According to the NKDEP, eGFR >89 is normal, 60-89 shows mild impairment, 30-59 shows moderate impairment, 15-29 shows severe impairment and <15 is ESRD.      GFR, Estimated  Date Value Ref Range Status  02/04/2020 >60 >60 mL/min Final    Comment:    (NOTE) Calculated using the CKD-EPI Creatinine Equation (2021)           Failed - Valid encounter within last 6  months    Recent Outpatient Visits           1 year ago Uncontrolled type 2 diabetes mellitus with microalbuminuria ([place])   [place] Karle Plumber B, MD   1 year ago Uncontrolled type 2 diabetes mellitus with microalbuminuria [facility])   [place], [place], NP   1 year ago Encounter for medication review   [place], RPH-CPP   1 year ago Uncontrolled type 2 diabetes mellitus with microalbuminuria [facility])   [place], Deborah B, MD   1 year ago Uncontrolled type 2 diabetes mellitus with microalbuminuria [facility])   [place], MD       Future Appointments             In 1 month Gildardo Pounds, NP [place] in normal range and within 360 days    Creat  Date Value Ref Range Status  02/28/2014 0.86 0.50 -  1.10 mg/dL Final   Creatinine, Ser  Date Value Ref Range Status  02/04/2020 0.65 0.44 - 1.00 mg/dL Final

## 2020-10-04 ENCOUNTER — Other Ambulatory Visit: Payer: Self-pay | Admitting: Internal Medicine

## 2020-10-04 DIAGNOSIS — E1129 Type 2 diabetes mellitus with other diabetic kidney complication: Secondary | ICD-10-CM

## 2020-10-04 DIAGNOSIS — IMO0002 Reserved for concepts with insufficient information to code with codable children: Secondary | ICD-10-CM

## 2020-10-04 MED ORDER — METFORMIN HCL 1000 MG PO TABS
500.0000 mg | ORAL_TABLET | Freq: Every day | ORAL | 1 refills | Status: DC
  Filled 2020-10-04: qty 30, 60d supply, fill #0

## 2020-10-04 MED ORDER — TRULICITY 0.75 MG/0.5ML ~~LOC~~ SOAJ
0.7500 mg | SUBCUTANEOUS | 1 refills | Status: DC
  Filled 2020-10-04: qty 2, 28d supply, fill #0

## 2020-10-06 ENCOUNTER — Other Ambulatory Visit: Payer: Self-pay

## 2020-10-08 ENCOUNTER — Encounter (HOSPITAL_COMMUNITY): Payer: Self-pay

## 2020-10-08 ENCOUNTER — Other Ambulatory Visit: Payer: Self-pay

## 2020-10-08 ENCOUNTER — Ambulatory Visit (HOSPITAL_COMMUNITY)
Admission: EM | Admit: 2020-10-08 | Discharge: 2020-10-08 | Disposition: A | Discharge status: Home / Self Care | Payer: Self-pay | Attending: Emergency Medicine | Admitting: Emergency Medicine

## 2020-10-08 DIAGNOSIS — R059 Cough, unspecified: Secondary | ICD-10-CM | POA: Insufficient documentation

## 2020-10-08 DIAGNOSIS — Z87891 Personal history of nicotine dependence: Secondary | ICD-10-CM | POA: Insufficient documentation

## 2020-10-08 DIAGNOSIS — Z880 Allergy status to penicillin: Secondary | ICD-10-CM | POA: Insufficient documentation

## 2020-10-08 DIAGNOSIS — Z793 Long term (current) use of hormonal contraceptives: Secondary | ICD-10-CM | POA: Insufficient documentation

## 2020-10-08 DIAGNOSIS — Z7984 Long term (current) use of oral hypoglycemic drugs: Secondary | ICD-10-CM | POA: Insufficient documentation

## 2020-10-08 DIAGNOSIS — Z794 Long term (current) use of insulin: Secondary | ICD-10-CM | POA: Insufficient documentation

## 2020-10-08 DIAGNOSIS — J069 Acute upper respiratory infection, unspecified: Secondary | ICD-10-CM | POA: Insufficient documentation

## 2020-10-08 DIAGNOSIS — Z79899 Other long term (current) drug therapy: Secondary | ICD-10-CM | POA: Insufficient documentation

## 2020-10-08 DIAGNOSIS — Z20822 Contact with and (suspected) exposure to covid-19: Secondary | ICD-10-CM | POA: Insufficient documentation

## 2020-10-08 LAB — SARS CORONAVIRUS 2 (TAT 6-24 HRS): SARS Coronavirus 2: NEGATIVE

## 2020-10-08 NOTE — Discharge Instructions (Signed)

## 2020-10-08 NOTE — ED Triage Notes (Signed)
Pt presents with cough, sneezing, nasal drainage, and headache X 2 days.

## 2020-10-08 NOTE — ED Provider Notes (Signed)
[name]    CSN: [id number] Arrival date & time: 10/08/20  1105      History   Chief Complaint Chief Complaint  Patient presents with   URI   Headache    HPI Laura Ashley is a 30 y.o. female.   Patient here for evaluation of cough, nasal congestion, and headache that has been ongoing for the past 2 days.  Denies any known exposure to COVID.  Has not taken any OTC medications or treatments.  Denies any trauma, injury, or other precipitating event.  Denies any specific alleviating or aggravating factors.  Denies any fevers, chest pain, N/V/D, numbness, tingling, weakness, abdominal pain, or headaches.     The history is provided by the patient.  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[place]) 10/27/2018   Elevated blood pressure reading 09/25/2018   Class 3 severe obesity due to excess calories with serious comorbidity and body mass index (BMI) of 45.0 to 49.9 in adult ([place]) 09/25/2018   History of PCOS 09/25/2018   Morbidly obese ([place]) 02/28/2014   Hypertension 02/28/2014   Dysfunctional uterine bleeding 02/28/2014    Past Surgical History:  Procedure Laterality Date   NO PAST SURGERIES      OB History     Gravida  2   Para      Term      Preterm      AB  2   Living  0      SAB  2   IAB      Ectopic      Multiple      Live Births               Home Medications    Prior to Admission medications   Medication Sig Start Date End Date Taking? Authorizing Provider  Blood Glucose Monitoring Suppl (TRUE METRIX  METER) w/Device KIT Use as directed 09/25/18   Ladell Pier, MD  Dulaglutide (TRULICITY) 5.63 SL/3.7DS SOPN Inject 0.75 mg into the skin once a week. Must keep appt 11/2020 for further refills 10/04/20   Ladell Pier, MD  famotidine (PEPCID) 20 MG tablet Take 1 tablet (20 mg total) by mouth 2 (two) times daily for 14 days. 09/08/18 09/22/18  Couture, Cortni S, PA-C  glucose blood (TRUE METRIX BLOOD GLUCOSE TEST) test strip Use as instructed 05/18/19   Camillia Herter, NP  insulin glargine (LANTUS SOLOSTAR) 100 UNIT/ML Solostar Pen Inject 26 Units into the skin daily. Must have office visit for refills 09/03/19   Ladell Pier, MD  Insulin Pen Needle (B-D UF III MINI PEN NEEDLES) 31G X 5 MM MISC Use as directed 05/18/19   Camillia Herter, NP  levonorgestrel-ethinyl estradiol (ALESSE) 0.1-20 MG-MCG tablet TAKE 1 TABLET BY MOUTH DAILY. 09/03/19 09/02/20  Ladell Pier, MD  lisinopril (ZESTRIL) 5 MG tablet Take 1 tablet (5 mg total) by mouth daily. 05/18/19   Camillia Herter, NP  metFORMIN (GLUCOPHAGE) 1000 MG tablet Take 0.5 tablets (500 mg total) by mouth daily with breakfast. Must keep appt 11/2020 for further refills  10/04/20   Ladell Pier, MD  metroNIDAZOLE (FLAGYL) 500 MG tablet Take 1 tablet (500 mg total) by mouth 2 (two) times daily. 02/04/20   Lorin Glass, PA-C  TRUEplus Lancets 28G MISC Use as directed 05/18/19   Camillia Herter, NP    Family History Family History  Problem Relation Age of Onset   Diabetes Father    Diabetes Other    Healthy Mother     Social History Social History   Tobacco Use   Smoking status: Former   Smokeless tobacco: Never  Scientific laboratory technician Use: Former  Substance Use Topics   Alcohol use: Yes   Drug use: No     Allergies   Penicillins   Review of Systems Review of Systems  Constitutional:  Positive for chills and fatigue.  HENT:  Positive for congestion.   Respiratory:  Positive for cough.   Musculoskeletal:  Positive  for myalgias.  Neurological:  Positive for headaches.  All other systems reviewed and are negative.   Physical Exam Triage Vital Signs ED Triage Vitals  Enc Vitals Group     BP 10/08/20 1225 (!) 131/91     Pulse Rate 10/08/20 1225 89     Resp 10/08/20 1225 17     Temp 10/08/20 1225 98.8 F (37.1 C)     Temp Source 10/08/20 1225 Oral     SpO2 10/08/20 1225 98 %     Weight --      Height --      Head Circumference --      Peak Flow --      Pain Score 10/08/20 1223 6     Pain Loc --      Pain Edu? --      Excl. in [place]? --    No data found.  Updated Vital Signs BP (!) 131/91 (BP Location: Right Arm)   Pulse 89   Temp 98.8 F (37.1 C) (Oral)   Resp 17   SpO2 98%   Visual Acuity Right Eye Distance:   Left Eye Distance:   Bilateral Distance:    Right Eye Near:   Left Eye Near:    Bilateral Near:     Physical Exam Vitals and nursing note reviewed.  Constitutional:      General: She is not in acute distress.    Appearance: Normal appearance. She is not ill-appearing, toxic-appearing or diaphoretic.  HENT:     Head: Normocephalic and atraumatic.     Nose: Congestion present.     Mouth/Throat:     Mouth: Mucous membranes are moist.     Pharynx: Oropharynx is clear. No oropharyngeal exudate or posterior oropharyngeal erythema.  Eyes:     Conjunctiva/sclera: Conjunctivae normal.     Pupils: Pupils are equal, round, and reactive to light.  Cardiovascular:     Rate and Rhythm: Normal rate.     Pulses: Normal pulses.     Heart sounds: Normal heart sounds.  Pulmonary:     Effort: Pulmonary effort is normal.     Breath sounds: Normal breath sounds.  Abdominal:     General: Abdomen is flat.  Musculoskeletal:        General: Normal range of motion.     Cervical back: Normal range of motion.  Skin:    General: Skin is warm and dry.  Neurological:     General: No focal deficit present.     Mental Status: She is alert and oriented to person,  place, and time.      GCS: GCS eye subscore is 4. GCS verbal subscore is 5. GCS motor subscore is 6.  Psychiatric:        Mood and Affect: Mood normal.     UC Treatments / Results  Labs (all labs ordered are listed, but only abnormal results are displayed) Labs Reviewed  SARS CORONAVIRUS 2 (TAT 6-24 HRS)    EKG   Radiology No results found.  Procedures Procedures (including critical care time)  Medications Ordered in UC Medications - No data to display  Initial Impression / Assessment and Plan / UC Course  I have reviewed the triage vital signs and the nursing notes.  Pertinent labs & imaging results that were available during my care of the patient were reviewed by me and considered in my medical decision making (see chart for details).    Assessment negative for red flags or concerns.  COVID test pending.  Discussed need to quarantine while waiting for results and then for at least 5 days if test is positive.  Work note given to patient.  Discussed conservative symptom management as described in discharge instructions.  Encouraged fluids and rest.  Follow up with primary care as needed.   Final Clinical Impressions(s) / UC Diagnoses   Final diagnoses:  Viral URI with cough     Discharge Instructions      We will contact you if your COVID test is positive.  Please quarantine while you wait for the results.  If your test is negative you may resume normal activities.  If your test is positive please continue to quarantine for at least 5 days from your symptom onset or until you are without a fever for at least 24 hours after the medications.  You can take Tylenol and/or Ibuprofen as needed for fever reduction and pain relief.   For cough: honey 1/2 to 1 teaspoon (you can dilute the honey in water or another fluid).  You can also use guaifenesin and dextromethorphan for cough. You can use a humidifier for chest congestion and cough.  If you don't have a humidifier, you can sit in the bathroom  with the hot shower running.     For sore throat: try warm salt water gargles, cepacol lozenges, throat spray, warm tea or water with lemon/honey, popsicles or ice, or OTC cold relief medicine for throat discomfort.    For congestion: take a daily anti-histamine like Zyrtec, Claritin, and a oral decongestant, such as pseudoephedrine.  You can also use Flonase 1-2 sprays in each nostril daily.    It is important to stay hydrated: drink plenty of fluids (water, gatorade/powerade/pedialyte, juices, or teas) to keep your throat moisturized and help further relieve irritation/discomfort.   Return or go to the Emergency Department if symptoms worsen or do not improve in the next few days.      ED Prescriptions   None    PDMP not reviewed this encounter.   Pearson Forster, NP 10/08/20 1312

## 2020-10-13 ENCOUNTER — Other Ambulatory Visit: Payer: Self-pay

## 2020-11-13 ENCOUNTER — Other Ambulatory Visit: Payer: Self-pay

## 2020-11-19 ENCOUNTER — Other Ambulatory Visit: Payer: Self-pay

## 2020-11-19 ENCOUNTER — Encounter: Payer: Self-pay | Admitting: Nurse Practitioner

## 2020-11-19 ENCOUNTER — Ambulatory Visit: Payer: Self-pay | Attending: Nurse Practitioner | Admitting: Nurse Practitioner

## 2020-11-19 VITALS — BP 139/93 | HR 84 | Ht 64.0 in | Wt 242.5 lb

## 2020-11-19 DIAGNOSIS — K219 Gastro-esophageal reflux disease without esophagitis: Secondary | ICD-10-CM

## 2020-11-19 DIAGNOSIS — IMO0002 Reserved for concepts with insufficient information to code with codable children: Secondary | ICD-10-CM

## 2020-11-19 DIAGNOSIS — Z8742 Personal history of other diseases of the female genital tract: Secondary | ICD-10-CM

## 2020-11-19 DIAGNOSIS — E1129 Type 2 diabetes mellitus with other diabetic kidney complication: Secondary | ICD-10-CM

## 2020-11-19 DIAGNOSIS — I10 Essential (primary) hypertension: Secondary | ICD-10-CM

## 2020-11-19 DIAGNOSIS — N938 Other specified abnormal uterine and vaginal bleeding: Secondary | ICD-10-CM

## 2020-11-19 DIAGNOSIS — E1165 Type 2 diabetes mellitus with hyperglycemia: Secondary | ICD-10-CM

## 2020-11-19 DIAGNOSIS — R809 Proteinuria, unspecified: Secondary | ICD-10-CM

## 2020-11-19 MED ORDER — TRUE METRIX BLOOD GLUCOSE TEST VI STRP
ORAL_STRIP | 12 refills | Status: DC
  Filled 2020-11-19: qty 100, 25d supply, fill #0
  Filled 2020-12-05: qty 100, 30d supply, fill #0

## 2020-11-19 MED ORDER — TRULICITY 0.75 MG/0.5ML ~~LOC~~ SOAJ
0.7500 mg | SUBCUTANEOUS | 1 refills | Status: DC
  Filled 2020-11-19 – 2020-12-05 (×2): qty 2, 28d supply, fill #0
  Filled 2021-02-10: qty 2, 28d supply, fill #1

## 2020-11-19 MED ORDER — FAMOTIDINE 20 MG PO TABS: 20.0000 mg | ORAL_TABLET | Freq: Two times a day (BID) | ORAL | 1 refills | Status: DC

## 2020-11-19 MED ORDER — FAMOTIDINE 20 MG PO TABS
20.0000 mg | ORAL_TABLET | Freq: Two times a day (BID) | ORAL | 1 refills | Status: DC
  Filled 2020-11-19: qty 60, 30d supply, fill #0

## 2020-11-19 MED ORDER — LANTUS SOLOSTAR 100 UNIT/ML ~~LOC~~ SOPN
26.0000 [IU] | PEN_INJECTOR | Freq: Every day | SUBCUTANEOUS | 1 refills | Status: DC
  Filled 2020-11-19: qty 9, 34d supply, fill #0
  Filled 2020-12-05: qty 6, 23d supply, fill #0
  Filled 2021-02-10: qty 6, 23d supply, fill #1

## 2020-11-19 MED ORDER — TECHLITE PEN NEEDLES 31G X 5 MM MISC
3 refills | Status: DC
  Filled 2020-11-19: qty 100, 25d supply, fill #0

## 2020-11-19 MED ORDER — METFORMIN HCL 500 MG PO TABS
500.0000 mg | ORAL_TABLET | Freq: Every day | ORAL | 1 refills | Status: DC
  Filled 2020-11-19 – 2020-12-05 (×2): qty 30, 30d supply, fill #0
  Filled 2021-02-10: qty 30, 30d supply, fill #1

## 2020-11-19 MED ORDER — LISINOPRIL 5 MG PO TABS
5.0000 mg | ORAL_TABLET | Freq: Every day | ORAL | 3 refills | Status: DC
  Filled 2020-11-19: qty 30, 30d supply, fill #0

## 2020-11-19 NOTE — Patient Instructions (Signed)
[facility] [employer] [facility] clinic in [place], [place] Address: [address], [place], [place] [address] Open ? Closes 5PM Phone: [phone]

## 2020-11-19 NOTE — Progress Notes (Signed)
Assessment & Plan:  Diagnoses and all orders for this visit:  Uncontrolled type 2 diabetes mellitus with microalbuminuria (HCC) -     CMP14+EGFR -     Hemoglobin A1c -     insulin glargine (LANTUS SOLOSTAR) 100 UNIT/ML Solostar Pen; Inject 26 Units into the skin daily. -     glucose blood (TRUE METRIX BLOOD GLUCOSE TEST) test strip; Use as instructed -     Dulaglutide (TRULICITY) 9.67 EL/3.8BO SOPN; Inject 0.75 mg into the skin once a week. -     Insulin Pen Needle (TECHLITE PEN NEEDLES) 31G X 5 MM MISC; use as directed -     metFORMIN (GLUCOPHAGE) 500 MG tablet; Take 1 tablet (500 mg total) by mouth daily with breakfast. Continue blood sugar control as discussed in office today, low carbohydrate diet, and regular physical exercise as tolerated, 150 minutes per week (30 min each day, 5 days per week, or 50 min 3 days per week). Keep blood sugar logs with fasting goal of 90-130 mg/dl, post prandial (after you eat) less than 180.  For Hypoglycemia: BS <60 and Hyperglycemia BS >400; contact the clinic ASAP. Annual eye exams and foot exams are recommended.   Essential hypertension -     lisinopril (ZESTRIL) 5 MG tablet; Take 1 tablet (5 mg total) by mouth daily. Continue all antihypertensives as prescribed.  Remember to bring in your blood pressure log with you for your follow up appointment.  DASH/Mediterranean Diets are healthier choices for HTN.    History of PCOS -     metFORMIN (GLUCOPHAGE) 500 MG tablet; Take 1 tablet (500 mg total) by mouth daily with breakfast. -     Ambulatory referral to Gynecology  Dysfunctional uterine bleeding -     Ambulatory referral to Gynecology  GERD without esophagitis -     famotidine (PEPCID) 20 MG tablet; Take 1 tablet (20 mg total) by mouth 2 (two) times daily. INSTRUCTIONS: Avoid GERD Triggers: acidic, spicy or fried foods, caffeine, coffee, sodas,  alcohol and chocolate.     Patient has been counseled on age-appropriate routine health  concerns for screening and prevention. These are reviewed and up-to-date. Referrals have been placed accordingly. Immunizations are up-to-date or declined.    Subjective:  No chief complaint on file.  HPI Laura Ashley 30 y.o. female presents to office today for medication refills.  She has a past medical history of DM2 and PCOS with AUB. She would like referral to Gynecology for this. She has been on OCPs in the past but state this did not help to regulate her periods and actually made her cycles heavier.    DM 2 Poorly controlled. She has been out of medications since January or February. States Last night's blood glucose reading was 225. She does not monitor her blood glucose levels every day.  Will restart trulicity .75 mg weekly, metformin 500 mg daily and lantus 26 units daily . Lab Results  Component Value Date   HGBA1C 12.0 (H) 11/19/2020     HTN Blood pressure is elevated. Will start lisinopril 5 mg today. Denies chest pain, shortness of breath, palpitations, lightheadedness, dizziness, headaches or BLE edema.    BP Readings from Last 3 Encounters:  11/19/20 (!) 139/93  10/08/20 (!) 131/91  02/04/20 136/82       Review of Systems  Constitutional:  Negative for fever, malaise/fatigue and weight loss.  HENT: Negative.  Negative for nosebleeds.   Eyes: Negative.  Negative for blurred vision, double vision  and photophobia.  Respiratory: Negative.  Negative for cough and shortness of breath.   Cardiovascular: Negative.  Negative for chest pain, palpitations and leg swelling.  Gastrointestinal:  Positive for heartburn. Negative for nausea and vomiting.  Genitourinary:        AUB  Musculoskeletal: Negative.  Negative for myalgias.  Neurological: Negative.  Negative for dizziness, focal weakness, seizures and headaches.  Psychiatric/Behavioral: Negative.  Negative for suicidal ideas.    Past Medical History:  Diagnosis Date   Diabetes mellitus without complication ([place])     Medical history non-contributory    PCOS (polycystic ovarian syndrome)     Past Surgical History:  Procedure Laterality Date   NO PAST SURGERIES      Family History  Problem Relation Age of Onset   Diabetes Father    Diabetes Other    Healthy Mother     Social History Reviewed with no changes to be made today.   Outpatient Medications Prior to Visit  Medication Sig Dispense Refill   Blood Glucose Monitoring Suppl (TRUE METRIX METER) w/Device KIT Use as directed 1 kit 0   TRUEplus Lancets 28G MISC Use as directed 100 each 4   Dulaglutide (TRULICITY) 5.70 VX/7.9TJ SOPN Inject 0.75 mg into the skin once a week. Must keep appt 11/2020 for further refills 2 mL 1   glucose blood (TRUE METRIX BLOOD GLUCOSE TEST) test strip Use as instructed 100 each 12   insulin glargine (LANTUS SOLOSTAR) 100 UNIT/ML Solostar Pen Inject 26 Units into the skin daily. Must have office visit for refills 5 pen 6   Insulin Pen Needle (B-D UF III MINI PEN NEEDLES) 31G X 5 MM MISC Use as directed 100 each 3   lisinopril (ZESTRIL) 5 MG tablet Take 1 tablet (5 mg total) by mouth daily. 90 tablet 3   metFORMIN (GLUCOPHAGE) 1000 MG tablet Take 0.5 tablets (500 mg total) by mouth daily with breakfast. Must keep appt 11/2020 for further refills 30 tablet 1   famotidine (PEPCID) 20 MG tablet Take 1 tablet (20 mg total) by mouth 2 (two) times daily for 14 days. 28 tablet 0   levonorgestrel-ethinyl estradiol (ALESSE) 0.1-20 MG-MCG tablet TAKE 1 TABLET BY MOUTH DAILY. 28 tablet 5   metroNIDAZOLE (FLAGYL) 500 MG tablet Take 1 tablet (500 mg total) by mouth 2 (two) times daily. (Patient not taking: Reported on 11/19/2020) 14 tablet 0   No facility-administered medications prior to visit.    Allergies  Allergen Reactions   Penicillins Rash    Has patient had a PCN reaction causing immediate rash, facial/tongue/throat swelling, SOB or lightheadedness with hypotension: Yes Has patient had a PCN reaction causing severe rash  involving mucus membranes or skin necrosis: Yes Has patient had a PCN reaction that required hospitalization: No Has patient had a PCN reaction occurring within the last 10 years: No If all of the above answers are "NO", then may proceed with Cephalosporin use.       Objective:    BP (!) 139/93   Pulse 84   Ht 5' 4"  (1.626 m)   Wt 242 lb 8 oz (110 kg)   SpO2 99%   BMI 41.63 kg/m  Wt Readings from Last 3 Encounters:  11/19/20 242 lb 8 oz (110 kg)  02/04/20 246 lb (111.6 kg)  05/18/19 281 lb 3.2 oz (127.6 kg)    Physical Exam Vitals and nursing note reviewed.  Constitutional:      Appearance: She is well-developed.  HENT:  Head: Normocephalic and atraumatic.  Cardiovascular:     Rate and Rhythm: Normal rate and regular rhythm.     Heart sounds: Normal heart sounds. No murmur heard.   No friction rub. No gallop.  Pulmonary:     Effort: Pulmonary effort is normal. No tachypnea or respiratory distress.     Breath sounds: Normal breath sounds. No decreased breath sounds, wheezing, rhonchi or rales.  Chest:     Chest wall: No tenderness.  Abdominal:     General: Bowel sounds are normal.     Palpations: Abdomen is soft.  Musculoskeletal:        General: Normal range of motion.     Cervical back: Normal range of motion.  Skin:    General: Skin is warm and dry.  Neurological:     Mental Status: She is alert and oriented to person, place, and time.     Coordination: Coordination normal.  Psychiatric:        Behavior: Behavior normal. Behavior is cooperative.        Thought Content: Thought content normal.        Judgment: Judgment normal.         Patient has been counseled extensively about nutrition and exercise as well as the importance of adherence with medications and regular follow-up. The patient was given clear instructions to go to ER or return to medical center if symptoms don't improve, worsen or new problems develop. The patient verbalized understanding.    Follow-up: Return for see luke 4 weeks BP check. see Johnson in 3 months.   Gildardo Pounds, FNP-BC [facility] [place], [place]   11/30/2020, 6:41 PM

## 2020-11-20 LAB — CMP14+EGFR
ALT: 12 IU/L (ref 0–32)
AST: 12 IU/L (ref 0–40)
Albumin/Globulin Ratio: 1.7 (ref 1.2–2.2)
Albumin: 4.4 g/dL (ref 3.9–5.0)
Alkaline Phosphatase: 100 IU/L (ref 44–121)
BUN/Creatinine Ratio: 13 (ref 9–23)
BUN: 9 mg/dL (ref 6–20)
Bilirubin Total: 0.5 mg/dL (ref 0.0–1.2)
CO2: 22 mmol/L (ref 20–29)
Calcium: 10 mg/dL (ref 8.7–10.2)
Chloride: 94 mmol/L — ABNORMAL LOW (ref 96–106)
Creatinine, Ser: 0.67 mg/dL (ref 0.57–1.00)
Globulin, Total: 2.6 g/dL (ref 1.5–4.5)
Glucose: 461 mg/dL — ABNORMAL HIGH (ref 65–99)
Potassium: 4.7 mmol/L (ref 3.5–5.2)
Sodium: 132 mmol/L — ABNORMAL LOW (ref 134–144)
Total Protein: 7 g/dL (ref 6.0–8.5)
eGFR: 123 mL/min/{1.73_m2} (ref >59)

## 2020-11-20 LAB — HEMOGLOBIN A1C
Est. average glucose Bld gHb Est-mCnc: 298 mg/dL
Hgb A1c MFr Bld: 12 % — ABNORMAL HIGH (ref 4.8–5.6)

## 2020-11-25 ENCOUNTER — Other Ambulatory Visit: Payer: Self-pay

## 2020-11-26 ENCOUNTER — Other Ambulatory Visit: Payer: Self-pay

## 2020-11-30 ENCOUNTER — Encounter: Payer: Self-pay | Admitting: Nurse Practitioner

## 2020-12-05 ENCOUNTER — Other Ambulatory Visit: Payer: Self-pay

## 2020-12-17 ENCOUNTER — Ambulatory Visit: Payer: Self-pay | Admitting: Pharmacist

## 2021-02-10 ENCOUNTER — Telehealth: Payer: Self-pay | Admitting: Internal Medicine

## 2021-02-10 ENCOUNTER — Ambulatory Visit: Payer: Self-pay | Admitting: *Deleted

## 2021-02-10 ENCOUNTER — Other Ambulatory Visit: Payer: Self-pay

## 2021-02-10 NOTE — Telephone Encounter (Signed)
Reason for Disposition  [1] Symptoms of a "yeast infection" (i.e., itchy, white discharge, not bad smelling) AND [2] not improved > 3 days following CARE ADVICE  Answer Assessment - Initial Assessment Questions 1. DISCHARGE: "Describe the discharge." (e.g., white, yellow, green, gray, foamy, cottage cheese-like)     I returned pt's call.   Itching, irritation on labia, white discharge thin. 2. ODOR: "Is there a bad odor?"     Yes It's not fishy.  It's a yeast odor.   Sometimes BV and I'm diabetic too. 3. ONSET: "When did the discharge begin?"     When period stopped a couple of days ago when my period was getting lighter.   4. RASH: "Is there a rash in that area?" If Yes, ask: "Describe it." (e.g., redness, blisters, sores, bumps)     It may be.   Not burning mostly itching 5. ABDOMINAL PAIN: "Are you having any abdominal pain?" If Yes, ask: "What does it feel like? " (e.g., crampy, dull, intermittent, constant)      No   6. ABDOMINAL PAIN SEVERITY: If present, ask: "How bad is it?"  (e.g., mild, moderate, severe)  - MILD - doesn't interfere with normal activities   - MODERATE - interferes with normal activities or awakens from sleep   - SEVERE - patient doesn't want to move (R/O peritonitis)      N/A 7. CAUSE: "What do you think is causing the discharge?" "Have you had the same problem before? What happened then?"     I think the yeast infection.   8. OTHER SYMPTOMS: "Do you have any other symptoms?" (e.g., fever, itching, vaginal bleeding, pain with urination, injury to genital area, vaginal foreign body)     No urinary symptoms. 9. PREGNANCY: "Is there any chance you are pregnant?" "When was your last menstrual period?"     No Just finished period  Protocols used: Vaginal Discharge-A-AH

## 2021-02-10 NOTE — Telephone Encounter (Signed)
I returned pt's call.   She had called in earlier c/o possible yeast infection for a week and drinking cranberry juice was not helping. She is having itching on her labia and a thin, white vaginal discharge with a mild odor to it.   "It smells like yeast, not fish".   She is just finishing her period which was heavy this month because it came later than her usual.   She has diabetes which "I know can contribute to this too".    See triage notes.  There were no appts available at [facility] until the end of Jan. 2023.   I suggested she go to the urgent care today so she could go a head and be treated for the vaginal infection if that's what it is.   She was agreeable to this plan.

## 2021-02-10 NOTE — Telephone Encounter (Signed)
Medication Refill - Medication: metFORMIN (GLUCOPHAGE) 500 MG tablet, Dulaglutide (TRULICITY) 0.75 MG/0.5ML SOPN, insulin glargine (LANTUS SOLOSTAR) 100 UNIT/ML Solostar Pen  Has the patient contacted their pharmacy? Yes.   (Agent: If no, request that the patient contact the pharmacy for the refill. If patient does not wish to contact the pharmacy document the reason why and proceed with request.) (Agent: If yes, when and what did the pharmacy advise?)  Preferred Pharmacy (with phone number or street name): [facility] Has the patient been seen for an appointment in the last year OR does the patient have an upcoming appointment? Yes.    Agent: Please be advised that RX refills may take up to 3 business days. We ask that you follow-up with your pharmacy.

## 2021-02-12 ENCOUNTER — Other Ambulatory Visit: Payer: Self-pay | Admitting: Internal Medicine

## 2021-02-12 DIAGNOSIS — Z8742 Personal history of other diseases of the female genital tract: Secondary | ICD-10-CM

## 2021-02-12 DIAGNOSIS — E669 Obesity, unspecified: Secondary | ICD-10-CM

## 2021-02-12 DIAGNOSIS — E1169 Type 2 diabetes mellitus with other specified complication: Secondary | ICD-10-CM

## 2021-02-12 MED ORDER — LANTUS SOLOSTAR 100 UNIT/ML ~~LOC~~ SOPN: 26.0000 [IU] | PEN_INJECTOR | Freq: Every day | SUBCUTANEOUS | 4 refills | Status: DC

## 2021-02-12 MED ORDER — TRULICITY 0.75 MG/0.5ML ~~LOC~~ SOAJ: 0.7500 mg | SUBCUTANEOUS | 6 refills | Status: DC

## 2021-02-12 MED ORDER — METFORMIN HCL 500 MG PO TABS: 500.0000 mg | ORAL_TABLET | Freq: Every day | ORAL | 1 refills | Status: DC

## 2021-02-13 ENCOUNTER — Other Ambulatory Visit: Payer: Self-pay

## 2021-02-17 ENCOUNTER — Other Ambulatory Visit: Payer: Self-pay

## 2021-02-20 ENCOUNTER — Other Ambulatory Visit: Payer: Self-pay

## 2021-02-20 ENCOUNTER — Ambulatory Visit: Payer: Self-pay | Attending: Internal Medicine | Admitting: Internal Medicine

## 2021-02-20 DIAGNOSIS — E1169 Type 2 diabetes mellitus with other specified complication: Secondary | ICD-10-CM

## 2021-02-20 DIAGNOSIS — I10 Essential (primary) hypertension: Secondary | ICD-10-CM

## 2021-02-20 LAB — POCT GLYCOSYLATED HEMOGLOBIN (HGB A1C): HbA1c, POC (controlled diabetic range): 11.7 % — AB (ref 0.0–7.0)

## 2021-02-20 LAB — GLUCOSE, POCT (MANUAL RESULT ENTRY): POC Glucose: 317 mg/dl — AB (ref 70–99)

## 2021-02-20 MED ORDER — METFORMIN HCL 500 MG PO TABS
500.0000 mg | ORAL_TABLET | Freq: Every day | ORAL | 1 refills | Status: DC
  Filled 2021-02-20: qty 30, 30d supply, fill #0
  Filled 2021-11-12: qty 90, 90d supply, fill #0

## 2021-02-20 MED ORDER — LISINOPRIL 10 MG PO TABS
10.0000 mg | ORAL_TABLET | Freq: Every day | ORAL | 1 refills | Status: DC
  Filled 2021-02-20: qty 30, 30d supply, fill #0

## 2021-02-20 MED ORDER — TRULICITY 1.5 MG/0.5ML ~~LOC~~ SOAJ
1.5000 mg | SUBCUTANEOUS | 6 refills | Status: DC
  Filled 2021-02-20 – 2021-06-04 (×3): qty 2, 28d supply, fill #0
  Filled 2021-11-12: qty 2, 28d supply, fill #1

## 2021-02-20 NOTE — Progress Notes (Signed)
Virtual Visit via Telephone Note Patient was here as an in person but had to leave before I got to see her because she had to go to work.  She requested to be changed to a telephone visit. I connected with Laura Ashley on 02/20/2021 at 5:12 PM by telephone and verified that I am speaking with the correct person using two identifiers  Location: Patient: home Provider: office  Participants: Myself Patient CMA: [name]    I discussed the limitations, risks, security and privacy concerns of performing an evaluation and management service by telephone and the availability of in person appointments. I also discussed with the patient that there may be a patient responsible charge related to this service. The patient expressed understanding and agreed to proceed.   History of Present Illness: Patient with history of DM type II microalbumin, HTN, obesity, microalbuminuria, PCOS.  DIABETES TYPE 2 Last A1C:   Results for orders placed or performed in visit on 02/20/21  POCT glucose (manual entry)  Result Value Ref Range   POC Glucose 317 (A) 70 - 99 mg/dl   A1C11.7 Med Adherence:  [x]  Yes - Suppose to be on Lantus 26 units, Metformin and Trulicity.  She was out of Lantus for mths but restarted in Sept.  Taking Metformin 500mg  consistently and Trulicity. Medication side effects:  []  Yes    [x]  No Home Monitoring?  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  Walks 30 mins 2x/wk outside of work Hypoglycemic episodes?: []  Yes    []  No Numbness of the feet? []  Yes    []  No Retinopathy hx? []  Yes    []  No Last eye exam: over due.  Plans to go to [place] Comments:   HYPERTENSION Currently taking: see medication list.  She is on lisinopril 5 mg daily. Med Adherence: [x]  Yes    []  No Medication side effects:  []  Yes    [x]  No Adherence with salt restriction: [x]  Yes    []  No Home Monitoring?: []  Yes    [x]  No Monitoring Frequency:  Home BP results range:  SOB? []  Yes    [x]  No Chest Pain?: []  Yes    [x]  No Leg swelling?: []  Yes    [x]  No Headaches?: []  Yes    [x]  No Dizziness? []  Yes    [x]  No Comments:    Outpatient Encounter Medications as of 02/20/2021  Medication Sig   Blood Glucose Monitoring Suppl (TRUE METRIX METER) w/Device KIT Use as directed   Dulaglutide (TRULICITY) 4.94 WH/6.7RF SOPN Inject 0.75 mg into the skin once a week.   glucose blood (TRUE METRIX BLOOD GLUCOSE TEST) test strip Use as instructed   insulin glargine (LANTUS SOLOSTAR) 100 UNIT/ML Solostar Pen Inject 26 Units into the skin daily.   Insulin Pen Needle (TECHLITE PEN NEEDLES) 31G X 5 MM MISC use as directed   lisinopril (ZESTRIL) 5 MG tablet Take 1 tablet (5 mg total) by mouth daily.   metFORMIN (GLUCOPHAGE) 500 MG tablet Take 1 tablet (500 mg total) by mouth daily with breakfast.   TRUEplus Lancets 28G MISC Use as directed   famotidine (PEPCID) 20 MG tablet Take 1 tablet (20 mg total) by mouth 2 (two) times daily.   No facility-administered encounter medications on file as of  02/20/2021.      Observations/Objective: Vitals:   02/20/21 1424  BP: (!) 145/85  Pulse: 82  Height: $Remove'5\' 4"'hrTorrO$  (1.626 m)  Weight: 251 lb 12.8 oz (114.2 kg)  SpO2: 97%  BMI (Calculated): 43.2     Assessment and Plan: 1. Type 2 diabetes mellitus with morbid obesity (HCC) Not at goal. Recommend increasing Trulicity to 1.5 mg once a week.  Patient is agreeable to this.  Follow-up with clinical pharmacist in 1 month with blood sugar log for review.  Went over blood sugar goals which is 90-130 before meals and less than 182 hours after meals. Continue trying to eat healthy.  Challenged her to increase exercise outside of work to 3 times a week for 30 minutes. Encouraged her to get her eye exam done as she has planned as soon as  possible. - POCT glucose (manual entry) - POCT glycosylated hemoglobin (Hb A1C) - Microalbumin / creatinine urine ratio - CBC - Comprehensive metabolic panel - Lipid panel  2. Essential hypertension Not at goal.  Recommend increasing lisinopril to 10 mg daily.  Advised that should she plan pregnancy or becomes pregnant, she should let us know as we will need to change the lisinopril to a different blood pressure medication as the lisinopril is not safe in pregnancy.  She expressed understanding. - CBC - Comprehensive metabolic panel - Lipid panel   Follow Up Instructions: 4 mths Give appointment with clinical pharmacist in 1 month for diabetes recheck.   I discussed the assessment and treatment plan with the patient. The patient was provided an opportunity to ask questions and all were answered. The patient agreed with the plan and demonstrated an understanding of the instructions.   The patient was advised to call back or seek an in-person evaluation if the symptoms worsen or if the condition fails to improve as anticipated.  I  Spent 8 minutes on this telephone encounter  Laura Plumber, MD

## 2021-02-21 ENCOUNTER — Encounter: Payer: Self-pay | Admitting: Internal Medicine

## 2021-02-21 DIAGNOSIS — E1169 Type 2 diabetes mellitus with other specified complication: Secondary | ICD-10-CM | POA: Insufficient documentation

## 2021-02-21 DIAGNOSIS — E785 Hyperlipidemia, unspecified: Secondary | ICD-10-CM | POA: Insufficient documentation

## 2021-02-21 LAB — COMPREHENSIVE METABOLIC PANEL
ALT: 12 IU/L (ref 0–32)
AST: 10 IU/L (ref 0–40)
Albumin/Globulin Ratio: 1.4 (ref 1.2–2.2)
Albumin: 4.2 g/dL (ref 3.9–5.0)
Alkaline Phosphatase: 106 IU/L (ref 44–121)
BUN/Creatinine Ratio: 14 (ref 9–23)
BUN: 9 mg/dL (ref 6–20)
Bilirubin Total: 0.4 mg/dL (ref 0.0–1.2)
CO2: 24 mmol/L (ref 20–29)
Calcium: 10 mg/dL (ref 8.7–10.2)
Chloride: 100 mmol/L (ref 96–106)
Creatinine, Ser: 0.65 mg/dL (ref 0.57–1.00)
Globulin, Total: 3.1 g/dL (ref 1.5–4.5)
Glucose: 293 mg/dL — ABNORMAL HIGH (ref 70–99)
Potassium: 4.7 mmol/L (ref 3.5–5.2)
Sodium: 136 mmol/L (ref 134–144)
Total Protein: 7.3 g/dL (ref 6.0–8.5)
eGFR: 123 mL/min/{1.73_m2} (ref >59)

## 2021-02-21 LAB — CBC
Hematocrit: 44.9 % (ref 34.0–46.6)
Hemoglobin: 15.5 g/dL (ref 11.1–15.9)
MCH: 30.5 pg (ref 26.6–33.0)
MCHC: 34.5 g/dL (ref 31.5–35.7)
MCV: 88 fL (ref 79–97)
Platelets: 431 10*3/uL (ref 150–450)
RBC: 5.08 x10E6/uL (ref 3.77–5.28)
RDW: 11.5 % — ABNORMAL LOW (ref 11.7–15.4)
WBC: 6.4 10*3/uL (ref 3.4–10.8)

## 2021-02-21 LAB — LIPID PANEL
Chol/HDL Ratio: 3.5 ratio (ref 0.0–4.4)
Cholesterol, Total: 190 mg/dL (ref 100–199)
HDL: 55 mg/dL (ref >39)
LDL Chol Calc (NIH): 117 mg/dL — ABNORMAL HIGH (ref 0–99)
Triglycerides: 98 mg/dL (ref 0–149)
VLDL Cholesterol Cal: 18 mg/dL (ref 5–40)

## 2021-02-21 LAB — MICROALBUMIN / CREATININE URINE RATIO
Creatinine, Urine: 66.6 mg/dL
Microalb/Creat Ratio: 10 mg/g creat (ref 0–29)
Microalbumin, Urine: 6.7 ug/mL

## 2021-02-21 NOTE — Progress Notes (Signed)
Blood cell counts are normal. Kidney and liver function test look okay. Cholesterol level elevated at 117 with goal being less than 70.  Healthy eating habits and regular exercise will help to lower the cholesterol.  We will recheck cholesterol again in several months.  If still elevated at that time we will need to put her on cholesterol-lowering medication.

## 2021-02-23 ENCOUNTER — Other Ambulatory Visit: Payer: Self-pay

## 2021-02-24 ENCOUNTER — Telehealth: Payer: Self-pay

## 2021-02-24 ENCOUNTER — Other Ambulatory Visit: Payer: Self-pay

## 2021-02-24 NOTE — Telephone Encounter (Signed)
Contacted pt to go over lab results pt is aware and doesn't have any questions or concerns 

## 2021-03-02 ENCOUNTER — Other Ambulatory Visit: Payer: Self-pay

## 2021-03-23 ENCOUNTER — Emergency Department (HOSPITAL_COMMUNITY)
Admission: EM | Admit: 2021-03-23 | Discharge: 2021-03-23 | Disposition: A | Discharge status: Home / Self Care | Payer: Self-pay | Attending: Emergency Medicine | Admitting: Emergency Medicine

## 2021-03-23 ENCOUNTER — Other Ambulatory Visit: Payer: Self-pay

## 2021-03-23 ENCOUNTER — Encounter (HOSPITAL_COMMUNITY): Payer: Self-pay

## 2021-03-23 ENCOUNTER — Ambulatory Visit (HOSPITAL_COMMUNITY)
Admission: EM | Admit: 2021-03-23 | Discharge: 2021-03-23 | Disposition: A | Discharge status: Home / Self Care | Payer: Self-pay | Attending: Internal Medicine | Admitting: Internal Medicine

## 2021-03-23 DIAGNOSIS — R350 Frequency of micturition: Secondary | ICD-10-CM | POA: Insufficient documentation

## 2021-03-23 DIAGNOSIS — N12 Tubulo-interstitial nephritis, not specified as acute or chronic: Secondary | ICD-10-CM | POA: Insufficient documentation

## 2021-03-23 DIAGNOSIS — R300 Dysuria: Secondary | ICD-10-CM | POA: Insufficient documentation

## 2021-03-23 DIAGNOSIS — Z5321 Procedure and treatment not carried out due to patient leaving prior to being seen by health care provider: Secondary | ICD-10-CM | POA: Insufficient documentation

## 2021-03-23 DIAGNOSIS — N3001 Acute cystitis with hematuria: Secondary | ICD-10-CM | POA: Insufficient documentation

## 2021-03-23 DIAGNOSIS — M549 Dorsalgia, unspecified: Secondary | ICD-10-CM | POA: Insufficient documentation

## 2021-03-23 HISTORY — DX: Essential (primary) hypertension: I10

## 2021-03-23 LAB — URINALYSIS, ROUTINE W REFLEX MICROSCOPIC
Bilirubin Urine: NEGATIVE
Glucose, UA: >=500 mg/dL — AB
Ketones, ur: NEGATIVE mg/dL
Nitrite: POSITIVE — AB
Protein, ur: 100 mg/dL — AB
Specific Gravity, Urine: 1.039 — ABNORMAL HIGH (ref 1.005–1.030)
WBC, UA: >50 WBC/hpf — ABNORMAL HIGH (ref 0–5)
pH: 5 (ref 5.0–8.0)

## 2021-03-23 LAB — COMPREHENSIVE METABOLIC PANEL
ALT: 18 U/L (ref 0–44)
AST: 14 U/L — ABNORMAL LOW (ref 15–41)
Albumin: 4.2 g/dL (ref 3.5–5.0)
Alkaline Phosphatase: 87 U/L (ref 38–126)
Anion gap: 9 (ref 5–15)
BUN: 10 mg/dL (ref 6–20)
CO2: 24 mmol/L (ref 22–32)
Calcium: 9.5 mg/dL (ref 8.9–10.3)
Chloride: 98 mmol/L (ref 98–111)
Creatinine, Ser: 0.67 mg/dL (ref 0.44–1.00)
GFR, Estimated: >60 mL/min (ref >60)
Glucose, Bld: 331 mg/dL — ABNORMAL HIGH (ref 70–99)
Potassium: 4.3 mmol/L (ref 3.5–5.1)
Sodium: 131 mmol/L — ABNORMAL LOW (ref 135–145)
Total Bilirubin: 1.5 mg/dL — ABNORMAL HIGH (ref 0.3–1.2)
Total Protein: 7.8 g/dL (ref 6.5–8.1)

## 2021-03-23 LAB — CBC WITH DIFFERENTIAL/PLATELET
Abs Immature Granulocytes: 0.04 10*3/uL (ref 0.00–0.07)
Basophils Absolute: 0 10*3/uL (ref 0.0–0.1)
Basophils Relative: 0 %
Eosinophils Absolute: 0 10*3/uL (ref 0.0–0.5)
Eosinophils Relative: 0 %
HCT: 44.3 % (ref 36.0–46.0)
Hemoglobin: 16 g/dL — ABNORMAL HIGH (ref 12.0–15.0)
Immature Granulocytes: 0 %
Lymphocytes Relative: 12 %
Lymphs Abs: 1.5 10*3/uL (ref 0.7–4.0)
MCH: 30.9 pg (ref 26.0–34.0)
MCHC: 36.1 g/dL — ABNORMAL HIGH (ref 30.0–36.0)
MCV: 85.5 fL (ref 80.0–100.0)
Monocytes Absolute: 0.8 10*3/uL (ref 0.1–1.0)
Monocytes Relative: 6 %
Neutro Abs: 9.8 10*3/uL — ABNORMAL HIGH (ref 1.7–7.7)
Neutrophils Relative %: 82 %
Platelets: 373 10*3/uL (ref 150–400)
RBC: 5.18 MIL/uL — ABNORMAL HIGH (ref 3.87–5.11)
RDW: 11.2 % — ABNORMAL LOW (ref 11.5–15.5)
WBC: 12.1 10*3/uL — ABNORMAL HIGH (ref 4.0–10.5)
nRBC: 0 % (ref 0.0–0.2)

## 2021-03-23 LAB — POCT URINALYSIS DIPSTICK, ED / UC
Bilirubin Urine: NEGATIVE
Glucose, UA: 500 mg/dL — AB
Ketones, ur: >=160 mg/dL — AB
Nitrite: POSITIVE — AB
Protein, ur: 100 mg/dL — AB
Specific Gravity, Urine: >=1.03 (ref 1.005–1.030)
Urobilinogen, UA: 0.2 mg/dL (ref 0.0–1.0)
pH: 5 (ref 5.0–8.0)

## 2021-03-23 LAB — POC URINE PREG, ED: Preg Test, Ur: NEGATIVE

## 2021-03-23 MED ORDER — CIPROFLOXACIN HCL 500 MG PO TABS: 500.0000 mg | ORAL_TABLET | Freq: Two times a day (BID) | ORAL | 0 refills | Status: AC

## 2021-03-23 NOTE — ED Provider Triage Note (Signed)
Emergency Medicine Provider Triage Evaluation Note  Laura Ashley , a 31 y.o. female  was evaluated in triage.  Pt complains of uti symptoms   Review of Systems  Positive: Back pain  Negative: fever  Physical Exam  BP (!) 155/112 (BP Location: Left Arm)    Pulse 94    Temp 99.2 F (37.3 C) (Oral)    Resp 19    SpO2 96%  Gen:   Awake, no distress   Resp:  Normal effort  MSK:   Moves extremities without difficulty  Other:    Medical Decision Making  Medically screening exam initiated at 10:51 AM.  Appropriate orders placed.  Laura Ashley was informed that the remainder of the evaluation will be completed by another provider, this initial triage assessment does not replace that evaluation, and the importance of remaining in the ED until their evaluation is complete.     Laura Ashley, [place] 03/23/21 1052

## 2021-03-23 NOTE — ED Triage Notes (Signed)
Pt c/o burning on urination with urgency/frequency, and back /lt flank pain x3 days.

## 2021-03-23 NOTE — ED Provider Notes (Addendum)
[facility]    CSN: [id number] Arrival date & time: 03/23/21  1406      History   Chief Complaint Chief Complaint  Patient presents with   Urinary Tract Infection    HPI Laura Ashley is a 31 y.o. female.   Patient presents today with a 3-day history of UTI symptoms.  Reports dysuria, urinary frequency, urinary urgency, left-sided flank pain.  She denies any fever, nausea, vomiting, vaginal symptoms.  She has not tried any over-the-counter medication for symptom management.  She denies any recent antibiotic use.  She does have a history of UTI but has not had episode of this condition in many years.  Denies any history of nephrolithiasis, seeing a urologist, self-catheterization, recent urogenital procedure.  She is diabetic and reports her blood sugars are not adequately controlled; last A1c September 2022 was 12.0%.  She does not take an SGLT2 inhibitor.  Patient went to the ER earlier but did not stay for evaluation.   Past Medical History:  Diagnosis Date   Diabetes mellitus without complication ([place])    Hypertension    Medical history non-contributory    PCOS (polycystic ovarian syndrome)     Patient Active Problem List   Diagnosis Date Noted   Hyperlipidemia associated with type 2 diabetes mellitus ([place]) 02/21/2021   Uncontrolled type 2 diabetes mellitus with microalbuminuria 10/27/2018   Elevated blood pressure reading 09/25/2018   Class 3 severe obesity due to excess calories with serious comorbidity and body mass index (BMI) of 45.0 to 49.9 in adult [facility]) 09/25/2018   History of PCOS 09/25/2018   Morbidly obese ([place]) 02/28/2014   Hypertension 02/28/2014   Dysfunctional uterine bleeding 02/28/2014    Past Surgical History:  Procedure Laterality Date   NO PAST SURGERIES      OB History     Gravida  2   Para      Term      Preterm      AB  2   Living  0      SAB  2   IAB      Ectopic      Multiple      Live Births                Home Medications    Prior to Admission medications   Medication Sig Start Date End Date Taking? Authorizing Provider  ciprofloxacin (CIPRO) 500 MG tablet Take 1 tablet (500 mg total) by mouth 2 (two) times daily for 7 days. 03/23/21 03/30/21 Yes Caliana Spires, Derry Skill, PA-C  Blood Glucose Monitoring Suppl (TRUE METRIX METER) w/Device KIT Use as directed 09/25/18   Ladell Pier, MD  Dulaglutide (TRULICITY) 1.5 ON/6.2XB SOPN Inject 1.5 mg into the skin once a week. 02/20/21   Ladell Pier, MD  famotidine (PEPCID) 20 MG tablet Take 1 tablet (20 mg total) by mouth 2 (two) times daily. 11/19/20 02/17/21  Gildardo Pounds, NP  glucose blood (TRUE METRIX BLOOD GLUCOSE TEST) test strip Use as instructed 11/19/20   Gildardo Pounds, NP  insulin glargine (LANTUS SOLOSTAR) 100 UNIT/ML Solostar Pen Inject 26 Units into the skin daily. 02/12/21   Ladell Pier, MD  Insulin Pen Needle (TECHLITE PEN NEEDLES) 31G X 5 MM MISC use as directed 11/19/20   Gildardo Pounds, NP  lisinopril (ZESTRIL) 10 MG tablet Take 1 tablet (10 mg total) by mouth daily. 02/20/21   Ladell Pier, MD  metFORMIN (GLUCOPHAGE) 500 MG tablet Take  1 tablet (500 mg total) by mouth daily with breakfast. 02/20/21   Ladell Pier, MD  TRUEplus Lancets 28G MISC Use as directed 05/18/19   Camillia Herter, NP    Family History Family History  Problem Relation Age of Onset   Diabetes Father    Diabetes Other    Healthy Mother     Social History Social History   Tobacco Use   Smoking status: Former   Smokeless tobacco: Never  Scientific laboratory technician Use: Never used  Substance Use Topics   Alcohol use: Yes   Drug use: No     Allergies   Penicillins   Review of Systems Review of Systems  Constitutional:  Positive for activity change. Negative for appetite change, fatigue and fever.  Respiratory:  Negative for cough and shortness of breath.   Cardiovascular:  Negative for chest pain.  Gastrointestinal:  Positive for  abdominal pain. Negative for diarrhea, nausea and vomiting.  Genitourinary:  Positive for dysuria, flank pain, frequency and urgency. Negative for pelvic pain, vaginal bleeding, vaginal discharge and vaginal pain.  Musculoskeletal:  Positive for back pain. Negative for arthralgias and myalgias.  Neurological:  Negative for dizziness, light-headedness and headaches.    Physical Exam Triage Vital Signs ED Triage Vitals [03/23/21 1549]  Enc Vitals Group     BP (!) 147/102     Pulse Rate 98     Resp 18     Temp 99 F (37.2 C)     Temp Source Oral     SpO2 98 %     Weight      Height      Head Circumference      Peak Flow      Pain Score 8     Pain Loc      Pain Edu?      Excl. in [place]?    No data found.  Updated Vital Signs BP (!) 147/102 (BP Location: Right Arm)    Pulse 98    Temp 99 F (37.2 C) (Oral)    Resp 18    SpO2 98%   Visual Acuity Right Eye Distance:   Left Eye Distance:   Bilateral Distance:    Right Eye Near:   Left Eye Near:    Bilateral Near:     Physical Exam Vitals reviewed.  Constitutional:      General: She is awake. She is not in acute distress.    Appearance: Normal appearance. She is well-developed. She is not ill-appearing.     Comments: Very pleasant female appears stated age in no acute distress sitting comfortably in exam room  HENT:     Head: Normocephalic and atraumatic.  Cardiovascular:     Rate and Rhythm: Normal rate and regular rhythm.     Heart sounds: Normal heart sounds, S1 normal and S2 normal. No murmur heard. Pulmonary:     Effort: Pulmonary effort is normal.     Breath sounds: Normal breath sounds. No wheezing, rhonchi or rales.     Comments: Clear to auscultation bilaterally Abdominal:     General: Bowel sounds are normal.     Palpations: Abdomen is soft.     Tenderness: There is abdominal tenderness in the right lower quadrant, suprapubic area and left lower quadrant. There is left CVA tenderness. There is no right CVA  tenderness, guarding or rebound.     Comments: Tenderness palpation throughout lower abdomen.  No evidence of acute abdomen on physical exam.  Left-sided CVA tenderness.  Psychiatric:        Behavior: Behavior is cooperative.     UC Treatments / Results  Labs (all labs ordered are listed, but only abnormal results are displayed) Labs Reviewed  POCT URINALYSIS DIPSTICK, ED / UC - Abnormal; Notable for the following components:      Result Value   Glucose, UA 500 (*)    Ketones, ur >=160 (*)    Hgb urine dipstick LARGE (*)    Protein, ur 100 (*)    Nitrite POSITIVE (*)    Leukocytes,Ua TRACE (*)    All other components within normal limits  URINE CULTURE  CBC WITH DIFFERENTIAL/PLATELET  COMPREHENSIVE METABOLIC PANEL  POC URINE PREG, ED    EKG   Radiology No results found.  Procedures Procedures (including critical care time)  Medications Ordered in UC Medications - No data to display  Initial Impression / Assessment and Plan / UC Course  I have reviewed the triage vital signs and the nursing notes.  Pertinent labs & imaging results that were available during my care of the patient were reviewed by me and considered in my medical decision making (see chart for details).     UA showed significant evidence of infection.  Urine culture was obtained and we discussed potential need to change antibiotics based on susceptibilities identified on culture.  Concern for pyelonephritis given clinical presentation.  Urine pregnancy was negative.  We will start ciprofloxacin 500 mg twice daily for 7 days.  We are unable to give Rocephin in clinic due to penicillin allergy.  CBC and CMP were obtained today and discussed that she has significant leukocytosis or abnormal kidney function she would need to go to the ER.  Discussed that if symptoms or not improving within 24 to 48 hours she should go to the emergency room for further evaluation to which she and significant other expressed  understanding.  She was given contact information for urologist and encouraged to follow-up if symptoms are improving but have not yet resolved within a few days.  Discussed at length alarm symptoms that warrant emergent evaluation including fever, worsening pain, nausea/vomiting interfering with oral intake.  Strict return precautions given to which she expressed understanding.  Work excuse note provided.  Final Clinical Impressions(s) / UC Diagnoses   Final diagnoses:  Acute cystitis with hematuria  Pyelonephritis  Dysuria  Urinary frequency     Discharge Instructions      I am concerned that you have an infection that is spreading to your kidney.  Start ciprofloxacin 500 mg twice daily.  If you do not have significant improvement of symptoms within 24 hours please go to the emergency room.  If anything worsens and you develop severe abdominal pain, fever, nausea, vomiting, weakness you need to go to the ER immediately.  I will contact you if your lab work is abnormal.  Please follow-up with urology as soon as possible assuming improvement with antibiotics; please call to set an appointment.  Make sure you drink plenty of fluid.       ED Prescriptions     Medication Sig Dispense Auth. Provider   ciprofloxacin (CIPRO) 500 MG tablet Take 1 tablet (500 mg total) by mouth 2 (two) times daily for 7 days. 14 tablet Travonte Byard, Derry Skill, PA-C      PDMP not reviewed this encounter.   Terrilee Croak, PA-C 03/23/21 1609    Dorthea Maina, Derry Skill, PA-C 03/23/21 1609

## 2021-03-23 NOTE — Discharge Instructions (Addendum)
I am concerned that you have an infection that is spreading to your kidney.  Start ciprofloxacin 500 mg twice daily.  If you do not have significant improvement of symptoms within 24 hours please go to the emergency room.  If anything worsens and you develop severe abdominal pain, fever, nausea, vomiting, weakness you need to go to the ER immediately.  I will contact you if your lab work is abnormal.  Please follow-up with urology as soon as possible assuming improvement with antibiotics; please call to set an appointment.  Make sure you drink plenty of fluid.

## 2021-03-23 NOTE — ED Triage Notes (Signed)
Patient c/o left lower back pain, urinary frequency , and dysuria x 3 days.

## 2021-03-25 LAB — URINE CULTURE: Culture: >=100000 — AB

## 2021-04-04 NOTE — Progress Notes (Unsigned)
° ° °  S:    PCP: Dr. Laural Benes  Patient presents for diabetes evaluation, education, and management. Patient was referred and last seen by Primary Care Provider on 02/20/21. Trulicity and lisinopril were increased at that time.   Today, *** -ever been on higher dose of metformin? Looks like had diarrhea with 1000 mg in the past? Was not XR formulation. Try 500 XR BID?  -tolerating trulicity dose increase?  -sees dr Laural Benes 07/10/21 next -f/u CPP in 1 month  Patient reports Diabetes was diagnosed in ***.   Family/Social History: Never smoker. DM in father.   Insurance coverage/medication affordability: self pay  Medication adherence reported ***.   Current diabetes medications include: metformin 500 mg daily, Trulicity 1.5 mg weekly, Lantus 26 units daily Current hypertension medications include: lisinopril 10 mg daily  Patient {Actions; denies-reports:120008} hypoglycemic events.  Patient reported dietary habits: Eats *** meals/day Breakfast:*** Lunch:*** Dinner:*** Snacks:*** Drinks:***  Patient-reported exercise habits: ***   Patient {Actions; denies-reports:120008} nocturia (nighttime urination).  Patient {Actions; denies-reports:120008} neuropathy (nerve pain). Patient {Actions; denies-reports:120008} visual changes. Patient {Actions; denies-reports:120008} self foot exams.     O:  POCT BG: ***  Lab Results  Component Value Date   HGBA1C 11.7 (A) 02/20/2021   There were no vitals filed for this visit.  Lipid Panel     Component Value Date/Time   CHOL 190 02/20/2021 1524   TRIG 98 02/20/2021 1524   HDL 55 02/20/2021 1524   CHOLHDL 3.5 02/20/2021 1524   LDLCALC 117 (H) 02/20/2021 1524   Home fasting blood sugars: ***  2 hour post-meal/random blood sugars: ***.  Clinical Atherosclerotic Cardiovascular Disease (ASCVD): No   A/P: Diabetes longstanding currently uncontrolled with most recent A1c 11.7 on 02/20/21. Patient is able to verbalize appropriate  hypoglycemia management plan. Medication adherence appears ***. Control is suboptimal due to ***. -*** -Extensively discussed pathophysiology of diabetes, recommended lifestyle interventions, dietary effects on blood sugar control -Counseled on s/sx of and management of hypoglycemia -Next A1C anticipated March 2023.    Written patient instructions provided.  Total time in face to face counseling *** minutes.   Follow up Pharmacist/PCP*** Clinic Visit in ***.   Patient seen with ***

## 2021-04-06 ENCOUNTER — Ambulatory Visit: Payer: Self-pay | Admitting: Pharmacist

## 2021-04-07 ENCOUNTER — Other Ambulatory Visit: Payer: Self-pay

## 2021-05-27 ENCOUNTER — Other Ambulatory Visit: Payer: Self-pay

## 2021-05-29 ENCOUNTER — Other Ambulatory Visit: Payer: Self-pay

## 2021-06-03 ENCOUNTER — Other Ambulatory Visit: Payer: Self-pay

## 2021-06-04 ENCOUNTER — Other Ambulatory Visit: Payer: Self-pay

## 2021-06-05 ENCOUNTER — Other Ambulatory Visit: Payer: Self-pay

## 2021-07-03 ENCOUNTER — Ambulatory Visit: Payer: Self-pay | Admitting: Internal Medicine

## 2021-07-10 ENCOUNTER — Other Ambulatory Visit: Payer: Self-pay

## 2021-07-10 ENCOUNTER — Other Ambulatory Visit: Payer: Self-pay | Admitting: Pharmacist

## 2021-07-10 ENCOUNTER — Ambulatory Visit: Payer: Self-pay | Attending: Internal Medicine | Admitting: Internal Medicine

## 2021-07-10 ENCOUNTER — Ambulatory Visit: Payer: Self-pay | Admitting: Internal Medicine

## 2021-07-10 DIAGNOSIS — E669 Obesity, unspecified: Secondary | ICD-10-CM

## 2021-07-10 DIAGNOSIS — E785 Hyperlipidemia, unspecified: Secondary | ICD-10-CM

## 2021-07-10 DIAGNOSIS — I10 Essential (primary) hypertension: Secondary | ICD-10-CM

## 2021-07-10 DIAGNOSIS — E1169 Type 2 diabetes mellitus with other specified complication: Secondary | ICD-10-CM

## 2021-07-10 MED ORDER — BASAGLAR KWIKPEN 100 UNIT/ML ~~LOC~~ SOPN
30.0000 [IU] | PEN_INJECTOR | Freq: Every day | SUBCUTANEOUS | 2 refills | Status: DC
  Filled 2021-07-10: qty 9, 30d supply, fill #0

## 2021-07-10 MED ORDER — LANTUS SOLOSTAR 100 UNIT/ML ~~LOC~~ SOPN
30.0000 [IU] | PEN_INJECTOR | Freq: Every day | SUBCUTANEOUS | 6 refills | Status: DC
  Filled 2021-07-10: qty 3, 10d supply, fill #0

## 2021-07-10 NOTE — Progress Notes (Signed)
Patient ID: Laura Ashley, female   DOB: February 25, 1991, 31 y.o.   MRN: [id number] ?Virtual Visit via Telephone Note ? ?I connected with Laura Ashley on 07/10/2021 at 3:31 PM by telephone and verified that I am speaking with the correct person using two identifiers ? ?Location: ?Patient: home ?Provider: office ? ?Participants: ?Myself ?Patient ?  ?I discussed the limitations, risks, security and privacy concerns of performing an evaluation and management service by telephone and the availability of in person appointments. I also discussed with the patient that there may be a patient responsible charge related to this service. The patient expressed understanding and agreed to proceed. ? ? ?History of Present Illness: ?Patient with history of DM type II microalbumin, HTN, obesity, microalbuminuria, PCOS, HL.  Last eval 02/2021.  Today's visit is for chronic disease management. ? ?DM: ?Last A1C 11.7 ?Checking BS BID - before BF range 215-220 and same before dinner. ?Feels she is doing well with her eating habits, smaller portions. 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  Walks in neighborhood 2-3 x a wk for 25 mins ?Confirms compliance with Trulicity 1.5 mg, Lantus 26 units daily and Metformin 500 mg daily (higher doses cause diarrhea and nausa).   ?No eye exam as yet.  Plans to get done after graduation in 2 wks ?LDL on last visit was 117.  We plan to recheck to see if any improvement ? ?HTN:  Lisinopril increased to 10 mg daily on last visit.  Taking it consistently and limits salt in foods.  No device to check blood pressure. ? ? ?Outpatient Encounter Medications as of 07/10/2021  ?Medication Sig  ? Blood Glucose Monitoring Suppl (TRUE METRIX METER) w/Device KIT Use as directed  ? Dulaglutide (TRULICITY) 1.5 LG/9.2JJ SOPN Inject 1.5 mg into the skin once a week.  ? famotidine (PEPCID) 20 MG tablet Take 1 tablet (20 mg total) by mouth 2 (two) times daily.  ? glucose blood (TRUE METRIX BLOOD GLUCOSE TEST) test  strip Use as instructed  ? insulin glargine (LANTUS SOLOSTAR) 100 UNIT/ML Solostar Pen Inject 26 Units into the skin daily.  ? Insulin Pen Needle (TECHLITE PEN NEEDLES) 31G X 5 MM MISC use as directed  ? lisinopril (ZESTRIL) 10 MG tablet Take 1 tablet (10 mg total) by mouth daily.  ? metFORMIN (GLUCOPHAGE) 500 MG tablet Take 1 tablet (500 mg total) by mouth daily with breakfast.  ? TRUEplus Lancets 28G MISC Use as directed  ? ?No facility-administered encounter medications on file as of 07/10/2021.  ? ? ?  ?Observations/Objective: No direct observation done as this was a telephone visit. ?  Chemistry   ?   ?Component Value Date/Time  ? NA 131 (L) 03/23/2021 1602  ? NA 136 02/20/2021 1524  ? K 4.3 03/23/2021 1602  ? CL 98 03/23/2021 1602  ? CO2 24 03/23/2021 1602  ? BUN 10 03/23/2021 1602  ? BUN 9 02/20/2021 1524  ? CREATININE 0.67 03/23/2021 1602  ? CREATININE 0.86 02/28/2014 1701  ?    ?Component Value Date/Time  ? CALCIUM 9.5 03/23/2021 1602  ? ALKPHOS 87 03/23/2021 1602  ? AST 14 (L) 03/23/2021 1602  ? ALT 18 03/23/2021 1602  ? BILITOT 1.5 (H) 03/23/2021 1602  ? BILITOT 0.4 02/20/2021 1524  ?  ? ?Lab Results  ?Component Value Date  ? WBC 12.1 (H) 03/23/2021  ? HGB 16.0 (H) 03/23/2021  ? HCT 44.3 03/23/2021  ? MCV 85.5 03/23/2021  ? PLT 373 03/23/2021  ?' ?  Lab Results  ?Component Value Date  ? CHOL 190 02/20/2021  ? HDL 55 02/20/2021  ? LDLCALC 117 (H) 02/20/2021  ? TRIG 98 02/20/2021  ? CHOLHDL 3.5 02/20/2021  ? ? ? ?Assessment and Plan: ?1. Type 2 diabetes mellitus with obesity ([place]) ?Blood sugars not at goal.  Recommend increasing glargine insulin to 30 units daily.  Continue metformin 500 mg daily and Trulicity 1.5 mg once a week.  Continue to check blood sugars twice a day before meals with goal being 90-130.  Follow-up with our clinical pharmacist in 1 month for recheck.  Reminded her to try to get her eye exam done as soon as possible. ?Encouraged her to continue trying to eat healthy.  Commended her on  moving more. ?- Hemoglobin A1c; Future ? ?2. Essential hypertension ?Continue lisinopril. ? ?3. Hyperlipidemia associated with type 2 diabetes mellitus ([place]) ?She will come fasting to lab next wk for recheck ?- Lipid panel; Future ? ? ?Follow Up Instructions: ?4 mths ?Appt with clinical pharmacist in 1 mth for BS recheck ?  ?I discussed the assessment and treatment plan with the patient. The patient was provided an opportunity to ask questions and all were answered. The patient agreed with the plan and demonstrated an understanding of the instructions. ?  ?The patient was advised to call back or seek an in-person evaluation if the symptoms worsen or if the condition fails to improve as anticipated. ? ?I  Spent 10 minutes on this telephone encounter ? ?This note has been created with Surveyor, quantity. Any transcriptional errors are unintentional. ? ?Karle Plumber, MD ? ?

## 2021-07-13 ENCOUNTER — Other Ambulatory Visit: Payer: Self-pay

## 2021-07-20 ENCOUNTER — Other Ambulatory Visit: Payer: Self-pay

## 2021-08-17 ENCOUNTER — Ambulatory Visit: Payer: Self-pay | Admitting: Pharmacist

## 2021-11-12 ENCOUNTER — Other Ambulatory Visit: Payer: Self-pay

## 2021-11-17 ENCOUNTER — Encounter: Payer: Self-pay | Admitting: Internal Medicine

## 2021-11-17 ENCOUNTER — Ambulatory Visit: Payer: Self-pay | Attending: Internal Medicine | Admitting: Internal Medicine

## 2021-11-17 DIAGNOSIS — I152 Hypertension secondary to endocrine disorders: Secondary | ICD-10-CM

## 2021-11-17 DIAGNOSIS — Z6841 Body Mass Index (BMI) 40.0 and over, adult: Secondary | ICD-10-CM

## 2021-11-17 DIAGNOSIS — Z794 Long term (current) use of insulin: Secondary | ICD-10-CM

## 2021-11-17 DIAGNOSIS — E1159 Type 2 diabetes mellitus with other circulatory complications: Secondary | ICD-10-CM

## 2021-11-17 DIAGNOSIS — E1169 Type 2 diabetes mellitus with other specified complication: Secondary | ICD-10-CM

## 2021-11-17 NOTE — Progress Notes (Signed)
Patient ID: Laura Ashley, female   DOB: September 13, 1990, 31 y.o.   MRN: [id number] Virtual Visit via Telephone Note  I connected with Laura Ashley on 11/17/2021 at 2:47 PM by telephone and verified that I am speaking with the correct person using two identifiers This was suppose to be an in-person visit but I was told pt called and changed to telephone visit.  Location: Patient: home Provider: office  Participants: Myself Patient   I discussed the limitations, risks, security and privacy concerns of performing an evaluation and management service by telephone and the availability of in person appointments. I also discussed with the patient that there may be a patient responsible charge related to this service. The patient expressed understanding and agreed to proceed.   History of Present Illness: Patient with history of DM type II microalbumin, HTN, obesity, microalbuminuria, PCOS.  DM:  Reports compliance with Lantus 30 units daily, Trulicity 1.5 mg Q wk and Metformin 500 mg daily. Checks BS before BF and bedtime.  BS in a.m in the low 200s and bedtime in mid 200s.  Reports #s better than before.  Reports dec appetite with Trulicity.  Current wgh is 242 lbs.  Prior wgh was 275. Feels she eats healthier.  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  After a telephone visit in April of this year, she was supposed to come to the lab to have A1c done but never did. Takes Lisinopril for BP No device to check BP.  Seen at HD 2 wks ago and reading was 122/80   Outpatient Encounter Medications as of 11/17/2021  Medication Sig   Blood Glucose Monitoring Suppl (TRUE METRIX METER) w/Device KIT Use as directed   Dulaglutide (TRULICITY) 1.5 QM/5.7QI SOPN Inject 1.5 mg into the skin once a week.   famotidine (PEPCID) 20 MG tablet Take 1 tablet (20 mg total) by mouth 2 (two) times daily.   glucose blood (TRUE METRIX BLOOD GLUCOSE TEST) test strip Use as instructed    Insulin Glargine (BASAGLAR KWIKPEN) 100 UNIT/ML Inject 30 Units into the skin daily.   Insulin Pen Needle (TECHLITE PEN NEEDLES) 31G X 5 MM MISC use as directed   lisinopril (ZESTRIL) 10 MG tablet Take 1 tablet (10 mg total) by mouth daily.   metFORMIN (GLUCOPHAGE) 500 MG tablet Take 1 tablet (500 mg total) by mouth daily with breakfast.   TRUEplus Lancets 28G MISC Use as directed   No facility-administered encounter medications on file as of 11/17/2021.      Observations/Objective: Wt Readings from Last 3 Encounters:  11/17/21 242 lb (109.8 kg)  03/23/21 242 lb (109.8 kg)  02/20/21 251 lb 12.8 oz (114.2 kg)   Body mass index is 41.54 kg/m.  No direct observation done as this was a telephone visit.  Assessment and Plan: 1. Type 2 diabetes mellitus with morbid obesity ([place]) Reported blood sugar readings are not at goal. I went over goal for blood sugars before meals being 90-130 and 2 hours after meals being less than 188.  Recommend increase Lantus insulin to 35 units daily.  Advised to record blood sugar readings.  I will have her follow-up with the clinical pharmacist in 1 month.  Encouraged her to come to the lab to have the A1c drawn that was ordered since last visit.  Strongly advised that our next visit be in person. -Discussed on encourage healthy eating habits.  2. Hypertension associated with type 2  diabetes mellitus (HCC) Continue lisinopril.  Reported blood pressure reading 2 weeks ago at the health department was at goal.   Follow Up Instructions: F/u with Lurena Joiner in 1 mth and bring BS readings F/u with me in 3 mths  I discussed the assessment and treatment plan with the patient. The patient was provided an opportunity to ask questions and all were answered. The patient agreed with the plan and demonstrated an understanding of the instructions.   The patient was advised to call back or seek an in-person evaluation if the symptoms worsen or if the condition fails to improve  as anticipated.  I  Spent 8 minutes on this telephone encounter  This note has been created with Surveyor, quantity. Any transcriptional errors are unintentional.  Karle Plumber, MD

## 2022-05-26 ENCOUNTER — Ambulatory Visit: Payer: Self-pay | Attending: Physician Assistant | Admitting: Physician Assistant

## 2022-05-26 ENCOUNTER — Encounter: Payer: Self-pay | Admitting: Physician Assistant

## 2022-05-26 ENCOUNTER — Other Ambulatory Visit: Payer: Self-pay

## 2022-05-26 VITALS — BP 138/84 | HR 87 | Wt 247.8 lb

## 2022-05-26 DIAGNOSIS — Z91199 Patient's noncompliance with other medical treatment and regimen due to unspecified reason: Secondary | ICD-10-CM

## 2022-05-26 DIAGNOSIS — I152 Hypertension secondary to endocrine disorders: Secondary | ICD-10-CM

## 2022-05-26 DIAGNOSIS — E1169 Type 2 diabetes mellitus with other specified complication: Secondary | ICD-10-CM

## 2022-05-26 DIAGNOSIS — E1165 Type 2 diabetes mellitus with hyperglycemia: Secondary | ICD-10-CM

## 2022-05-26 DIAGNOSIS — E1159 Type 2 diabetes mellitus with other circulatory complications: Secondary | ICD-10-CM

## 2022-05-26 DIAGNOSIS — E785 Hyperlipidemia, unspecified: Secondary | ICD-10-CM

## 2022-05-26 DIAGNOSIS — I10 Essential (primary) hypertension: Secondary | ICD-10-CM

## 2022-05-26 LAB — POCT GLYCOSYLATED HEMOGLOBIN (HGB A1C): HbA1c, POC (controlled diabetic range): 12.8 % — AB (ref 0.0–7.0)

## 2022-05-26 LAB — POCT URINALYSIS DIP (CLINITEK)
Bilirubin, UA: NEGATIVE
Blood, UA: NEGATIVE
Glucose, UA: >=1000 mg/dL — AB
Ketones, POC UA: NEGATIVE mg/dL
Leukocytes, UA: NEGATIVE
Nitrite, UA: NEGATIVE
POC PROTEIN,UA: NEGATIVE
Spec Grav, UA: 1.01 (ref 1.010–1.025)
Urobilinogen, UA: 0.2 E.U./dL
pH, UA: 6 (ref 5.0–8.0)

## 2022-05-26 LAB — GLUCOSE, POCT (MANUAL RESULT ENTRY)
POC Glucose: 560 mg/dl — AB (ref 70–99)
POC Glucose: 437 mg/dl — AB (ref 70–99)

## 2022-05-26 MED ORDER — INSULIN PEN NEEDLE 32G X 4 MM MISC
1.0000 | Freq: Every day | 1 refills | Status: DC
  Filled 2022-05-26: qty 100, 100d supply, fill #0
  Filled 2022-11-10 – 2022-12-10 (×2): qty 100, 30d supply, fill #1

## 2022-05-26 MED ORDER — LISINOPRIL 10 MG PO TABS
10.0000 mg | ORAL_TABLET | Freq: Every day | ORAL | 1 refills | Status: DC
Start: 2022-05-26 — End: 2023-07-21
  Filled 2022-05-26: qty 90, 90d supply, fill #0
  Filled 2023-03-18: qty 30, 30d supply, fill #1

## 2022-05-26 MED ORDER — SODIUM CHLORIDE 0.9 % IV BOLUS: 1000.0000 mL | Freq: Once | INTRAVENOUS | Status: AC

## 2022-05-26 MED ORDER — BASAGLAR KWIKPEN 100 UNIT/ML ~~LOC~~ SOPN
20.0000 [IU] | PEN_INJECTOR | Freq: Every day | SUBCUTANEOUS | 2 refills | Status: DC
  Filled 2022-05-26: qty 9, 45d supply, fill #0

## 2022-05-26 MED ORDER — TRULICITY 0.75 MG/0.5ML ~~LOC~~ SOAJ
0.7500 mg | SUBCUTANEOUS | 2 refills | Status: DC
  Filled 2022-05-26: qty 2, 28d supply, fill #0
  Filled 2022-06-24: qty 2, 28d supply, fill #1

## 2022-05-26 MED ORDER — METFORMIN HCL 500 MG PO TABS
500.0000 mg | ORAL_TABLET | Freq: Two times a day (BID) | ORAL | 1 refills | Status: DC
  Filled 2022-05-26: qty 180, 90d supply, fill #0
  Filled 2022-11-09 – 2022-12-02 (×2): qty 60, 30d supply, fill #1

## 2022-05-26 MED ORDER — INSULIN ASPART 100 UNIT/ML IJ SOLN
25.0000 [IU] | Freq: Once | INTRAMUSCULAR | Status: AC
  Administered 2022-05-26: 25 [IU] via SUBCUTANEOUS

## 2022-05-26 NOTE — Patient Instructions (Addendum)
Drink 80-100 ounces water daily  Check blood sugars fasting and at bedtime.  Bring your blood sugar readings with you to your next visit

## 2022-05-26 NOTE — Progress Notes (Signed)
Patient ID: Laura Ashley, female   DOB: [birth year], 32 y.o.   MRN: [id number]   Laura Ashley, is a 32 y.o. female  [id number]  [id number]  DOB - March 21, 1990  Chief Complaint  Patient presents with   Diabetes       Subjective:   Laura Ashley is a 32 y.o. female here today for for diabetes check.  She has not been checking blood sugars.  She has been out of meds for months bc she could not afford them.  She was on 30 basaglar, 1.5 trulicity weekly and metformin 500 bid as well as lisinopril.  Surprisingly, she is not having vision changes.  She does have polydipsia.  She overall feels ok.  She decided to come in bc she is able to get her meds now.  Denies CP/SOB.  No dizziness.      No problems updated.  ALLERGIES: Allergies  Allergen Reactions   Penicillins Rash    Has patient had a PCN reaction causing immediate rash, facial/tongue/throat swelling, SOB or lightheadedness with hypotension: Yes Has patient had a PCN reaction causing severe rash involving mucus membranes or skin necrosis: Yes Has patient had a PCN reaction that required hospitalization: No Has patient had a PCN reaction occurring within the last 10 years: No If all of the above answers are "NO", then may proceed with Cephalosporin use.    PAST MEDICAL HISTORY: Past Medical History:  Diagnosis Date   Diabetes mellitus without complication ([place])    Hypertension    Medical history non-contributory    PCOS (polycystic ovarian syndrome)     MEDICATIONS AT HOME: Prior to Admission medications   Medication Sig Start Date End Date Taking? Authorizing Provider  Blood Glucose Monitoring Suppl (TRUE METRIX METER) w/Device KIT Use as directed 09/25/18  Yes Ladell Pier, MD  Dulaglutide (TRULICITY) A999333 [id number] SOPN Inject 0.75 mg into the skin once a week. 05/26/22  Yes Freeman Caldron M, PA-C  glucose blood (TRUE METRIX BLOOD GLUCOSE TEST) test strip Use as instructed 11/19/20  Yes Gildardo Pounds, NP  Insulin Pen Needle 32G X 4 MM MISC Using daily. 05/26/22  Yes Argentina Donovan, PA-C  TRUEplus Lancets 28G MISC Use as directed 05/18/19  Yes Minette Brine, Amy J, NP  famotidine (PEPCID) 20 MG tablet Take 1 tablet (20 mg total) by mouth 2 (two) times daily. 11/19/20 02/17/21  Gildardo Pounds, NP  Insulin Glargine [facility] KWIKPEN) 100 UNIT/ML Inject 20 Units into the skin daily. 05/26/22   Argentina Donovan, PA-C  lisinopril (ZESTRIL) 10 MG tablet Take 1 tablet (10 mg total) by mouth daily. 05/26/22   Argentina Donovan, PA-C  metFORMIN (GLUCOPHAGE) 500 MG tablet Take 1 tablet (500 mg total) by mouth 2 (two) times daily with a meal. 05/26/22   Daysia Vandenboom, Dionne Bucy, PA-C    ROS: Neg HEENT Neg resp Neg cardiac Neg GI Neg GU Neg MS Neg psych Neg neuro  Objective:   Vitals:   05/26/22 1500  BP: 138/84  Pulse: 87  SpO2: 97%  Weight: 247 lb 12.8 oz (112.4 kg)   Exam General appearance : Awake, alert, not in any distress. Speech Clear. Not toxic looking HEENT: Atraumatic and Normocephalic Neck: Supple, no JVD. No cervical lymphadenopathy.  Chest: Good air entry bilaterally, CTAB.  No rales/rhonchi/wheezing CVS: S1 S2 regular, no murmurs.  Extremities: B/L Lower Ext shows no edema, both legs are warm to touch Neurology: Awake alert, and oriented X 3, CN II-XII intact, Non focal  Skin: No Rash  Data Review Lab Results  Component Value Date   HGBA1C 12.8 (A) 05/26/2022   HGBA1C 11.7 (A) 02/20/2021   HGBA1C 12.0 (H) 11/19/2020    Assessment & Plan   1. Type 2 diabetes mellitus with hyperglycemia, unspecified whether long term insulin use (HCC) Uncontrolled-resume meds but at lower doses than given in history so that we don't drop her too quickly Drink 80-100 ounces water daily 1 L IVF today Check blood sugars fasting and at bedtime.  Bring your blood sugar readings with you to your next visit - Glucose (CBG) - HgB A1c - POCT URINALYSIS DIP (CLINITEK) - insulin aspart (novoLOG)  injection 25 Units - metFORMIN (GLUCOPHAGE) 500 MG tablet; Take 1 tablet (500 mg total) by mouth 2 (two) times daily with a meal.  Dispense: 180 tablet; Refill: 1 - Insulin Glargine (BASAGLAR KWIKPEN) 100 UNIT/ML; Inject 20 Units into the skin daily.  Dispense: 9 mL; Refill: 2 - Dulaglutide (TRULICITY) [id number] 0000000 SOPN; Inject 0.75 mg into the skin once a week.  Dispense: 2 mL; Refill: 2 - Insulin Pen Needle 32G X 4 MM MISC; Using daily.  Dispense: 100 each; Refill: 1 - Comprehensive metabolic panel; Future - Lipid panel; Future Blood sugar came down with IVF and insulin  2. Hypertension associated with type 2 diabetes mellitus (HCC) - lisinopril (ZESTRIL) 10 MG tablet; Take 1 tablet (10 mg total) by mouth daily.  Dispense: 90 tablet; Refill: 1 - CBC with Differential/Platelet; Future  3. Hyperlipidemia associated with type 2 diabetes mellitus ([place]) - Lipid panel; Future  4. Essential hypertension - lisinopril (ZESTRIL) 10 MG tablet; Take 1 tablet (10 mg total) by mouth daily.  Dispense: 90 tablet; Refill: 1 - CBC with Differential/Platelet; Future  5. Noncompliance Compliance imperative    Return for 1 month with Lurena Joiner for DM; 3 months with PCP for chronic conditions.  She will have labs at follow up visit  The patient was given clear instructions to go to ER or return to medical center if symptoms don't improve, worsen or new problems develop. The patient verbalized understanding. The patient was told to call to get lab results if they haven't heard anything in the next week.      Freeman Caldron, PA-C [facility] and [facility], [place]   05/26/2022, 3:09 PM

## 2022-06-01 ENCOUNTER — Telehealth: Payer: Self-pay | Admitting: Pharmacist

## 2022-06-01 ENCOUNTER — Other Ambulatory Visit: Payer: Self-pay

## 2022-06-01 NOTE — Telephone Encounter (Signed)
Laura Ashley,   This patient tells [place] she is waiting on her Medicaid card to show up in the mail. Once she gets this, we are instructing her to bring it to the pharmacy to let us know. Once it's active, can you let me know? I want to start her on CGM and it would be covered with a PA since she's on insulin.

## 2022-06-02 ENCOUNTER — Other Ambulatory Visit: Payer: Self-pay

## 2022-06-02 ENCOUNTER — Other Ambulatory Visit: Payer: Self-pay | Admitting: Pharmacist

## 2022-06-02 DIAGNOSIS — E1165 Type 2 diabetes mellitus with hyperglycemia: Secondary | ICD-10-CM

## 2022-06-02 MED ORDER — FREESTYLE LIBRE 2 SENSOR MISC
3 refills | Status: DC
  Filled 2022-06-02: qty 2, 28d supply, fill #0
  Filled 2022-06-14 – 2022-06-29 (×2): qty 2, 28d supply, fill #1
  Filled 2022-08-27: qty 2, 28d supply, fill #2

## 2022-06-02 MED ORDER — FREESTYLE LIBRE 2 READER DEVI
0 refills | Status: DC
  Filled 2022-06-02: qty 1, 28d supply, fill #0

## 2022-06-04 ENCOUNTER — Other Ambulatory Visit: Payer: Self-pay

## 2022-06-14 ENCOUNTER — Other Ambulatory Visit: Payer: Self-pay

## 2022-06-24 ENCOUNTER — Other Ambulatory Visit: Payer: Self-pay

## 2022-06-25 ENCOUNTER — Telehealth: Payer: Self-pay | Admitting: Internal Medicine

## 2022-06-25 ENCOUNTER — Other Ambulatory Visit: Payer: Self-pay

## 2022-06-25 NOTE — Telephone Encounter (Signed)
Copied from CRM [id number]. Topic: Appointment Scheduling - Scheduling Inquiry for Clinic >> Jun 25, 2022 10:55 AM Carrielelia G wrote: Reason for CRM: patient is requesting to reschedule her appointment or do a telephone visit

## 2022-06-28 ENCOUNTER — Telehealth: Payer: Self-pay | Admitting: Internal Medicine

## 2022-06-28 NOTE — Telephone Encounter (Signed)
Call placed to patient. Will convert her visit tomorrow to a telephone visit.

## 2022-06-28 NOTE — Telephone Encounter (Signed)
Called & spoke to the patient. Verified name & DOB. Patient stated that she just needed to clarify appointment day and time. Information provided. Appointments confirmed.

## 2022-06-28 NOTE — Telephone Encounter (Signed)
Copied from CRM [id number]. Topic: General - Other >> Jun 28, 2022  9:24 AM Franchot Heidelberg wrote: Reason for CRM: Pt called requesting to speak to [facility], please advise. Says this is regarding her appt for tomorrow   Best contact: [phone]

## 2022-06-29 ENCOUNTER — Encounter: Payer: Self-pay | Admitting: Pharmacist

## 2022-06-29 ENCOUNTER — Other Ambulatory Visit: Payer: Self-pay

## 2022-06-29 ENCOUNTER — Ambulatory Visit: Payer: Self-pay | Attending: Internal Medicine | Admitting: Pharmacist

## 2022-06-29 DIAGNOSIS — E1165 Type 2 diabetes mellitus with hyperglycemia: Secondary | ICD-10-CM

## 2022-06-29 MED ORDER — BASAGLAR KWIKPEN 100 UNIT/ML ~~LOC~~ SOPN
25.0000 [IU] | PEN_INJECTOR | Freq: Every day | SUBCUTANEOUS | 2 refills | Status: DC
  Filled 2022-06-29: qty 6, 24d supply, fill #0

## 2022-06-29 MED ORDER — INSULIN GLARGINE-YFGN 100 UNIT/ML ~~LOC~~ SOPN
25.0000 [IU] | PEN_INJECTOR | Freq: Every day | SUBCUTANEOUS | 1 refills | Status: DC
  Filled 2022-06-29: qty 6, 24d supply, fill #0

## 2022-06-29 NOTE — Progress Notes (Signed)
   I connected with  Laura Ashley on 06/29/22 by a video enabled telemedicine application and verified that I am speaking with the correct person using two identifiers.   I discussed the limitations of evaluation and management by telemedicine. The patient expressed understanding and agreed to proceed.  S:     No chief complaint on file.  32 y.o. female who presents for diabetes evaluation, education, and management.  PMH is significant for HTN, T2DM, PCOS.  Patient was referred and last seen by Primary Care Provider, Georgian Co, on 05/26/2022.  At last visit, A1c 12.8% increased from 11.7%. Patient reported not taking medications for months due to affordability. Reported she was able to get her medications now and was restarted on Trulicity 0.75 mg weekly, insulin glargine 20 units daily, and metformin 500 mg BID.   Today, patient seen through phone visit due to scheduling.   Patient reports Diabetes was diagnosed in 2019. Found out in the hospital. Has been on insulin since 2020. No thyroid cancer or pancreatitis. No hx of clinical ASCVD, CHF or CKD.   Family/Social History:  Father - diabetes  Current diabetes medications include: Trulicity 0.75 mg weekly, insulin glargine 20 units daily, and metformin 500 mg BID. Current hypertension medications include: lisinopril 10 mg daily  Patient reports adherence to taking all medications as prescribed.   Insurance coverage: none  Patient reports hypoglycemic events. Last week was 61, 64 ~3 times. Alarm woke her up over night. She attributes this to not eating.   Patient denies nocturia (nighttime urination).  Patient denies neuropathy (nerve pain). Patient denies visual changes. Patient reports self foot exams.   Patient reported dietary habits:  -Eliminated rice -Does struggle with potatoes  -Has cut back on portion sizes   Patient-reported exercise habits:  -Walks 4 days a week ~40 minutes  O:  CGM Download:  Time in  Goal:  - Time in range 70-180: 53% - Time above range: 47% - Time below range: 0%  Lab Results  Component Value Date   HGBA1C 12.8 (A) 05/26/2022   There were no vitals filed for this visit.  Lipid Panel     Component Value Date/Time   CHOL 190 02/20/2021 1524   TRIG 98 02/20/2021 1524   HDL 55 02/20/2021 1524   CHOLHDL 3.5 02/20/2021 1524   LDLCALC 117 (H) 02/20/2021 1524    Clinical Atherosclerotic Cardiovascular Disease (ASCVD): No  The ASCVD Risk score (Arnett DK, et al., 2019) failed to calculate for the following reasons:   The 2019 ASCVD risk score is only valid for ages 54 to 86   A/P: Diabetes longstanding currently uncontrolled based on A1c. Medication adherence appears appropriate and sugars are improving at home. -Increased dose of Basaglar to 25units. Switch to qAM dosing.  -Continue Trulicity 0.75 mg weekly. Cannot increase to 1.5 mg dose d/t shortage.  -Continue metformin 500 mg BID. Intolerant of higher doses. -Extensively discussed pathophysiology of diabetes, recommended lifestyle interventions, dietary effects on blood sugar control.  -Counseled on s/sx of and management of hypoglycemia.  -Next A1c anticipated 08/2022.   Written patient instructions provided. Patient verbalized understanding of treatment plan.  Total time in phone counseling 20 minutes.    Follow-up:  Pharmacist 1 month. PCP clinic visit in June 2024.   Butch Penny, PharmD, [place], CPP Clinical Pharmacist [facility] & [facility] [phone]

## 2022-07-01 ENCOUNTER — Other Ambulatory Visit: Payer: Self-pay

## 2022-07-23 ENCOUNTER — Ambulatory Visit: Payer: Self-pay

## 2022-07-23 ENCOUNTER — Telehealth: Payer: Self-pay | Admitting: Pharmacist

## 2022-07-23 ENCOUNTER — Ambulatory Visit: Payer: Self-pay | Attending: Internal Medicine | Admitting: Pharmacist

## 2022-07-23 DIAGNOSIS — E1165 Type 2 diabetes mellitus with hyperglycemia: Secondary | ICD-10-CM

## 2022-07-23 NOTE — Telephone Encounter (Signed)
Copied from CRM [id number]. Topic: General - Other >> Jul 23, 2022 10:57 AM Macon Large wrote: Reason for CRM: Pt requests to speak with [facility]. Pt requests call back at [phone]

## 2022-07-23 NOTE — Telephone Encounter (Signed)
Pt states that her blood sugar keep spiking up. Last night it was 338 and stayed in the 300's and she just checked it and it is showing 212. Pt states that she is not having any symptoms but is unsure what to do to get her sugar levels down. Pt states that she just put a new sensor on to check her blood sugar levels and they are still high. Please advise.      Chief Complaint: Blood glucose running high x 2 days - 338-212. Usually 109-160. Symptoms: Above Frequency: 2 days  Pertinent Negatives: Patient denies any symptoms. No changes in diet. Disposition: [] ED /[] Urgent Care (no appt availability in office) / [] Appointment(In office/virtual)/ []  [place] [facility]/ [] Home Care/ [] Refused Recommended Disposition /[] [place] [facility]/ [x]  Follow-up with PCP Additional Notes: Please advise pt.  Answer Assessment - Initial Assessment Questions 1. BLOOD GLUCOSE: "What is your blood glucose level?"      221 now 2. ONSET: "When did you check the blood glucose?"     Few minutes ago 3. USUAL RANGE: "What is your glucose level usually?" (e.g., usual fasting morning value, usual evening value)     109-160 4. KETONES: "Do you check for ketones (urine or blood test strips)?" If Yes, ask: "What does the test show now?"      No 5. TYPE 1 or 2:  "Do you know what type of diabetes you have?"  (e.g., Type 1, Type 2, Gestational; doesn't know)      Type 2  6. INSULIN: "Do you take insulin?" "What type of insulin(s) do you use? What is the mode of delivery? (syringe, pen; injection or pump)?"      Yes  7. DIABETES PILLS: "Do you take any pills for your diabetes?" If Yes, ask: "Have you missed taking any pills recently?"     Metformin 8. OTHER SYMPTOMS: "Do you have any symptoms?" (e.g., fever, frequent urination, difficulty breathing, dizziness, weakness, vomiting)     None 9. PREGNANCY: "Is there any chance you are pregnant?" "When was your last menstrual period?"     No  Protocols used:  Diabetes - High Blood Sugar-A-AH

## 2022-07-23 NOTE — Telephone Encounter (Signed)
Routing to pcp

## 2022-07-23 NOTE — Telephone Encounter (Signed)
Called and offered telephone visit. Please see my documentation under that encounter.

## 2022-07-23 NOTE — Progress Notes (Signed)
   I connected with  [name] on 07/23/22 by a video enabled telemedicine application and verified that I am speaking with the correct person using two identifiers.   I discussed the limitations of evaluation and management by telemedicine. The patient expressed understanding and agreed to proceed.  S:     No chief complaint on file.  32 y.o. female who presents for diabetes evaluation, education, and management.  PMH is significant for HTN, T2DM, PCOS. Patient was referred and last seen by Georgian Co on 05/26/2022. We saw her on 06/29/2022 via telephone and her sugars were great.   Today, patient denies any complaints. Denies recent fever, URI, cough/cold symptoms. Denies any NV, abdominal pain. No GU or UTI symptoms. Denies being in pain or using any anti-inflammatories or steroids. Regarding her diet, she denies any big changes. Denies any symptoms of hyperglycemia. Endorses some mild fatigue but attributes to work. For her sugars, last night her CBG was 338 and stayed in the 300's overnight. She just checked it while on the phone with me, and it is showing 271. Ate a sandwich about 1 hour ago. Libre time in range values listed below.   Family/Social History:  Father - diabetes  Current diabetes medications include: Trulicity 0.75 mg weekly, insulin glargine 25 units daily, and metformin 500 mg BID. Patient reports adherence to taking all medications as prescribed.   Insurance coverage: none  Patient denies hypoglycemic events.  Patient denies nocturia (nighttime urination).  Patient denies neuropathy (nerve pain). Patient denies visual changes. Patient reports self foot exams.   Patient reported dietary habits:  -Eliminated rice -Does struggle with potatoes  -Has cut back on portion sizes   Patient-reported exercise habits:  -Walks 4 days a week ~40 minutes  O:  CGM Download over the past 7 days:  - Time in range 70-180: 85% - Time above range: 15% - Avg: 234  mg/dL   Lab Results  Component Value Date   HGBA1C 12.8 (A) 05/26/2022   There were no vitals filed for this visit.  Lipid Panel     Component Value Date/Time   CHOL 190 02/20/2021 1524   TRIG 98 02/20/2021 1524   HDL 55 02/20/2021 1524   CHOLHDL 3.5 02/20/2021 1524   LDLCALC 117 (H) 02/20/2021 1524    Clinical Atherosclerotic Cardiovascular Disease (ASCVD): No  The ASCVD Risk score (Arnett DK, et al., 2019) failed to calculate for the following reasons:   The 2019 ASCVD risk score is only valid for ages 67 to 19   A/P: Diabetes longstanding currently uncontrolled based on A1c. Medication adherence appears appropriate, however, sugars have been elevated over the past week. We cannot identify any reasons such as infection, stress, inflammation, or changes in eating habits. We will increase her insulin dose until she sees me in person next week.  -Increased dose of Basaglar to 32u.  -Continue Trulicity 0.75 mg weekly. Cannot increase to 1.5 mg dose d/t shortage.  -Continue metformin 500 mg BID. Intolerant of higher doses. -Extensively discussed pathophysiology of diabetes, recommended lifestyle interventions, dietary effects on blood sugar control.  -Counseled on s/sx of and management of hypoglycemia.  -Next A1c anticipated 08/2022.   Written patient instructions provided. Patient verbalized understanding of treatment plan.  Total time in phone counseling 20 minutes.    Follow-up:  Pharmacist next week. PCP clinic visit in June 2024.   Butch Penny, PharmD, [place], CPP Clinical Pharmacist [facility] & [facility] [phone]

## 2022-07-27 ENCOUNTER — Encounter: Payer: Self-pay | Admitting: Pharmacist

## 2022-07-27 ENCOUNTER — Other Ambulatory Visit: Payer: Self-pay

## 2022-07-27 ENCOUNTER — Ambulatory Visit: Payer: BC Managed Care – PPO | Attending: Family Medicine | Admitting: Pharmacist

## 2022-07-27 VITALS — Wt 252.2 lb

## 2022-07-27 DIAGNOSIS — Z794 Long term (current) use of insulin: Secondary | ICD-10-CM | POA: Diagnosis not present

## 2022-07-27 DIAGNOSIS — Z7985 Long-term (current) use of injectable non-insulin antidiabetic drugs: Secondary | ICD-10-CM | POA: Diagnosis not present

## 2022-07-27 DIAGNOSIS — E1165 Type 2 diabetes mellitus with hyperglycemia: Secondary | ICD-10-CM

## 2022-07-27 DIAGNOSIS — Z7984 Long term (current) use of oral hypoglycemic drugs: Secondary | ICD-10-CM | POA: Diagnosis not present

## 2022-07-27 MED ORDER — TRULICITY 1.5 MG/0.5ML ~~LOC~~ SOAJ
1.5000 mg | SUBCUTANEOUS | 1 refills | Status: DC
  Filled 2022-07-27: qty 2, 28d supply, fill #0

## 2022-07-27 MED ORDER — INSULIN GLARGINE-YFGN 100 UNIT/ML ~~LOC~~ SOPN
32.0000 [IU] | PEN_INJECTOR | Freq: Every day | SUBCUTANEOUS | 1 refills | Status: DC
  Filled 2022-07-27 – 2022-09-27 (×2): qty 9, 28d supply, fill #0
  Filled 2022-11-09 – 2022-12-02 (×2): qty 9, 28d supply, fill #1

## 2022-07-27 NOTE — Progress Notes (Signed)
   S:     No chief complaint on file.  32 y.o. female who presents for diabetes evaluation, education, and management.  PMH is significant for HTN, T2DM, PCOS. Patient was referred by Dr. Laural Benes on 07/23/2022 after she called to the clinic with reports of uncontrolled hyperglycemia. I spoke with her on that day on the phone and instructed her to increase her insulin dose to 32u daily.  Today, patient denies any complaints. Her sugar is coming down since we spoke Friday. Denies any symptoms of hyperglycemia. Endorses some mild fatigue but attributes to work. For her sugars, her meter shows sugars returning to the 100 range over the last 24 hours. She has not had anything >300 since I spoke with her Friday. Libre time in range values listed below.   Family/Social History:  Father - diabetes  Current diabetes medications include: Trulicity 0.75 mg weekly, insulin glargine 32 units daily, and metformin 500 mg BID. Patient reports adherence to taking all medications as prescribed.   Insurance coverage: none  Patient denies hypoglycemic events.  Patient denies nocturia (nighttime urination).  Patient denies neuropathy (nerve pain). Patient denies visual changes. Patient reports self foot exams.   Patient reported dietary habits:  -Eliminated rice -Does struggle with potatoes  -Has cut back on portion sizes   Patient-reported exercise habits:  -Walks 4 days a week ~40 minutes  O:  CGM Download over the past 7 days:  -No blood sugar >300s - Time in range 70-180: 20% - Time above range: 80% - Avg: 215 mg/dL   Lab Results  Component Value Date   HGBA1C 12.8 (A) 05/26/2022   There were no vitals filed for this visit.  Lipid Panel     Component Value Date/Time   CHOL 190 02/20/2021 1524   TRIG 98 02/20/2021 1524   HDL 55 02/20/2021 1524   CHOLHDL 3.5 02/20/2021 1524   LDLCALC 117 (H) 02/20/2021 1524    Clinical Atherosclerotic Cardiovascular Disease (ASCVD): No  The  ASCVD Risk score (Arnett DK, et al., 2019) failed to calculate for the following reasons:   The 2019 ASCVD risk score is only valid for ages 44 to 40   A/P: Diabetes longstanding currently uncontrolled based on A1c. Medication adherence appears appropriate and home sugars are starting to come down. She is asymptomatic at this time. We will have her continue current doses of insulin and metformin and optimize Trulicity to 1.5 mg weekly.  -Increased dose of Trulicity to 1.5 mg weekly.  -Continue Semglee 32u daily.  -Continue metformin 500 mg BID. Intolerant of higher doses. -Extensively discussed pathophysiology of diabetes, recommended lifestyle interventions, dietary effects on blood sugar control.  -Counseled on s/sx of and management of hypoglycemia.  -Next A1c anticipated 08/2022.   Written patient instructions provided. Patient verbalized understanding of treatment plan.  Total time in phone counseling 20 minutes.    Follow-up:  Pharmacist next week. PCP clinic visit in June 2024.   Butch Penny, PharmD, [place], CPP Clinical Pharmacist [facility] [phone]

## 2022-08-02 ENCOUNTER — Other Ambulatory Visit: Payer: Self-pay

## 2022-08-26 NOTE — Progress Notes (Signed)
    S:     No chief complaint on file.  32 y.o. female who presents for diabetes evaluation, education, and management.  PMH is significant for T2DM, HTN, HLD, PCOS.   Patient was referred by Primary Care Provider, Dr. Laural Benes on 07/23/2022 due to patient experiencing uncontrolled hyperglycemia. Last seen by pharmacy clinic on 07/27/2022, where trulicity was increased from 0.75 mg to 1.5 mg weekly. Semglee was increased from 25 to 32 units due to sugars > 200s.   Today, patient arrives in good spirits and presents without any assistance. Diabetes longstanding. Patient reports not having her sensor on for the past ~2 weeks due to sensor falling off at work. I am unable to check her libre sensor. Checks her sugars conventionally and fasting sugars was 190 this morning. Post meal sugars > 200s. Reports adherence to the diabetic medications and denies hypoglycemic events. Reports GI sx when she started taking trulicity 1.5, but sx have subsided.   Patient denies nocturia (nighttime urination).  Patient denies neuropathy (nerve pain). Patient denies visual changes. Patient reports self foot exams.   Currently not taking lisinopril 10 mg because she takes it as needed for headaches. She checks her BP at [employer] but she can not recall her past numbers at the moment.  Current diabetes medications include: Trulicity 1.5 mg weekly, semglee 32 units daily, metformin 500 mg BID Current hypertension medications include: lisinopril 10 mg daily  Family/Social History:  Shx: Father - diabetes Tobacco: Former Engineer, site coverage: BCBS  O:   ROS  Physical Exam   Lab Results  Component Value Date   HGBA1C 12.8 (A) 05/26/2022   There were no vitals filed for this visit.  Lipid Panel     Component Value Date/Time   CHOL 190 02/20/2021 1524   TRIG 98 02/20/2021 1524   HDL 55 02/20/2021 1524   CHOLHDL 3.5 02/20/2021 1524   LDLCALC 117 (H) 02/20/2021 1524    Clinical  Atherosclerotic Cardiovascular Disease (ASCVD): No  The ASCVD Risk score (Arnett DK, et al., 2019) failed to calculate for the following reasons:   The 2019 ASCVD risk score is only valid for ages 99 to 65   Patient is participating in a Managed Medicaid Plan:  No   A/P: Diabetes currently uncontrolled. Patient is able to verbalize appropriate hypoglycemia management plan. Medication adherence appears appropriate. Patient concerned about gaining weight. Increased trulicity dose to help with weight loss. Got labs drawn for A1c and CMP. -Continued Semglee 32 units daily. -Increased dose of Trulicity 3 mg weekly. -Continued metformin 500 mg BID (intolerate to higher doses).  -Patient educated on purpose, proper use, and potential adverse effects.  -Extensively discussed pathophysiology of diabetes, recommended lifestyle interventions, dietary effects on blood sugar control.  -Counseled on s/sx of and management of hypoglycemia.  -Next A1c anticipated September 2024.   Hypertension currently uncontrolled. Blood pressure goal of <130/80 mmHg. Medication adherence poor. Instructed patient to restart medication and emphasized importance of adherence. -Restarted lisinopril 10 mg daily.  Written patient instructions provided. Patient verbalized understanding of treatment plan.  Total time in face to face counseling 30 minutes.    Follow-up:  Pharmacist in 1 month. PCP clinic visit 6/28.   Patient seen with  Alesia Banda, PharmD Candidate [employer]   Butch Penny, PharmD, [place], CPP Clinical Pharmacist [facility] & [facility] [phone]

## 2022-08-27 ENCOUNTER — Ambulatory Visit: Payer: BC Managed Care – PPO | Attending: Internal Medicine | Admitting: Pharmacist

## 2022-08-27 ENCOUNTER — Other Ambulatory Visit: Payer: Self-pay

## 2022-08-27 ENCOUNTER — Encounter: Payer: Self-pay | Admitting: Pharmacist

## 2022-08-27 VITALS — Wt 257.6 lb

## 2022-08-27 DIAGNOSIS — E1165 Type 2 diabetes mellitus with hyperglycemia: Secondary | ICD-10-CM | POA: Diagnosis not present

## 2022-08-27 DIAGNOSIS — Z794 Long term (current) use of insulin: Secondary | ICD-10-CM | POA: Diagnosis not present

## 2022-08-27 MED ORDER — TRULICITY 3 MG/0.5ML ~~LOC~~ SOAJ
3.0000 mg | SUBCUTANEOUS | 1 refills | Status: DC
  Filled 2022-08-27: qty 2, 28d supply, fill #0

## 2022-08-28 LAB — CMP14+EGFR
ALT: 18 IU/L (ref 0–32)
AST: 16 IU/L (ref 0–40)
Albumin/Globulin Ratio: 1.6
Albumin: 4.3 g/dL (ref 4.0–5.0)
Alkaline Phosphatase: 73 IU/L (ref 44–121)
BUN/Creatinine Ratio: 16 (ref 9–23)
BUN: 10 mg/dL (ref 6–20)
Bilirubin Total: 0.4 mg/dL (ref 0.0–1.2)
CO2: 23 mmol/L (ref 20–29)
Calcium: 9.6 mg/dL (ref 8.7–10.2)
Chloride: 99 mmol/L (ref 96–106)
Creatinine, Ser: 0.62 mg/dL (ref 0.57–1.00)
Globulin, Total: 2.7 g/dL (ref 1.5–4.5)
Glucose: 181 mg/dL — ABNORMAL HIGH (ref 70–99)
Potassium: 4.6 mmol/L (ref 3.5–5.2)
Sodium: 136 mmol/L (ref 134–144)
Total Protein: 7 g/dL (ref 6.0–8.5)
eGFR: 124 mL/min/{1.73_m2} (ref >59)

## 2022-08-28 LAB — HEMOGLOBIN A1C
Est. average glucose Bld gHb Est-mCnc: 189 mg/dL
Hgb A1c MFr Bld: 8.2 % — ABNORMAL HIGH (ref 4.8–5.6)

## 2022-08-30 ENCOUNTER — Ambulatory Visit: Payer: BC Managed Care – PPO | Admitting: Podiatry

## 2022-09-01 ENCOUNTER — Ambulatory Visit (INDEPENDENT_AMBULATORY_CARE_PROVIDER_SITE_OTHER): Payer: BC Managed Care – PPO | Admitting: Podiatry

## 2022-09-01 ENCOUNTER — Encounter: Payer: Self-pay | Admitting: Podiatry

## 2022-09-01 DIAGNOSIS — L600 Ingrowing nail: Secondary | ICD-10-CM

## 2022-09-02 NOTE — Progress Notes (Signed)
Subjective:   Patient ID: Laura Ashley, female   DOB: 32 y.o.   MRN: [id number]   HPI Patient presents stating she is concerned about her left big toenail that she is lost half the nail and that she did traumatize it several years ago and it is given her some off-and-on trouble since then   Review of Systems  All other systems reviewed and are negative.       Objective:  Physical Exam Vitals and nursing note reviewed.  Constitutional:      Appearance: She is well-developed.  Pulmonary:     Effort: Pulmonary effort is normal.  Musculoskeletal:        General: Normal range of motion.  Skin:    General: Skin is warm.  Neurological:     Mental Status: She is alert.     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

## 2022-09-10 ENCOUNTER — Ambulatory Visit: Payer: BC Managed Care – PPO | Attending: Internal Medicine | Admitting: Internal Medicine

## 2022-09-10 ENCOUNTER — Encounter: Payer: Self-pay | Admitting: Internal Medicine

## 2022-09-10 DIAGNOSIS — E1159 Type 2 diabetes mellitus with other circulatory complications: Secondary | ICD-10-CM | POA: Diagnosis not present

## 2022-09-10 DIAGNOSIS — E1169 Type 2 diabetes mellitus with other specified complication: Secondary | ICD-10-CM | POA: Diagnosis not present

## 2022-09-10 DIAGNOSIS — I152 Hypertension secondary to endocrine disorders: Secondary | ICD-10-CM | POA: Diagnosis not present

## 2022-09-10 DIAGNOSIS — Z7984 Long term (current) use of oral hypoglycemic drugs: Secondary | ICD-10-CM

## 2022-09-10 DIAGNOSIS — Z794 Long term (current) use of insulin: Secondary | ICD-10-CM

## 2022-09-10 DIAGNOSIS — Z7985 Long-term (current) use of injectable non-insulin antidiabetic drugs: Secondary | ICD-10-CM

## 2022-09-10 NOTE — Progress Notes (Signed)
Patient ID: Laura Ashley, female   DOB: Nov 10, 1990, 32 y.o.   MRN: [id number] Virtual Visit via Telephone Note  I connected with Laura Ashley on 09/10/2022 at 3:07 PM by telephone and verified that I am speaking with the correct person using two identifiers  Location: Patient: home Provider: office  Participants: Myself Patient   I discussed the limitations, risks, security and privacy concerns of performing an evaluation and management service by telephone and the availability of in person appointments. I also discussed with the patient that there may be a patient responsible charge related to this service. The patient expressed understanding and agreed to proceed.   History of Present Illness: Patient with history of DM type II microalbumin, HTN, obesity, microalbuminuria, PCOS. For chronic ds management.  DM: Lab Results  Component Value Date   HGBA1C 8.2 (H) 08/27/2022  Reports compliance with Trulicity 3 mg (just increased to this dose by clinical pharmacist 08/27/2022), Semglee 32 units daily and Metformin 500 mg BID Has CGM but does not have reader with her at this time.  Reports range this wk 120s with spikes 240s post meals; decreases to 160s with in 1-2 hrs post meals -Trulicity has decreased her appetite; currently wgh is about 250 lbs; reports lost of 10 lbs since being back on Trulicity since 05/2022. 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 E11.65   Continuous Glucose Sensor (FREESTYLE LIBRE 2 SENSOR) MISC Use to check blood glucose continuously. Change sensors once every 14 days. E11.65   Dulaglutide (TRULICITY) 3  MG/0.5ML SOPN Inject 3 mg as directed once a week.   glucose blood (TRUE METRIX BLOOD GLUCOSE TEST) test strip Use as instructed   insulin glargine-yfgn (SEMGLEE) 100 UNIT/ML Pen Inject 32 Units into the skin daily.   Insulin Pen Needle 32G X 4 MM MISC Using daily.   lisinopril (ZESTRIL) 10 MG tablet Take 1 tablet (10 mg total) by mouth daily.   metFORMIN (GLUCOPHAGE) 500 MG tablet Take 1 tablet (500 mg total) by mouth 2 (two) times daily with a meal.   TRUEplus Lancets 28G MISC Use as directed   famotidine (PEPCID) 20 MG tablet Take 1 tablet (20 mg total) by mouth 2 (two) times daily.   Facility-Administered Encounter Medications as of 09/10/2022  Medication   sodium chloride 0.9 % bolus 1,000 mL        Observations/Objective: No direct observation done as this was a telephone visit.  Assessment and Plan: 1. Type 2 diabetes mellitus with morbid obesity (HCC) Continue Semglee 32 units daily, Trulicity 3 mg once a week and metformin 500 mg twice a day.  Encourage healthy eating habits.  Continue to move is much as she can. - Ambulatory referral to Ophthalmology  2. Hypertension associated with type 2 diabetes mellitus (HCC) Continue lisinopril and low-salt diet.  We will plan to check blood pressure when I see her in 1 month to do her Pap smear.   Follow Up Instructions: Follow-up in 1 month for Pap smear   I discussed the assessment and treatment plan with the patient. The patient was provided an opportunity to ask questions and all  were answered. The patient agreed with the plan and demonstrated an understanding of the instructions.   The patient was advised to call back or seek an in-person evaluation if the symptoms worsen or if the condition fails to improve as anticipated.  I  Spent 15 minutes on this telephone encounter  This note has been created with Education officer, environmental. Any transcriptional errors are unintentional.  Jonah Blue, MD

## 2022-09-27 ENCOUNTER — Ambulatory Visit: Payer: BC Managed Care – PPO | Attending: Family Medicine | Admitting: Pharmacist

## 2022-09-27 ENCOUNTER — Other Ambulatory Visit: Payer: Self-pay

## 2022-09-27 ENCOUNTER — Encounter: Payer: Self-pay | Admitting: Pharmacist

## 2022-09-27 DIAGNOSIS — Z794 Long term (current) use of insulin: Secondary | ICD-10-CM

## 2022-09-27 DIAGNOSIS — Z7985 Long-term (current) use of injectable non-insulin antidiabetic drugs: Secondary | ICD-10-CM

## 2022-09-27 DIAGNOSIS — Z7984 Long term (current) use of oral hypoglycemic drugs: Secondary | ICD-10-CM | POA: Diagnosis not present

## 2022-09-27 DIAGNOSIS — E1169 Type 2 diabetes mellitus with other specified complication: Secondary | ICD-10-CM

## 2022-09-27 DIAGNOSIS — H04123 Dry eye syndrome of bilateral lacrimal glands: Secondary | ICD-10-CM | POA: Diagnosis not present

## 2022-09-27 DIAGNOSIS — E119 Type 2 diabetes mellitus without complications: Secondary | ICD-10-CM | POA: Diagnosis not present

## 2022-09-27 DIAGNOSIS — R519 Headache, unspecified: Secondary | ICD-10-CM | POA: Diagnosis not present

## 2022-09-27 LAB — HM DIABETES EYE EXAM

## 2022-09-27 MED ORDER — ONETOUCH VERIO W/DEVICE KIT
PACK | 0 refills | Status: DC
  Filled 2022-09-27: qty 1, 30d supply, fill #0

## 2022-09-27 MED ORDER — TRULICITY 4.5 MG/0.5ML ~~LOC~~ SOAJ
4.5000 mg | SUBCUTANEOUS | 1 refills | Status: DC
  Filled 2022-09-27: qty 2, 28d supply, fill #0
  Filled 2022-11-09 – 2022-12-02 (×2): qty 2, 28d supply, fill #1

## 2022-09-27 MED ORDER — ONETOUCH DELICA LANCETS 33G MISC
6 refills | Status: DC
  Filled 2022-09-27: qty 100, 30d supply, fill #0

## 2022-09-27 MED ORDER — ONETOUCH VERIO VI STRP
ORAL_STRIP | 3 refills | Status: DC
  Filled 2022-09-27: qty 100, 30d supply, fill #0

## 2022-09-27 NOTE — Progress Notes (Signed)
    S:     No chief complaint on file.  32 y.o. female who presents for diabetes evaluation, education, and management.  PMH is significant for T2DM, HTN, HLD, PCOS.   Patient was referred by Primary Care Provider, Dr. Laural Benes on 09/10/2022. Last seen by pharmacy clinic on 08/27/2022. A1c at that visit was 8.2 (down from 12.8 prior).    Today, patient arrives in good spirits and presents without any assistance. Diabetes longstanding. Patient reports ongoing issues with her CGM. Her sensors continue to pop off. She checks her sugars via finger stick now. Reports adherence to the diabetic medications and denies hypoglycemic events. No side effects with her medication currently. No NV, abdominal pain, or blurred vision since increasing the Trulicity dose.   Patient denies nocturia (nighttime urination).  Patient denies neuropathy (nerve pain). Patient denies visual changes. Patient reports self foot exams.   Current diabetes medications include: Trulicity 3 mg weekly, Semglee 32 units daily, metformin 500 mg BID  Family/Social History:  Shx: Father - diabetes Tobacco: Former Engineer, site coverage: BCBS  O:  Lab Results  Component Value Date   HGBA1C 8.2 (H) 08/27/2022   There were no vitals filed for this visit.  Lipid Panel     Component Value Date/Time   CHOL 190 02/20/2021 1524   TRIG 98 02/20/2021 1524   HDL 55 02/20/2021 1524   CHOLHDL 3.5 02/20/2021 1524   LDLCALC 117 (H) 02/20/2021 1524    Clinical Atherosclerotic Cardiovascular Disease (ASCVD): No  The ASCVD Risk score (Arnett DK, et al., 2019) failed to calculate for the following reasons:   The 2019 ASCVD risk score is only valid for ages 21 to 87   Patient is participating in a Managed Medicaid Plan:  No   A/P: Diabetes currently uncontrolled but improved. Patient is able to verbalize appropriate hypoglycemia management plan. Medication adherence appears appropriate. Discussed other options to assist with  weight loss such as Ozempic and Mounjaro. With her insurance, we may be able to get PA approval if we titrate to Trulicity 4.5mg  and she does not meet her weight loss goals. -Continued Semglee 32 units daily. -Increased dose of Trulicity 4.5 mg weekly. -Continued metformin 500 mg BID (intolerate to higher doses).  -Patient educated on purpose, proper use, and potential adverse effects.  -Extensively discussed pathophysiology of diabetes, recommended lifestyle interventions, dietary effects on blood sugar control.  -Counseled on s/sx of and management of hypoglycemia.  -Next A1c anticipated September 2024.   Written patient instructions provided. Patient verbalized understanding of treatment plan.  Total time in face to face counseling 30 minutes.    Follow-up:  Pharmacist in 1 month. PCP clinic visit 11/18/2022.  Butch Penny, PharmD, [place], CPP Clinical Pharmacist [facility] & [facility] [phone]

## 2022-09-28 ENCOUNTER — Other Ambulatory Visit: Payer: Self-pay

## 2022-09-29 ENCOUNTER — Other Ambulatory Visit: Payer: Self-pay

## 2022-10-28 ENCOUNTER — Ambulatory Visit: Payer: Self-pay | Admitting: Pharmacist

## 2022-11-08 NOTE — Progress Notes (Signed)
    S:     No chief complaint on file.  32 y.o. female who presents for diabetes evaluation, education, and management. PMH is significant for T2DM, HTN, HLD, PCOS, .   Patient was referred and last seen by Primary Care Provider, Dr. Laural Benes, on 09/10/22. Patient was last seen by Pharmacy clinic on 09/27/22. At that visit, Trulicity was increased from 3 mg to 4.5 mg weekly. She reported issues w/ her CGM sensors falling off, but was otherwise doing well, A1c 8.2% on 08/27/22 close to goal <7%  Today, patient arrives in good spirits and presents without any assistance. She missed 1 week of Trulicity since last visit and then resumed with a higher dose, 4.5 mg weekly. She developed nausea for several days but no vomiting, abdominal pain, or vision changes.    Family/Social History:  Fhx: Father - diabetes Tobacco: Former Smoker  Current diabetes medications include: metformin 500 mg BID (intolerant to higher doses), Trulicity 4.5 mg weekly, Semglee 32 units daily Current hypertension medications include: lisinopril 10 mg daily Current hyperlipidemia medications include: none  Patient reports adherence to taking all medications as prescribed.   Insurance coverage: BCBS  Patient denies hypoglycemic events.  Reported home fasting blood sugars: has not checked as much recently  Reported 2 hour post-meal/random blood sugars: has not checked as much recently.  Patient denies nocturia (nighttime urination).  Patient denies neuropathy (nerve pain). Patient denies visual changes. Patient reports self foot exams.   Patient reported dietary habits:  -No changes since last visit  Patient-reported exercise habits: no changes since last visit   O:   Lab Results  Component Value Date   HGBA1C 8.2 (H) 08/27/2022   There were no vitals filed for this visit.  Lipid Panel     Component Value Date/Time   CHOL 190 02/20/2021 1524   TRIG 98 02/20/2021 1524   HDL 55 02/20/2021 1524    CHOLHDL 3.5 02/20/2021 1524   LDLCALC 117 (H) 02/20/2021 1524    Clinical Atherosclerotic Cardiovascular Disease (ASCVD): No  The ASCVD Risk score (Arnett DK, et al., 2019) failed to calculate for the following reasons:   The 2019 ASCVD risk score is only valid for ages 43 to 24   Patient is participating in a Managed Medicaid Plan:  No   A/P: Diabetes longstanding currently uncontrolled. Patient is able to verbalize appropriate hypoglycemia management plan. Medication adherence appears appropriate. Will have her continue her current regimen and follow-up with PCP 11/18/2022. -Continued current regimen.  -Patient educated on purpose, proper use, and potential adverse effects of Trulicity.  -Extensively discussed pathophysiology of diabetes, recommended lifestyle interventions, dietary effects on blood sugar control.  -Counseled on s/sx of and management of hypoglycemia.  -Next A1c anticipated 11/2022.   Written patient instructions provided. Patient verbalized understanding of treatment plan.  Total time in face to face counseling 30 minutes.    Follow-up:  Pharmacist in 1 month PCP clinic visit next week.   Butch Penny, PharmD, [place], CPP Clinical Pharmacist [facility] & [facility] [phone]

## 2022-11-09 ENCOUNTER — Ambulatory Visit: Payer: BC Managed Care – PPO | Attending: Internal Medicine | Admitting: Pharmacist

## 2022-11-09 ENCOUNTER — Encounter: Payer: Self-pay | Admitting: Pharmacist

## 2022-11-09 ENCOUNTER — Other Ambulatory Visit: Payer: Self-pay

## 2022-11-09 DIAGNOSIS — Z7985 Long-term (current) use of injectable non-insulin antidiabetic drugs: Secondary | ICD-10-CM | POA: Diagnosis not present

## 2022-11-09 DIAGNOSIS — E1169 Type 2 diabetes mellitus with other specified complication: Secondary | ICD-10-CM | POA: Diagnosis not present

## 2022-11-09 DIAGNOSIS — Z7984 Long term (current) use of oral hypoglycemic drugs: Secondary | ICD-10-CM

## 2022-11-09 DIAGNOSIS — Z794 Long term (current) use of insulin: Secondary | ICD-10-CM | POA: Diagnosis not present

## 2022-11-10 ENCOUNTER — Other Ambulatory Visit: Payer: Self-pay

## 2022-11-11 ENCOUNTER — Other Ambulatory Visit: Payer: Self-pay

## 2022-11-17 ENCOUNTER — Other Ambulatory Visit: Payer: Self-pay

## 2022-11-18 ENCOUNTER — Ambulatory Visit: Payer: BC Managed Care – PPO | Admitting: Internal Medicine

## 2022-12-02 ENCOUNTER — Telehealth: Payer: Self-pay | Admitting: Pharmacist

## 2022-12-02 ENCOUNTER — Other Ambulatory Visit: Payer: Self-pay

## 2022-12-02 NOTE — Telephone Encounter (Signed)
Copied from CRM [id number]. Topic: General - Other >> Dec 02, 2022 12:28 PM Phill Myron wrote: Dulaglutide (TRULICITY) 4.5 MG/0.5ML Namon Cirri [[id number],  Pharmacy instructed that I speak with Franky Macho  to discuss changing this medication. Please advise

## 2022-12-08 ENCOUNTER — Other Ambulatory Visit: Payer: Self-pay

## 2022-12-10 ENCOUNTER — Other Ambulatory Visit: Payer: Self-pay

## 2022-12-14 ENCOUNTER — Other Ambulatory Visit: Payer: Self-pay

## 2022-12-14 ENCOUNTER — Telehealth: Payer: Self-pay | Admitting: Pharmacist

## 2022-12-14 ENCOUNTER — Other Ambulatory Visit: Payer: Self-pay | Admitting: Pharmacist

## 2022-12-14 MED ORDER — OZEMPIC (0.25 OR 0.5 MG/DOSE) 2 MG/3ML ~~LOC~~ SOPN
0.2500 mg | PEN_INJECTOR | SUBCUTANEOUS | 1 refills | Status: DC
  Filled 2022-12-14: qty 3, 28d supply, fill #0
  Filled 2022-12-24: qty 3, 30d supply, fill #0
  Filled 2023-02-03: qty 3, 30d supply, fill #1

## 2022-12-14 NOTE — Telephone Encounter (Signed)
Call returned. Pt changed to Ozempic. Covered the medication and how it works. She has been without Trulicity for a couple of weeks, so we will start Ozempic at the 0.25mg  weekly dose and titrate as able. OV scheduled for 01/03/23 at 2:30pm.

## 2022-12-14 NOTE — Telephone Encounter (Signed)
Can we start a PA for this patient's Ozempic if needed? I am changing her from Trulicity as she was unable to achieve goal A1c control and the Trulicity gave her significant nausea and vomiting at the higher doses.

## 2022-12-17 ENCOUNTER — Other Ambulatory Visit: Payer: Self-pay

## 2022-12-22 ENCOUNTER — Other Ambulatory Visit: Payer: Self-pay

## 2022-12-24 ENCOUNTER — Other Ambulatory Visit: Payer: Self-pay

## 2023-01-03 ENCOUNTER — Ambulatory Visit: Payer: BC Managed Care – PPO | Admitting: Pharmacist

## 2023-01-03 NOTE — Progress Notes (Unsigned)
    S:     No chief complaint on file.  32 y.o. female who presents for diabetes evaluation, education, and management. PMH is significant for T2DM, HTN, HLD, PCOS, .   Patient was referred and last seen by Primary Care Provider, Dr. Laural Benes, on 09/10/22. A1c 8.2% on 08/27/22 close to goal <7%. Patient was last seen by Pharmacy clinic on 11/09/22. At that visit, patient reported nausea with Trulicity 4.5 mg after she missed a week and then resumed at the higher dose. No medication changes were made. Patient had PCP visit scheduled for 9/5, but she no showed. Since then patient has been switched to Ozempic to see if she tolerates this better and has better BG control. Started with Ozempic 0.25 mg dose since she had been off Trulicity for a few weeks.  A1c today  Family/Social History:  Fhx: Father - diabetes Tobacco: Former Smoker  Current diabetes medications include: metformin 500 mg BID (intolerant to higher doses), Ozempic 0.25 mg weekly, Semglee 32 units daily Current hypertension medications include: lisinopril 10 mg daily Current hyperlipidemia medications include: none  Patient reports adherence to taking all medications as prescribed.   Insurance coverage: BCBS  Patient denies hypoglycemic events.  Reported home fasting blood sugars: has not checked as much recently  Reported 2 hour post-meal/random blood sugars: has not checked as much recently.  Patient denies nocturia (nighttime urination).  Patient denies neuropathy (nerve pain). Patient denies visual changes. Patient reports self foot exams.   Patient reported dietary habits:  -No changes since last visit  Patient-reported exercise habits: no changes since last visit   O:   Lab Results  Component Value Date   HGBA1C 8.2 (H) 08/27/2022   There were no vitals filed for this visit.  Lipid Panel     Component Value Date/Time   CHOL 190 02/20/2021 1524   TRIG 98 02/20/2021 1524   HDL 55 02/20/2021 1524    CHOLHDL 3.5 02/20/2021 1524   LDLCALC 117 (H) 02/20/2021 1524    Clinical Atherosclerotic Cardiovascular Disease (ASCVD): No  The ASCVD Risk score (Arnett DK, et al., 2019) failed to calculate for the following reasons:   The 2019 ASCVD risk score is only valid for ages 30 to 29   Patient is participating in a Managed Medicaid Plan:  No   A/P: Diabetes longstanding currently uncontrolled. Patient is able to verbalize appropriate hypoglycemia management plan. Medication adherence appears appropriate. Will have her continue her current regimen and follow-up with PCP 11/18/2022. -Continued current regimen.  -Patient educated on purpose, proper use, and potential adverse effects of Trulicity.  -Extensively discussed pathophysiology of diabetes, recommended lifestyle interventions, dietary effects on blood sugar control.  -Counseled on s/sx of and management of hypoglycemia.  -Next A1c anticipated 11/2022.   Written patient instructions provided. Patient verbalized understanding of treatment plan.  Total time in face to face counseling 30 minutes.    Follow-up:  Pharmacist in 1 month PCP clinic visit next week.   Jarrett Ables, PharmD PGY-1 Pharmacy Resident

## 2023-02-03 ENCOUNTER — Other Ambulatory Visit: Payer: Self-pay

## 2023-02-04 ENCOUNTER — Other Ambulatory Visit: Payer: Self-pay

## 2023-02-15 ENCOUNTER — Other Ambulatory Visit: Payer: Self-pay

## 2023-03-04 ENCOUNTER — Ambulatory Visit: Payer: BC Managed Care – PPO | Admitting: Pharmacist

## 2023-03-07 ENCOUNTER — Ambulatory Visit (HOSPITAL_COMMUNITY)
Admission: EM | Admit: 2023-03-07 | Discharge: 2023-03-07 | Disposition: A | Discharge status: Home / Self Care | Payer: BC Managed Care – PPO | Attending: Physician Assistant | Admitting: Physician Assistant

## 2023-03-07 ENCOUNTER — Encounter (HOSPITAL_COMMUNITY): Payer: Self-pay

## 2023-03-07 DIAGNOSIS — R234 Changes in skin texture: Secondary | ICD-10-CM | POA: Diagnosis not present

## 2023-03-07 MED ORDER — SULFAMETHOXAZOLE-TRIMETHOPRIM 800-160 MG PO TABS: 1.0000 | ORAL_TABLET | Freq: Two times a day (BID) | ORAL | 0 refills | Status: AC

## 2023-03-07 NOTE — ED Triage Notes (Signed)
left foot pain x 1 day. Patient states she is on her feet at work everyday and woke up in pain with pain in the left heel. States there is a line where the skin is cracking and dry. Unable to put much pressure on it. Patient is diabetic as well.

## 2023-03-07 NOTE — ED Provider Notes (Signed)
[facility]    CSN: [id number] Arrival date & time: 03/07/23  1437      History   Chief Complaint No chief complaint on file.   HPI Laura Ashley is a 32 y.o. female.   Patient presents with left heel pain with dry cracking skin that started about 1 day ago.  She reports upon waking this morning she could not bear weight due to pain.  She denies injury or trauma.  She reports calling her podiatrist and was advised to be seen in the urgent care today.  Patient is diabetic.  She is unsure of her last A1c.  I do see an A1c in June of 8.2.  She does not check her blood sugars regularly.  She has tried nothing for the symptoms.  She denies redness, swelling, drainage, fever, chills.    Past Medical History:  Diagnosis Date   Diabetes mellitus without complication (HCC)    Hypertension    Medical history non-contributory    PCOS (polycystic ovarian syndrome)     Patient Active Problem List   Diagnosis Date Noted   Hyperlipidemia associated with type 2 diabetes mellitus (HCC) 02/21/2021   Uncontrolled type 2 diabetes mellitus with microalbuminuria 10/27/2018   Elevated blood pressure reading 09/25/2018   Class 3 severe obesity due to excess calories with serious comorbidity and body mass index (BMI) of 45.0 to 49.9 in adult [facility]) 09/25/2018   History of PCOS 09/25/2018   Morbidly obese (HCC) 02/28/2014   Hypertension 02/28/2014   Dysfunctional uterine bleeding 02/28/2014    Past Surgical History:  Procedure Laterality Date   NO PAST SURGERIES      OB History     Gravida  2   Para      Term      Preterm      AB  2   Living  0      SAB  2   IAB      Ectopic      Multiple      Live Births               Home Medications    Prior to Admission medications   Medication Sig Start Date End Date Taking? Authorizing Provider  insulin glargine-yfgn (SEMGLEE) 100 UNIT/ML Pen Inject 32 Units into the skin daily. 07/27/22  Yes Marcine Matar, MD  Insulin Pen Needle 32G X 4 MM MISC Using daily. 05/26/22  Yes Georgian Co M, PA-C  lisinopril (ZESTRIL) 10 MG tablet Take 1 tablet (10 mg total) by mouth daily. 05/26/22  Yes Georgian Co M, PA-C  metFORMIN (GLUCOPHAGE) 500 MG tablet Take 1 tablet (500 mg total) by mouth 2 (two) times daily with a meal. 05/26/22  Yes McClung, Angela M, PA-C  Semaglutide,0.25 or 0.5MG /DOS, (OZEMPIC, 0.25 OR 0.5 MG/DOSE,) 2 MG/3ML SOPN Inject 0.25 mg into the skin once a week. 12/14/22  Yes Marcine Matar, MD  sulfamethoxazole-trimethoprim (BACTRIM DS) 800-160 MG tablet Take 1 tablet by mouth 2 (two) times daily for 5 days. 03/07/23 03/12/23 Yes Ward, Tylene Fantasia, PA-C  Blood Glucose Monitoring Suppl (ONETOUCH VERIO) w/Device KIT Use as instructed to check blood sugar up to 3 times daily. E11.69 09/27/22   Marcine Matar, MD  Continuous Blood Gluc Receiver (FREESTYLE LIBRE 2 READER) DEVI Use to check blood glucose continuously. E11.65 06/02/22   Marcine Matar, MD  Continuous Glucose Sensor (FREESTYLE LIBRE 2 SENSOR) MISC Use to check blood glucose continuously. Change sensors  once every 14 days. E11.65 06/02/22   Marcine Matar, MD  famotidine (PEPCID) 20 MG tablet Take 1 tablet (20 mg total) by mouth 2 (two) times daily. 11/19/20 02/17/21  Claiborne Rigg, NP  glucose blood (ONETOUCH VERIO) test strip Use as instructed to check blood sugar up to 3 times daily. E11.69 09/27/22   Marcine Matar, MD  OneTouch Delica Lancets 33G MISC Use as instructed to check blood sugar up to 3 times daily. 09/27/22   Marcine Matar, MD    Family History Family History  Problem Relation Age of Onset   Diabetes Father    Diabetes Other    Healthy Mother     Social History Social History   Tobacco Use   Smoking status: Former   Smokeless tobacco: Never  Advertising account planner   Vaping status: Never Used  Substance Use Topics   Alcohol use: Yes   Drug use: No     Allergies   Penicillins   Review of  Systems Review of Systems  Constitutional:  Negative for chills and fever.  HENT:  Negative for ear pain and sore throat.   Eyes:  Negative for pain and visual disturbance.  Respiratory:  Negative for cough and shortness of breath.   Cardiovascular:  Negative for chest pain and palpitations.  Gastrointestinal:  Negative for abdominal pain and vomiting.  Genitourinary:  Negative for dysuria and hematuria.  Musculoskeletal:  Negative for arthralgias and back pain.  Skin:  Positive for wound. Negative for color change and rash.  Neurological:  Negative for seizures and syncope.  All other systems reviewed and are negative.    Physical Exam Triage Vital Signs ED Triage Vitals  Encounter Vitals Group     BP 03/07/23 1653 129/87     Systolic BP Percentile --      Diastolic BP Percentile --      Pulse Rate 03/07/23 1653 74     Resp 03/07/23 1653 18     Temp 03/07/23 1653 98.2 F (36.8 C)     Temp Source 03/07/23 1653 Oral     SpO2 03/07/23 1653 98 %     Weight --      Height 03/07/23 1653 5\' 4"  (1.626 m)     Head Circumference --      Peak Flow --      Pain Score 03/07/23 1651 3     Pain Loc --      Pain Education --      Exclude from Growth Chart --    No data found.  Updated Vital Signs BP 129/87 (BP Location: Left Arm)   Pulse 74   Temp 98.2 F (36.8 C) (Oral)   Resp 18   Ht 5\' 4"  (1.626 m)   LMP 12/28/2022 (Approximate)   SpO2 98%   BMI 44.22 kg/m   Visual Acuity Right Eye Distance:   Left Eye Distance:   Bilateral Distance:    Right Eye Near:   Left Eye Near:    Bilateral Near:     Physical Exam Vitals and nursing note reviewed.  Constitutional:      General: She is not in acute distress.    Appearance: She is well-developed.  HENT:     Head: Normocephalic and atraumatic.  Eyes:     Conjunctiva/sclera: Conjunctivae normal.  Cardiovascular:     Rate and Rhythm: Normal rate and regular rhythm.     Heart sounds: No murmur heard. Pulmonary:  Effort: Pulmonary effort is normal. No respiratory distress.     Breath sounds: Normal breath sounds.  Abdominal:     Palpations: Abdomen is soft.     Tenderness: There is no abdominal tenderness.  Musculoskeletal:        General: No swelling.     Cervical back: Neck supple.  Skin:    General: Skin is warm and dry.     Capillary Refill: Capillary refill takes less than 2 seconds.     Comments: Left heel with dry thick skin with skin fissure that is superficial at this time.  No surrounding redness, swelling or drainage noted.  Neurological:     Mental Status: She is alert.  Psychiatric:        Mood and Affect: Mood normal.      UC Treatments / Results  Labs (all labs ordered are listed, but only abnormal results are displayed) Labs Reviewed - No data to display  EKG   Radiology No results found. Doing Procedures Procedures (including critical care time)  Medications Ordered in UC Medications - No data to display  Initial Impression / Assessment and Plan / UC Course  I have reviewed the triage vital signs and the nursing notes.  Pertinent labs & imaging results that were available during my care of the patient were reviewed by me and considered in my medical decision making (see chart for details).     Supportive care discussed.  Given diabetes status and podiatry's recommendation to be seen in urgent care today.  Will cover with an antibiotic.  At this time no clear infection noted.  Postop shoe given for comfort.  Advised to follow-up with podiatry if no improvement. Final Clinical Impressions(s) / UC Diagnoses   Final diagnoses:  Cracked skin on feet     Discharge Instructions      Take antibiotic as prescribed Keep area clean, recommend applying aquaphor If worsening or no improvement follow up with podiatry Watch your blood sugars closely.     ED Prescriptions     Medication Sig Dispense Auth. Provider   sulfamethoxazole-trimethoprim (BACTRIM DS)  800-160 MG tablet Take 1 tablet by mouth 2 (two) times daily for 5 days. 10 tablet Ward, Tylene Fantasia, PA-C      PDMP not reviewed this encounter.   Ward, Tylene Fantasia, PA-C 03/07/23 1721

## 2023-03-07 NOTE — Discharge Instructions (Addendum)
Take antibiotic as prescribed Keep area clean, recommend applying aquaphor If worsening or no improvement follow up with podiatry Watch your blood sugars closely.

## 2023-03-17 ENCOUNTER — Other Ambulatory Visit: Payer: Self-pay

## 2023-03-17 ENCOUNTER — Encounter: Payer: Self-pay | Admitting: Pharmacist

## 2023-03-17 ENCOUNTER — Ambulatory Visit: Payer: BC Managed Care – PPO | Attending: Internal Medicine | Admitting: Pharmacist

## 2023-03-17 DIAGNOSIS — E1169 Type 2 diabetes mellitus with other specified complication: Secondary | ICD-10-CM | POA: Diagnosis not present

## 2023-03-17 DIAGNOSIS — Z7985 Long-term (current) use of injectable non-insulin antidiabetic drugs: Secondary | ICD-10-CM | POA: Diagnosis not present

## 2023-03-17 DIAGNOSIS — Z794 Long term (current) use of insulin: Secondary | ICD-10-CM

## 2023-03-17 MED ORDER — INSULIN GLARGINE-YFGN 100 UNIT/ML ~~LOC~~ SOPN
32.0000 [IU] | PEN_INJECTOR | Freq: Every day | SUBCUTANEOUS | 1 refills | Status: DC
  Filled 2023-03-17: qty 9, 28d supply, fill #0

## 2023-03-17 MED ORDER — OZEMPIC (0.25 OR 0.5 MG/DOSE) 2 MG/3ML ~~LOC~~ SOPN
0.5000 mg | PEN_INJECTOR | SUBCUTANEOUS | 1 refills | Status: DC
  Filled 2023-03-17: qty 3, 28d supply, fill #0

## 2023-03-17 NOTE — Progress Notes (Signed)
    S:     No chief complaint on file.  33 y.o. female who presents for diabetes evaluation, education, and management. PMH is significant for T2DM, HTN, HLD, PCOS, .   Patient was referred and last seen by Primary Care Provider, Dr. Vicci, on 09/10/22. Patient was last seen by Pharmacy clinic on 11/09/2022. Since that visit, Trulicity  was changed to Ozempic  as pt could not tolerate the higher doses of Trulicity  and A1c was not improving. She was supposed to follow-up on 01/03/23 with us  but no-showed. Additionally, she no-showed her appt with Dr. Vicci on 03/04/2023.   Today, patient arrives in good spirits and presents without any assistance. She is tolerating the Ozempic  well - denies vomiting, abdominal pain, or vision changes.  She is adherent to Semglee  at 32 units daily but is not taking metformin . She experiences GI side effects with metformin  and would prefer not to take it.   Family/Social History:  Fhx: Father - diabetes Tobacco: Former Smoker  Current diabetes medications include: metformin  500 mg BID (not taking), Ozempic  0.25mg  weekly, Semglee  32 units daily Patient reports adherence to taking all medications as prescribed.   Insurance coverage: BCBS  Patient denies hypoglycemic events.  Reported home blood sugars:160s-240s No CGM in place. No GM with her today.   Patient denies nocturia (nighttime urination).  Patient denies neuropathy (nerve pain). Patient denies visual changes. Patient reports self foot exams.   Patient reported dietary habits:  -No changes since last visit  Patient-reported exercise habits: no changes since last visit   O:   Lab Results  Component Value Date   HGBA1C 8.2 (H) 08/27/2022   There were no vitals filed for this visit.  Lipid Panel     Component Value Date/Time   CHOL 190 02/20/2021 1524   TRIG 98 02/20/2021 1524   HDL 55 02/20/2021 1524   CHOLHDL 3.5 02/20/2021 1524   LDLCALC 117 (H) 02/20/2021 1524    Clinical  Atherosclerotic Cardiovascular Disease (ASCVD): No  The ASCVD Risk score (Arnett DK, et al., 2019) failed to calculate for the following reasons:   The 2019 ASCVD risk score is only valid for ages 9 to 75   Patient is participating in a Managed Medicaid Plan:  No   A/P: Diabetes longstanding currently uncontrolled. Patient is able to verbalize appropriate hypoglycemia management plan. She is asymptomatic from a hyper- and a hypoglycemic standpoint. Medication adherence appears appropriate. Will have her increase Ozempic  to 0.5 mg weekly and continue same dose of Semglee  for now.  -Increased dose of Ozempic  to 0.5 mg weekly.  -Continue Semglee  32u daily.  -Stop metformin  d/t GI side effects.  -Patient educated on purpose, proper use, and potential adverse effects of Trulicity .  -Extensively discussed pathophysiology of diabetes, recommended lifestyle interventions, dietary effects on blood sugar control.  -Counseled on s/sx of and management of hypoglycemia.  -CMP14+eGFR, Lipid, A1c today.   Written patient instructions provided. Patient verbalized understanding of treatment plan.  Total time in face to face counseling 30 minutes.    Follow-up:  Pharmacist in 2 months. PCP in 1 month.  Herlene Fleeta Morris, PharmD, JAQUELINE, CPP Clinical Pharmacist [facility] [phone]

## 2023-03-18 ENCOUNTER — Other Ambulatory Visit: Payer: Self-pay

## 2023-03-18 LAB — LIPID PANEL
Chol/HDL Ratio: 4.5 {ratio} — ABNORMAL HIGH (ref 0.0–4.4)
Cholesterol, Total: 197 mg/dL (ref 100–199)
HDL: 44 mg/dL (ref >39)
LDL Chol Calc (NIH): 124 mg/dL — ABNORMAL HIGH (ref 0–99)
Triglycerides: 166 mg/dL — ABNORMAL HIGH (ref 0–149)
VLDL Cholesterol Cal: 29 mg/dL (ref 5–40)

## 2023-03-18 LAB — CMP14+EGFR
ALT: 17 [IU]/L (ref 0–32)
AST: 14 [IU]/L (ref 0–40)
Albumin: 4.1 g/dL (ref 4.0–5.0)
Alkaline Phosphatase: 84 [IU]/L (ref 44–121)
BUN/Creatinine Ratio: 14 (ref 9–23)
BUN: 10 mg/dL (ref 6–20)
Bilirubin Total: 0.4 mg/dL (ref 0.0–1.2)
CO2: 25 mmol/L (ref 20–29)
Calcium: 9.8 mg/dL (ref 8.7–10.2)
Chloride: 98 mmol/L (ref 96–106)
Creatinine, Ser: 0.69 mg/dL (ref 0.57–1.00)
Globulin, Total: 2.4 g/dL (ref 1.5–4.5)
Glucose: 378 mg/dL — ABNORMAL HIGH (ref 70–99)
Potassium: 4.5 mmol/L (ref 3.5–5.2)
Sodium: 135 mmol/L (ref 134–144)
Total Protein: 6.5 g/dL (ref 6.0–8.5)
eGFR: 120 mL/min/{1.73_m2} (ref >59)

## 2023-03-18 LAB — HEMOGLOBIN A1C
Est. average glucose Bld gHb Est-mCnc: 306 mg/dL
Hgb A1c MFr Bld: 12.3 % — ABNORMAL HIGH (ref 4.8–5.6)

## 2023-04-06 ENCOUNTER — Encounter (HOSPITAL_COMMUNITY): Payer: Self-pay | Admitting: Emergency Medicine

## 2023-04-06 ENCOUNTER — Ambulatory Visit (HOSPITAL_COMMUNITY)
Admission: EM | Admit: 2023-04-06 | Discharge: 2023-04-06 | Disposition: A | Discharge status: Home / Self Care | Payer: BC Managed Care – PPO | Attending: Emergency Medicine | Admitting: Emergency Medicine

## 2023-04-06 DIAGNOSIS — E282 Polycystic ovarian syndrome: Secondary | ICD-10-CM

## 2023-04-06 DIAGNOSIS — S39012A Strain of muscle, fascia and tendon of lower back, initial encounter: Secondary | ICD-10-CM

## 2023-04-06 MED ORDER — KETOROLAC TROMETHAMINE 30 MG/ML IJ SOLN
INTRAMUSCULAR | Status: AC
  Filled 2023-04-06: qty 1

## 2023-04-06 MED ORDER — KETOROLAC TROMETHAMINE 30 MG/ML IJ SOLN
30.0000 mg | Freq: Once | INTRAMUSCULAR | Status: AC
  Administered 2023-04-06: 30 mg via INTRAMUSCULAR

## 2023-04-06 MED ORDER — METHOCARBAMOL 500 MG PO TABS: 500.0000 mg | ORAL_TABLET | Freq: Two times a day (BID) | ORAL | 0 refills | Status: DC

## 2023-04-06 NOTE — ED Triage Notes (Signed)
Patient presents with low back pain with radiation down both legs x 1 day. Patient denies injury/trauma.

## 2023-04-06 NOTE — ED Provider Notes (Signed)
[facility]    CSN: [id number] Arrival date & time: 04/06/23  1333      History   Chief Complaint Chief Complaint  Patient presents with   Back Pain    HPI Laura Ashley is a 33 y.o. female.   Patient presents with right lower back pain with radiation down right leg that began last night.  Denies any known injury.  Denies urinary symptoms.   Back Pain Associated symptoms: no abdominal pain, no dysuria, no numbness and no weakness     Past Medical History:  Diagnosis Date   Diabetes mellitus without complication (HCC)    Hypertension    Medical history non-contributory    PCOS (polycystic ovarian syndrome)     Patient Active Problem List   Diagnosis Date Noted   PCOS (polycystic ovarian syndrome)    Hyperlipidemia associated with type 2 diabetes mellitus (HCC) 02/21/2021   Uncontrolled type 2 diabetes mellitus with microalbuminuria 10/27/2018   Elevated blood pressure reading 09/25/2018   Class 3 severe obesity due to excess calories with serious comorbidity and body mass index (BMI) of 45.0 to 49.9 in adult (HCC) 09/25/2018   History of PCOS 09/25/2018   Morbidly obese (HCC) 02/28/2014   Hypertension 02/28/2014   Dysfunctional uterine bleeding 02/28/2014    Past Surgical History:  Procedure Laterality Date   NO PAST SURGERIES      OB History     Gravida  2   Para      Term      Preterm      AB  2   Living  0      SAB  2   IAB      Ectopic      Multiple      Live Births               Home Medications    Prior to Admission medications   Medication Sig Start Date End Date Taking? Authorizing Provider  insulin glargine-yfgn (SEMGLEE) 100 UNIT/ML Pen Inject 32 Units into the skin daily. 03/17/23  Yes Marcine Matar, MD  lisinopril (ZESTRIL) 10 MG tablet Take 1 tablet (10 mg total) by mouth daily. 05/26/22  Yes McClung, Marzella Schlein, PA-C  methocarbamol (ROBAXIN) 500 MG tablet Take 1 tablet (500 mg total) by mouth 2 (two)  times daily. 04/06/23  Yes Susann Givens, Shatana Saxton A, NP  Semaglutide,0.25 or 0.5MG /DOS, (OZEMPIC, 0.25 OR 0.5 MG/DOSE,) 2 MG/3ML SOPN Inject 0.5 mg into the skin once a week. 03/17/23  Yes Marcine Matar, MD  Blood Glucose Monitoring Suppl (ONETOUCH VERIO) w/Device KIT Use as instructed to check blood sugar up to 3 times daily. E11.69 09/27/22   Marcine Matar, MD  Continuous Blood Gluc Receiver (FREESTYLE LIBRE 2 READER) DEVI Use to check blood glucose continuously. E11.65 06/02/22   Marcine Matar, MD  Continuous Glucose Sensor (FREESTYLE LIBRE 2 SENSOR) MISC Use to check blood glucose continuously. Change sensors once every 14 days. E11.65 06/02/22   Marcine Matar, MD  glucose blood (ONETOUCH VERIO) test strip Use as instructed to check blood sugar up to 3 times daily. E11.69 09/27/22   Marcine Matar, MD  Insulin Pen Needle 32G X 4 MM MISC Using daily. 05/26/22   Anders Simmonds, PA-C  OneTouch Delica Lancets 33G MISC Use as instructed to check blood sugar up to 3 times daily. 09/27/22   Marcine Matar, MD    Family History Family History  Problem Relation Age  of Onset   Diabetes Father    Diabetes Other    Healthy Mother     Social History Social History   Tobacco Use   Smoking status: Former   Smokeless tobacco: Never  Advertising account planner   Vaping status: Never Used  Substance Use Topics   Alcohol use: Yes    Comment: social   Drug use: No     Allergies   Penicillins   Review of Systems Review of Systems  Gastrointestinal:  Negative for abdominal pain.  Genitourinary:  Negative for dysuria, flank pain and hematuria.  Musculoskeletal:  Positive for back pain.  Neurological:  Negative for weakness and numbness.     Physical Exam Triage Vital Signs ED Triage Vitals  Encounter Vitals Group     BP 04/06/23 1518 126/87     Systolic BP Percentile --      Diastolic BP Percentile --      Pulse Rate 04/06/23 1518 85     Resp 04/06/23 1518 20     Temp 04/06/23  1518 97.7 F (36.5 C)     Temp Source 04/06/23 1518 Oral     SpO2 04/06/23 1518 96 %     Weight --      Height --      Head Circumference --      Peak Flow --      Pain Score 04/06/23 1514 0     Pain Loc --      Pain Education --      Exclude from Growth Chart --    No data found.  Updated Vital Signs BP 126/87 (BP Location: Right Arm)   Pulse 85   Temp 97.7 F (36.5 C) (Oral)   Resp 20   LMP 12/28/2022 (Approximate)   SpO2 96%   Visual Acuity Right Eye Distance:   Left Eye Distance:   Bilateral Distance:    Right Eye Near:   Left Eye Near:    Bilateral Near:     Physical Exam Vitals and nursing note reviewed.  Constitutional:      General: She is awake. She is not in acute distress.    Appearance: Normal appearance. She is well-developed and well-groomed. She is not ill-appearing.  Musculoskeletal:     Cervical back: Normal.     Thoracic back: Normal.     Lumbar back: Tenderness present. No swelling, edema, deformity or bony tenderness.     Comments: Moderate tenderness upon palpation to right lower back.  Neurological:     Mental Status: She is alert.  Psychiatric:        Behavior: Behavior is cooperative.      UC Treatments / Results  Labs (all labs ordered are listed, but only abnormal results are displayed) Labs Reviewed - No data to display  EKG   Radiology No results found.  Procedures Procedures (including critical care time)  Medications Ordered in UC Medications  ketorolac (TORADOL) 30 MG/ML injection 30 mg (30 mg Intramuscular Given 04/06/23 1603)    Initial Impression / Assessment and Plan / UC Course  I have reviewed the triage vital signs and the nursing notes.  Pertinent labs & imaging results that were available during my care of the patient were reviewed by me and considered in my medical decision making (see chart for details).     Patient presented with right lower back pain with radiation down right leg that began last  night.  Denies any known injury.  Denies any urinary symptoms.  Upon assessment patient is moderately tender upon palpation to right lower back, without tenderness to hip or leg.  Denies numbness and tingling.  Given Toradol shot in clinic.  Prescribed Robaxin as needed for pain and muscle spasms.  Discussed follow-up and return precautions. Final Clinical Impressions(s) / UC Diagnoses   Final diagnoses:  Strain of lumbar region, initial encounter     Discharge Instructions      You can take Robaxin twice daily as needed for pain and muscle spasms.  This can make you drowsy so do not drive or work while taking.  You can alternate between Tylenol and ibuprofen as needed for pain.  You can also alternate between ice and heat as needed for pain.  I have attached orthopedic doctor you can follow-up with if pain persists.  Return here as needed.    ED Prescriptions     Medication Sig Dispense Auth. Provider   methocarbamol (ROBAXIN) 500 MG tablet Take 1 tablet (500 mg total) by mouth 2 (two) times daily. 20 tablet Wynonia Lawman A, NP      PDMP not reviewed this encounter.   Wynonia Lawman A, NP 04/06/23 [phone]

## 2023-04-06 NOTE — Discharge Instructions (Signed)
You can take Robaxin twice daily as needed for pain and muscle spasms.  This can make you drowsy so do not drive or work while taking.  You can alternate between Tylenol and ibuprofen as needed for pain.  You can also alternate between ice and heat as needed for pain.  I have attached orthopedic doctor you can follow-up with if pain persists.  Return here as needed.

## 2023-04-19 ENCOUNTER — Ambulatory Visit: Payer: BC Managed Care – PPO | Attending: Internal Medicine | Admitting: Internal Medicine

## 2023-04-19 ENCOUNTER — Other Ambulatory Visit: Payer: Self-pay

## 2023-04-19 ENCOUNTER — Encounter: Payer: Self-pay | Admitting: Internal Medicine

## 2023-04-19 DIAGNOSIS — E785 Hyperlipidemia, unspecified: Secondary | ICD-10-CM

## 2023-04-19 DIAGNOSIS — Z7985 Long-term (current) use of injectable non-insulin antidiabetic drugs: Secondary | ICD-10-CM

## 2023-04-19 DIAGNOSIS — E1169 Type 2 diabetes mellitus with other specified complication: Secondary | ICD-10-CM

## 2023-04-19 DIAGNOSIS — E1159 Type 2 diabetes mellitus with other circulatory complications: Secondary | ICD-10-CM | POA: Diagnosis not present

## 2023-04-19 DIAGNOSIS — I152 Hypertension secondary to endocrine disorders: Secondary | ICD-10-CM

## 2023-04-19 DIAGNOSIS — J069 Acute upper respiratory infection, unspecified: Secondary | ICD-10-CM | POA: Diagnosis not present

## 2023-04-19 DIAGNOSIS — Z6841 Body Mass Index (BMI) 40.0 and over, adult: Secondary | ICD-10-CM

## 2023-04-19 DIAGNOSIS — E66813 Obesity, class 3: Secondary | ICD-10-CM

## 2023-04-19 LAB — POCT FLU A/B STATUS
Influenza A, POC: NEGATIVE
Influenza B, POC: NEGATIVE

## 2023-04-19 MED ORDER — BENZONATATE 100 MG PO CAPS
100.0000 mg | ORAL_CAPSULE | Freq: Three times a day (TID) | ORAL | 0 refills | Status: DC | PRN
  Filled 2023-04-19: qty 20, 7d supply, fill #0

## 2023-04-19 MED ORDER — ATORVASTATIN CALCIUM 10 MG PO TABS
10.0000 mg | ORAL_TABLET | Freq: Every day | ORAL | 1 refills | Status: DC
  Filled 2023-04-19: qty 30, 30d supply, fill #0
  Filled 2023-05-20: qty 30, 30d supply, fill #1

## 2023-04-19 MED ORDER — SEMAGLUTIDE (1 MG/DOSE) 4 MG/3ML ~~LOC~~ SOPN
1.0000 mg | PEN_INJECTOR | SUBCUTANEOUS | 3 refills | Status: DC
  Filled 2023-04-19: qty 3, 28d supply, fill #0

## 2023-04-19 NOTE — Progress Notes (Signed)
 Patient ID: Laura Ashley, female    DOB: [birth year]  MRN: [id number]  CC: Diabetes (DM f/u. [place], chest pain due to cough, runny nose X2 days/No to all vax. Yes to pap. )   Subjective: Laura Ashley is a 33 y.o. female who presents for chronic ds management. Her concerns today include:  Patient with history of DM type II microalbumin, HTN, obesity, microalbuminuria, PCOS.   Discussed the use of AI scribe software for clinical note transcription with the patient, who gave verbal consent to proceed.  History of Present Illness   The patient, with a history of diabetes and hypertension, presents with acute respiratory symptoms of cough, congestion, and runny nose that started yesterday. The cough was initially dry but has since become productive with clear mucus. She also reports a sore throat. She denies fever and shortness of breath. She has not been around anyone who has been sick and has not had a COVID test. She has not received a flu shot or a COVID vaccine in the past six months. She has been managing her symptoms with cough drops and hot tea with lemon.  DM: Lab Results  Component Value Date   HGBA1C 12.3 (H) 03/17/2023   she has seen our clinical pharmacist several times since last visit with me in June. She was previously on Trulicity  but has since switched to Ozempic  0.5mg ; has been on this dose for a month. She reports that the Ozempic  has decreased her appetite and she has not experienced any side effects such as diarrhea, abdominal pain, or vomiting. She has also lost 14 pounds since June. She is also on Semglee  32 units for her diabetes and has discontinued metformin  due to gastrointestinal side effects. She checks her blood sugars twice a day, before breakfast and after eating, and reports readings before BF between 150-200.  HTN: she is on Lisinopril  10mg . She does not have a device to check her blood pressure at home. Her blood pressure today was 128/84. She reports  that she is limiting her salt intake.     HL: Lipid profile done earlier this month revealed LDL of 124 with goal being less than 70.  She is currently not on statin therapy.   HM: Patient declines flu shot and pneumonia vaccines. Patient Active Problem List   Diagnosis Date Noted   PCOS (polycystic ovarian syndrome)    Hyperlipidemia associated with type 2 diabetes mellitus (HCC) 02/21/2021   Uncontrolled type 2 diabetes mellitus with microalbuminuria 10/27/2018   Elevated blood pressure reading 09/25/2018   Class 3 severe obesity due to excess calories with serious comorbidity and body mass index (BMI) of 45.0 to 49.9 in adult [facility]) 09/25/2018   History of PCOS 09/25/2018   Morbidly obese (HCC) 02/28/2014   Hypertension 02/28/2014   Dysfunctional uterine bleeding 02/28/2014     Current Outpatient Medications on File Prior to Visit  Medication Sig Dispense Refill   Blood Glucose Monitoring Suppl (ONETOUCH VERIO) w/Device KIT Use as instructed to check blood sugar up to 3 times daily. E11.69 1 kit 0   Continuous Blood Gluc Receiver (FREESTYLE LIBRE 2 READER) DEVI Use to check blood glucose continuously. E11.65 1 each 0   Continuous Glucose Sensor (FREESTYLE LIBRE 2 SENSOR) MISC Use to check blood glucose continuously. Change sensors once every 14 days. E11.65 2 each 3   glucose blood (ONETOUCH VERIO) test strip Use as instructed to check blood sugar up to 3 times daily. E11.69 100 each 3  insulin  glargine-yfgn (SEMGLEE ) 100 UNIT/ML Pen Inject 32 Units into the skin daily. 15 mL 1   Insulin  Pen Needle 32G X 4 MM MISC Using daily. 100 each 1   lisinopril  (ZESTRIL ) 10 MG tablet Take 1 tablet (10 mg total) by mouth daily. 90 tablet 1   methocarbamol  (ROBAXIN ) 500 MG tablet Take 1 tablet (500 mg total) by mouth 2 (two) times daily. 20 tablet 0   OneTouch Delica Lancets 33G MISC Use as instructed to check blood sugar up to 3 times daily. 100 each 6   Current Facility-Administered  Medications on File Prior to Visit  Medication Dose Route Frequency Provider Last Rate Last Admin   sodium chloride  0.9 % bolus 1,000 mL  1,000 mL Intravenous Once [place] Jon HERO, PA-C   Stopped at 05/26/22 1620    Allergies  Allergen Reactions   Penicillins Rash    Has patient had a PCN reaction causing immediate rash, facial/tongue/throat swelling, SOB or lightheadedness with hypotension: Yes Has patient had a PCN reaction causing severe rash involving mucus membranes or skin necrosis: Yes Has patient had a PCN reaction that required hospitalization: No Has patient had a PCN reaction occurring within the last 10 years: No If all of the above answers are NO, then may proceed with Cephalosporin use.    Social History   Socioeconomic History   Marital status: Single    Spouse name: Not on file   Number of children: 0   Years of education: Not on file   Highest education level: Associate degree: academic program  Occupational History   Not on file  Tobacco Use   Smoking status: Former   Smokeless tobacco: Never  Vaping Use   Vaping status: Never Used  Substance and Sexual Activity   Alcohol use: Yes    Comment: social   Drug use: No   Sexual activity: Yes    Birth control/protection: None  Other Topics Concern   Not on file  Social History Narrative   Not on file   Social Drivers of Health   Financial Resource Strain: Low Risk  (06/29/2022)   Overall Financial Resource Strain (CARDIA)    Difficulty of Paying Living Expenses: Not very hard  Food Insecurity: No Food Insecurity (04/19/2023)   Hunger Vital Sign    Worried About Running Out of Food in the Last Year: Never true    Ran Out of Food in the Last Year: Never true  Transportation Needs: No Transportation Needs (04/19/2023)   PRAPARE - Administrator, Civil Service (Medical): No    Lack of Transportation (Non-Medical): No  Physical Activity: Sufficiently Active (06/29/2022)   Exercise Vital Sign     Days of Exercise per Week: 4 days    Minutes of Exercise per Session: 40 min  Stress: No Stress Concern Present (06/29/2022)   [employer] of Occupational Health - Occupational Stress Questionnaire    Feeling of Stress : Only a little  Social Connections: Socially Isolated (04/19/2023)   Social Connection and Isolation Panel [NHANES]    Frequency of Communication with Friends and Family: Twice a week    Frequency of Social Gatherings with Friends and Family: Three times a week    Attends Religious Services: Never    Active Member of Clubs or Organizations: No    Attends Banker Meetings: Never    Marital Status: Never married  Intimate Partner Violence: Not At Risk (04/19/2023)   Humiliation, Afraid, Rape, and Kick questionnaire  Fear of Current or Ex-Partner: No    Emotionally Abused: No    Physically Abused: No    Sexually Abused: No    Family History  Problem Relation Age of Onset   Diabetes Father    Diabetes Other    Healthy Mother     Past Surgical History:  Procedure Laterality Date   NO PAST SURGERIES      ROS: Review of Systems Negative except as stated above  PHYSICAL EXAM: BP 128/84   Pulse 100   Temp 99.7 F (37.6 C) (Oral)   Ht 5' 4 (1.626 m)   Wt 243 lb (110.2 kg)   SpO2 96%   BMI 41.71 kg/m   Wt Readings from Last 3 Encounters:  04/19/23 243 lb (110.2 kg)  08/27/22 257 lb 9.6 oz (116.8 kg)  07/27/22 252 lb 3.2 oz (114.4 kg)    Physical Exam  General appearance - alert, well appearing, obese young African-American female and in no distress.  She has mild audible congestion Mental status - normal mood, behavior, speech, dress, motor activity, and thought processes Nose - normal and patent, no erythema, discharge or polyps Mouth - mucous membranes moist, pharynx normal without lesions Neck - supple, no significant adenopathy Chest - clear to auscultation, no wheezes, rales or rhonchi, symmetric air entry Heart - normal rate,  regular rhythm, normal S1, S2, no murmurs, rubs, clicks or gallops Extremities - peripheral pulses normal, no pedal edema, no clubbing or cyanosis  Results for orders placed or performed in visit on 04/19/23  POCT Flu A & B Status   Collection Time: 04/19/23 12:24 PM  Result Value Ref Range   Influenza A, POC Negative Negative   Influenza B, POC Negative Negative       Latest Ref Rng & Units 03/17/2023    3:55 PM 08/27/2022   11:49 AM 03/23/2021    4:02 PM  CMP  Glucose 70 - 99 mg/dL 621  818  668   BUN 6 - 20 mg/dL 10  10  10    Creatinine 0.57 - 1.00 mg/dL 9.30  9.37  9.32   Sodium 134 - 144 mmol/L 135  136  131   Potassium 3.5 - 5.2 mmol/L 4.5  4.6  4.3   Chloride 96 - 106 mmol/L 98  99  98   CO2 20 - 29 mmol/L 25  23  24    Calcium  8.7 - 10.2 mg/dL 9.8  9.6  9.5   Total Protein 6.0 - 8.5 g/dL 6.5  7.0  7.8   Total Bilirubin 0.0 - 1.2 mg/dL 0.4  0.4  1.5   Alkaline Phos 44 - 121 IU/L 84  73  87   AST 0 - 40 IU/L 14  16  14    ALT 0 - 32 IU/L 17  18  18     Lipid Panel     Component Value Date/Time   CHOL 197 03/17/2023 1555   TRIG 166 (H) 03/17/2023 1555   HDL 44 03/17/2023 1555   CHOLHDL 4.5 (H) 03/17/2023 1555   LDLCALC 124 (H) 03/17/2023 1555    CBC    Component Value Date/Time   WBC 12.1 (H) 03/23/2021 1602   RBC 5.18 (H) 03/23/2021 1602   HGB 16.0 (H) 03/23/2021 1602   HGB 15.5 02/20/2021 1524   HCT 44.3 03/23/2021 1602   HCT 44.9 02/20/2021 1524   PLT 373 03/23/2021 1602   PLT 431 02/20/2021 1524   MCV 85.5 03/23/2021 1602  MCV 88 02/20/2021 1524   MCH 30.9 03/23/2021 1602   MCHC 36.1 (H) 03/23/2021 1602   RDW 11.2 (L) 03/23/2021 1602   RDW 11.5 (L) 02/20/2021 1524   LYMPHSABS 1.5 03/23/2021 1602   MONOABS 0.8 03/23/2021 1602   EOSABS 0.0 03/23/2021 1602   BASOSABS 0.0 03/23/2021 1602    ASSESSMENT AND PLAN: 1. Type 2 diabetes mellitus with morbid obesity (HCC) (Primary) Reported fasting blood sugars not at goal. I recommend increasing Ozempic  to 1  mg once a week.  Patient to watch out and report any significant side effects like vomiting, severe diarrhea or abdominal pain.  Continue Semglee  32 units daily. - Semaglutide , 1 MG/DOSE, 4 MG/3ML SOPN; Inject 1 mg as directed once a week.  Dispense: 3 mL; Refill: 3  2. Hyperlipidemia associated with type 2 diabetes mellitus (HCC) Discussed putting her on statin therapy to decrease cardiovascular risk.  Went over potential side effects of the medication including effects on the liver and muscle cramps/aches.  Recent LFTs were normal.  Patient agreeable to starting the medication.  She is currently not sexually active.  Advised use of protection if and when she becomes sexually active.  Advised to let us  know if and when she decides she wants to become pregnant as we would need to stop statin and lisinopril . - atorvastatin  (LIPITOR) 10 MG tablet; Take 1 tablet (10 mg total) by mouth daily.  Dispense: 90 tablet; Refill: 1  3. Hypertension associated with type 2 diabetes mellitus (HCC) Close to goal of 130/80 or lower.  DASH diet encouraged.  Advised her to check blood pressure at work at least once a week and record the readings.  Bring readings with her on next visit.  Continue lisinopril  10 mg daily.  4. Viral upper respiratory tract infection Point-of-care flu screening was negative. Will treat symptomatically.  Given some Tessalon  Perles to use for cough.  Recommend Vicks vapor rub to help decrease congestion.  We will keep her out of work the rest of this week.  Note given for work. - Novel Coronavirus, NAA ([facility]) - POCT Flu A & B Status - benzonatate  (TESSALON  PERLES) 100 MG capsule; Take 1 capsule (100 mg total) by mouth 3 (three) times daily as needed.  Dispense: 20 capsule; Refill: 0    Patient was given the opportunity to ask questions.  Patient verbalized understanding of the plan and was able to repeat key elements of the plan.   This documentation was completed using Social research officer, government.  Any transcriptional errors are unintentional.  Orders Placed This Encounter  Procedures   Novel Coronavirus, NAA ([facility])   POCT Flu A & B Status     Requested Prescriptions   Signed Prescriptions Disp Refills   Semaglutide , 1 MG/DOSE, 4 MG/3ML SOPN 3 mL 3    Sig: Inject 1 mg as directed once a week.   atorvastatin  (LIPITOR) 10 MG tablet 90 tablet 1    Sig: Take 1 tablet (10 mg total) by mouth daily.   benzonatate  (TESSALON  PERLES) 100 MG capsule 20 capsule 0    Sig: Take 1 capsule (100 mg total) by mouth 3 (three) times daily as needed.    Return in about 6 weeks (around 05/31/2023) for PAP.  Barnie Louder, MD, FACP

## 2023-04-21 ENCOUNTER — Encounter: Payer: Self-pay | Admitting: Internal Medicine

## 2023-04-21 LAB — NOVEL CORONAVIRUS, NAA: SARS-CoV-2, NAA: NOT DETECTED

## 2023-05-17 ENCOUNTER — Ambulatory Visit: Payer: Self-pay | Attending: Internal Medicine | Admitting: Pharmacist

## 2023-05-17 ENCOUNTER — Other Ambulatory Visit: Payer: Self-pay

## 2023-05-17 ENCOUNTER — Encounter: Payer: Self-pay | Admitting: Pharmacist

## 2023-05-17 DIAGNOSIS — Z7985 Long-term (current) use of injectable non-insulin antidiabetic drugs: Secondary | ICD-10-CM

## 2023-05-17 DIAGNOSIS — E1169 Type 2 diabetes mellitus with other specified complication: Secondary | ICD-10-CM

## 2023-05-17 DIAGNOSIS — Z794 Long term (current) use of insulin: Secondary | ICD-10-CM | POA: Diagnosis not present

## 2023-05-17 MED ORDER — SEMAGLUTIDE (2 MG/DOSE) 8 MG/3ML ~~LOC~~ SOPN
2.0000 mg | PEN_INJECTOR | SUBCUTANEOUS | 1 refills | Status: DC
  Filled 2023-05-17: qty 3, 28d supply, fill #0
  Filled 2023-07-01: qty 3, 28d supply, fill #1
  Filled 2023-08-05: qty 3, 28d supply, fill #2
  Filled 2023-09-22: qty 3, 28d supply, fill #3

## 2023-05-17 NOTE — Progress Notes (Signed)
    S:     No chief complaint on file.  33 y.o. female who presents for diabetes evaluation, education, and management. PMH is significant for T2DM, HTN, HLD, PCOS, .   Patient was referred and last seen by Primary Care Provider, Dr. Laural Benes, on 04/19/23. Pharmacy last saw her on 03/17/2023. At her visit with Dr. Laural Benes, Ozempic dose increased to 1mg  weekly.   Today, patient arrives in good spirits and presents without any assistance. She is tolerating the Ozempic well - denies vomiting, abdominal pain, or vision changes.  She is adherent to [facility] at 32 units daily.  Family/Social History:  Fhx: Father - diabetes Tobacco: Former Smoker  Current diabetes medications include: Ozempic 1mg  weekly, Semglee 32 units daily Patient reports adherence to taking all medications as prescribed.   Insurance coverage: BCBS  Patient denies hypoglycemic events.  Reported home blood sugars: has not checked in a while  Patient denies nocturia (nighttime urination).  Patient denies neuropathy (nerve pain). Patient denies visual changes. Patient reports self foot exams.   Patient reported dietary habits:  -No changes since last visit  Patient-reported exercise habits: no changes since last visit   O:   Lab Results  Component Value Date   HGBA1C 12.3 (H) 03/17/2023   There were no vitals filed for this visit.  Lipid Panel     Component Value Date/Time   CHOL 197 03/17/2023 1555   TRIG 166 (H) 03/17/2023 1555   HDL 44 03/17/2023 1555   CHOLHDL 4.5 (H) 03/17/2023 1555   LDLCALC 124 (H) 03/17/2023 1555    Clinical Atherosclerotic Cardiovascular Disease (ASCVD): No  The ASCVD Risk score (Arnett DK, et al., 2019) failed to calculate for the following reasons:   The 2019 ASCVD risk score is only valid for ages 22 to 64   Patient is participating in a Managed Medicaid Plan:  No   A/P: Diabetes longstanding currently uncontrolled. Patient is able to verbalize appropriate hypoglycemia  management plan. She is asymptomatic from a hyper- and a hypoglycemic standpoint. Medication adherence appears appropriate. Will have her increase Ozempic to 2mg  weekly and continue same dose of Semglee for now. I cannot increase the insulin dose at this time without home glucose levels.  -Increased dose of Ozempic to 2 mg weekly.  -Continue Semglee 32u daily.  -Patient educated on purpose, proper use, and potential adverse effects of Trulicity.  -Extensively discussed pathophysiology of diabetes, recommended lifestyle interventions, dietary effects on blood sugar control.  -Counseled on s/sx of and management of hypoglycemia.  -Next A1c anticipated 06/2023.  Written patient instructions provided. Patient verbalized understanding of treatment plan.  Total time in face to face counseling 30 minutes.    Follow-up:  Pharmacist in 1 month. PCP 05/31/2023.  Butch Penny, PharmD, Patsy [place], CPP Clinical Pharmacist [facility] [phone]

## 2023-05-18 ENCOUNTER — Other Ambulatory Visit: Payer: Self-pay

## 2023-05-20 ENCOUNTER — Other Ambulatory Visit: Payer: Self-pay

## 2023-05-31 ENCOUNTER — Ambulatory Visit: Payer: BC Managed Care – PPO | Attending: Internal Medicine | Admitting: Internal Medicine

## 2023-05-31 ENCOUNTER — Encounter: Payer: Self-pay | Admitting: Internal Medicine

## 2023-05-31 ENCOUNTER — Other Ambulatory Visit: Payer: Self-pay

## 2023-05-31 VITALS — BP 121/85 | HR 81 | Temp 97.9°F | Ht 64.0 in | Wt 236.0 lb

## 2023-05-31 DIAGNOSIS — E785 Hyperlipidemia, unspecified: Secondary | ICD-10-CM | POA: Diagnosis not present

## 2023-05-31 DIAGNOSIS — E119 Type 2 diabetes mellitus without complications: Secondary | ICD-10-CM

## 2023-05-31 DIAGNOSIS — I152 Hypertension secondary to endocrine disorders: Secondary | ICD-10-CM

## 2023-05-31 DIAGNOSIS — I10 Essential (primary) hypertension: Secondary | ICD-10-CM

## 2023-05-31 DIAGNOSIS — E1169 Type 2 diabetes mellitus with other specified complication: Secondary | ICD-10-CM

## 2023-05-31 MED ORDER — ATORVASTATIN CALCIUM 10 MG PO TABS: 10.0000 mg | ORAL_TABLET | Freq: Every day | ORAL | 1 refills | Status: AC

## 2023-05-31 NOTE — Progress Notes (Signed)
 Patient ID: Laura Ashley, female    DOB: Nov 15, 1990  MRN: [id number]  CC: Gynecologic Exam (Pap. /No questions / concerns/No to flu vax) and Congestive Heart Failure   Subjective: Laura Ashley is a 33 y.o. female who presents for PAP.  Her girlfriend is with her Her concerns today include:  Patient with history of DM type II microalbumin, HTN, obesity, microalbuminuria, PCOS.   Currently on menstrual cycle and wants to defer on doing Pap today.    HTN: On lisinopril 10 mg daily.  No device to check blood pressure. Limits salt in her foods. Taking Lisinopril and took already this a.m   HL: Patient states she never received the atorvastatin.  She checked with the pharmacy and was told that they did not have it.  Patient Active Problem List   Diagnosis Date Noted   PCOS (polycystic ovarian syndrome)    Hyperlipidemia associated with type 2 diabetes mellitus (HCC) 02/21/2021   Uncontrolled type 2 diabetes mellitus with microalbuminuria 10/27/2018   Elevated blood pressure reading 09/25/2018   Class 3 severe obesity due to excess calories with serious comorbidity and body mass index (BMI) of 45.0 to 49.9 in adult [facility]) 09/25/2018   History of PCOS 09/25/2018   Morbidly obese (HCC) 02/28/2014   Hypertension 02/28/2014   Dysfunctional uterine bleeding 02/28/2014     Current Outpatient Medications on File Prior to Visit  Medication Sig Dispense Refill   benzonatate (TESSALON PERLES) 100 MG capsule Take 1 capsule (100 mg total) by mouth 3 (three) times daily as needed. 20 capsule 0   Blood Glucose Monitoring Suppl (ONETOUCH VERIO) w/Device KIT Use as instructed to check blood sugar up to 3 times daily. E11.69 1 kit 0   Continuous Blood Gluc Receiver (FREESTYLE LIBRE 2 READER) DEVI Use to check blood glucose continuously. E11.65 1 each 0   Continuous Glucose Sensor (FREESTYLE LIBRE 2 SENSOR) MISC Use to check blood glucose continuously. Change sensors once every 14 days. E11.65  2 each 3   glucose blood (ONETOUCH VERIO) test strip Use as instructed to check blood sugar up to 3 times daily. E11.69 100 each 3   insulin glargine-yfgn (SEMGLEE) 100 UNIT/ML Pen Inject 32 Units into the skin daily. 15 mL 1   Insulin Pen Needle 32G X 4 MM MISC Using daily. 100 each 1   lisinopril (ZESTRIL) 10 MG tablet Take 1 tablet (10 mg total) by mouth daily. 90 tablet 1   methocarbamol (ROBAXIN) 500 MG tablet Take 1 tablet (500 mg total) by mouth 2 (two) times daily. 20 tablet 0   OneTouch Delica Lancets 33G MISC Use as instructed to check blood sugar up to 3 times daily. 100 each 6   Semaglutide, 2 MG/DOSE, 8 MG/3ML SOPN Inject 2 mg as directed once a week. 9 mL 1   Current Facility-Administered Medications on File Prior to Visit  Medication Dose Route Frequency Provider Last Rate Last Admin   sodium chloride 0.9 % bolus 1,000 mL  1,000 mL Intravenous Once Anders Simmonds, PA-C   Stopped at 05/26/22 1620    Allergies  Allergen Reactions   Penicillins Rash    Has patient had a PCN reaction causing immediate rash, facial/tongue/throat swelling, SOB or lightheadedness with hypotension: Yes Has patient had a PCN reaction causing severe rash involving mucus membranes or skin necrosis: Yes Has patient had a PCN reaction that required hospitalization: No Has patient had a PCN reaction occurring within the last 10 years: No If all of  the above answers are "NO", then may proceed with Cephalosporin use.    Social History   Socioeconomic History   Marital status: Single    Spouse name: Not on file   Number of children: 0   Years of education: Not on file   Highest education level: Associate degree: academic program  Occupational History   Not on file  Tobacco Use   Smoking status: Former   Smokeless tobacco: Never  Vaping Use   Vaping status: Never Used  Substance and Sexual Activity   Alcohol use: Yes    Comment: social   Drug use: No   Sexual activity: Yes    Birth  control/protection: None  Other Topics Concern   Not on file  Social History Narrative   Not on file   Social Drivers of Health   Financial Resource Strain: Low Risk  (05/31/2023)   Overall Financial Resource Strain (CARDIA)    Difficulty of Paying Living Expenses: Not hard at all  Food Insecurity: No Food Insecurity (05/31/2023)   Hunger Vital Sign    Worried About Running Out of Food in the Last Year: Never true    Ran Out of Food in the Last Year: Never true  Transportation Needs: No Transportation Needs (05/31/2023)   PRAPARE - Administrator, Civil Service (Medical): No    Lack of Transportation (Non-Medical): No  Physical Activity: Inactive (05/31/2023)   Exercise Vital Sign    Days of Exercise per Week: 0 days    Minutes of Exercise per Session: 0 min  Stress: No Stress Concern Present (05/31/2023)   [employer] of Occupational Health - Occupational Stress Questionnaire    Feeling of Stress : Not at all  Social Connections: Moderately Isolated (05/31/2023)   Social Connection and Isolation Panel [NHANES]    Frequency of Communication with Friends and Family: Twice a week    Frequency of Social Gatherings with Friends and Family: Three times a week    Attends Religious Services: More than 4 times per year    Active Member of Clubs or Organizations: No    Attends Banker Meetings: Never    Marital Status: Never married  Intimate Partner Violence: Not At Risk (05/31/2023)   Humiliation, Afraid, Rape, and Kick questionnaire    Fear of Current or Ex-Partner: No    Emotionally Abused: No    Physically Abused: No    Sexually Abused: No    Family History  Problem Relation Age of Onset   Diabetes Father    Diabetes Other    Healthy Mother     Past Surgical History:  Procedure Laterality Date   NO PAST SURGERIES      ROS: Review of Systems Negative except as stated above  PHYSICAL EXAM: BP 121/85   Pulse 81   Temp 97.9 F (36.6 C)  (Oral)   Ht 5\' 4"  (1.626 m)   Wt 236 lb (107 kg)   SpO2 99%   BMI 40.51 kg/m   Physical Exam  General appearance - alert, well appearing, young African-American female and in no distress Mental status - normal mood, behavior, speech, dress, motor activity, and thought processes      Latest Ref Rng & Units 03/17/2023    3:55 PM 08/27/2022   11:49 AM 03/23/2021    4:02 PM  CMP  Glucose 70 - 99 mg/dL 564  332  951   BUN 6 - 20 mg/dL 10  10  10  Creatinine 0.57 - 1.00 mg/dL 1.61  0.96  0.45   Sodium 134 - 144 mmol/L 135  136  131   Potassium 3.5 - 5.2 mmol/L 4.5  4.6  4.3   Chloride 96 - 106 mmol/L 98  99  98   CO2 20 - 29 mmol/L 25  23  24    Calcium 8.7 - 10.2 mg/dL 9.8  9.6  9.5   Total Protein 6.0 - 8.5 g/dL 6.5  7.0  7.8   Total Bilirubin 0.0 - 1.2 mg/dL 0.4  0.4  1.5   Alkaline Phos 44 - 121 IU/L 84  73  87   AST 0 - 40 IU/L 14  16  14    ALT 0 - 32 IU/L 17  18  18     Lipid Panel     Component Value Date/Time   CHOL 197 03/17/2023 1555   TRIG 166 (H) 03/17/2023 1555   HDL 44 03/17/2023 1555   CHOLHDL 4.5 (H) 03/17/2023 1555   LDLCALC 124 (H) 03/17/2023 1555    CBC    Component Value Date/Time   WBC 12.1 (H) 03/23/2021 1602   RBC 5.18 (H) 03/23/2021 1602   HGB 16.0 (H) 03/23/2021 1602   HGB 15.5 02/20/2021 1524   HCT 44.3 03/23/2021 1602   HCT 44.9 02/20/2021 1524   PLT 373 03/23/2021 1602   PLT 431 02/20/2021 1524   MCV 85.5 03/23/2021 1602   MCV 88 02/20/2021 1524   MCH 30.9 03/23/2021 1602   MCHC 36.1 (H) 03/23/2021 1602   RDW 11.2 (L) 03/23/2021 1602   RDW 11.5 (L) 02/20/2021 1524   LYMPHSABS 1.5 03/23/2021 1602   MONOABS 0.8 03/23/2021 1602   EOSABS 0.0 03/23/2021 1602   BASOSABS 0.0 03/23/2021 1602    ASSESSMENT AND PLAN: 1. Hypertension associated with type 2 diabetes mellitus (HCC) (Primary) Diastolic blood pressure not at goal.  Continue lisinopril 10 mg daily.  Prescription given for home blood pressure monitoring device.  Advised to check blood  pressure twice a week with goal being 130/80 or lower.  Bring readings with her on next visit in 3 weeks. - For home use only DME Other see comment  2. Hyperlipidemia associated with type 2 diabetes mellitus (HCC) I have resent prescription to the pharmacy for atorvastatin. - atorvastatin (LIPITOR) 10 MG tablet; Take 1 tablet (10 mg total) by mouth daily.  Dispense: 90 tablet; Refill: 1   Patient was given the opportunity to ask questions.  Patient verbalized understanding of the plan and was able to repeat key elements of the plan.   This documentation was completed using Paediatric nurse.  Any transcriptional errors are unintentional.  Orders Placed This Encounter  Procedures   For home use only DME Other see comment     Requested Prescriptions   Signed Prescriptions Disp Refills   atorvastatin (LIPITOR) 10 MG tablet 90 tablet 1    Sig: Take 1 tablet (10 mg total) by mouth daily.    Return in about 3 weeks (around 06/21/2023) for PAP.  Jonah Blue, MD, FACP

## 2023-06-01 ENCOUNTER — Other Ambulatory Visit: Payer: Self-pay

## 2023-06-24 ENCOUNTER — Ambulatory Visit: Attending: Internal Medicine | Admitting: Pharmacist

## 2023-06-24 ENCOUNTER — Other Ambulatory Visit: Payer: Self-pay

## 2023-06-24 ENCOUNTER — Encounter: Payer: Self-pay | Admitting: Pharmacist

## 2023-06-24 DIAGNOSIS — E1169 Type 2 diabetes mellitus with other specified complication: Secondary | ICD-10-CM | POA: Diagnosis not present

## 2023-06-24 DIAGNOSIS — Z7985 Long-term (current) use of injectable non-insulin antidiabetic drugs: Secondary | ICD-10-CM

## 2023-06-24 DIAGNOSIS — Z794 Long term (current) use of insulin: Secondary | ICD-10-CM

## 2023-06-24 LAB — POCT GLYCOSYLATED HEMOGLOBIN (HGB A1C): HbA1c, POC (controlled diabetic range): 9.8 % — AB (ref 0.0–7.0)

## 2023-06-24 MED ORDER — IBUPROFEN 800 MG PO TABS
800.0000 mg | ORAL_TABLET | Freq: Three times a day (TID) | ORAL | 0 refills | Status: DC | PRN
  Filled 2023-06-24: qty 30, 10d supply, fill #0

## 2023-06-24 NOTE — Progress Notes (Signed)
 S:     No chief complaint on file.  33 y.o. female who presents for diabetes evaluation, education, and management. PMH is significant for T2DM, HTN, HLD, PCOS.   Patient was referred and last seen by Primary Care Provider, Dr. Laural Benes, on 05/31/2023. Pharmacy last saw her on 05/17/2023, and I increased her Ozempic to 2mg  weekly at that time.   Today, patient arrives in good spirits and presents without any assistance. She had her first injection of 2mg  Ozempic this week. Endorses some nausea that occurred after injection for a couple of days. No vomiting, however, she did have a couple of times when she felt as though she would. She is adherent to [facility] at 32 units daily. Of note, she endorses increased symptoms of depression and is tearful on exam today. I have uploaded her PHQ score with this encounter. She is agreeable to our making a referral to psychology.   Family/Social History:  Fhx: Father - diabetes Tobacco: Former Smoker  Current diabetes medications include: Ozempic 2mg  weekly, Semglee 32 units daily Patient reports adherence to taking all medications as prescribed.   Insurance coverage: BCBS  Patient denies hypoglycemic events.  Reported home blood sugars: has not checked in a while  Patient denies nocturia (nighttime urination).  Patient denies neuropathy (nerve pain). Patient denies visual changes. Patient reports self foot exams.   Patient reported dietary habits:  -No changes since last visit  Patient-reported exercise habits: no changes since last visit   O:   Lab Results  Component Value Date   HGBA1C 9.8 (A) 06/24/2023   There were no vitals filed for this visit.  Lipid Panel     Component Value Date/Time   CHOL 197 03/17/2023 1555   TRIG 166 (H) 03/17/2023 1555   HDL 44 03/17/2023 1555   CHOLHDL 4.5 (H) 03/17/2023 1555   LDLCALC 124 (H) 03/17/2023 1555    Clinical Atherosclerotic Cardiovascular Disease (ASCVD): No  The ASCVD Risk score  (Arnett DK, et al., 2019) failed to calculate for the following reasons:   The 2019 ASCVD risk score is only valid for ages 60 to 88   Patient is participating in a Managed Medicaid Plan:  No   A/P: Diabetes longstanding currently uncontrolled but improving. A1c today is 9.8 (down from 12.3). Patient is able to verbalize appropriate hypoglycemia management plan. She is asymptomatic from a hyper- and a hypoglycemic standpoint. Medication adherence appears appropriate. Will have her increase continue Ozempic 2mg  weekly and continue same dose of Semglee for now. I covered with her that most GI symptoms resolve after the first week of taking the increased Ozempic dose. However, she will contact me next week if symptoms worsen or persist. Given that she has only taken the increased dose of Ozempic for 1 week, we will continue her regimen for now. She sees her PCP in 1 month. I will make plans to see her in 2 months. -Continue Ozempic 2 mg weekly. Contact me if GI side effects do not resolve or worsen. -Continue Semglee 32u daily.  -Message sent to PCP regarding PHQ score and depression symptoms.  -Patient educated on purpose, proper use, and potential adverse effects of Ozempic.  -Extensively discussed pathophysiology of diabetes, recommended lifestyle interventions, dietary effects on blood sugar control.  -Counseled on s/sx of and management of hypoglycemia.  -Next A1c anticipated 06/2023.  Written patient instructions provided. Patient verbalized understanding of treatment plan.  Total time in face to face counseling 30 minutes.    Follow-up:  Pharmacist in 2 months. PCP 07/21/2023.  Butch Penny, PharmD, P[place]y [place], CPP Clinical Pharmacist [facility] [phone]

## 2023-06-26 ENCOUNTER — Telehealth: Payer: Self-pay | Admitting: Internal Medicine

## 2023-06-26 DIAGNOSIS — F320 Major depressive disorder, single episode, mild: Secondary | ICD-10-CM

## 2023-06-26 NOTE — Telephone Encounter (Signed)
-----   Message from Luster Salters Ausdall sent at 06/24/2023  2:18 PM EDT ----- Dr. Lincoln Renshaw,   Saw this patient this afternoon for DM. Of note, she is tearful on encounter and endorses increased symptoms of depression and anxiety over the past month since we last saw her. I am uploading a GAD and PHQ score on this encounter. Denies any SI/HI, no red flag symptoms, but does admit to "just going through a lot". Not currently on any antidepressant or antianxiety medication.   She does not wish to start medication at this time but is interested in referral for counseling. Are you able to submit this or do you need to see her first?   [place]

## 2023-06-26 NOTE — Telephone Encounter (Signed)
    06/24/2023    2:26 PM 05/31/2023   11:32 AM 04/19/2023   11:06 AM  Depression screen PHQ 2/9  Decreased Interest 1 0 0  Down, Depressed, Hopeless 2 0 1  PHQ - 2 Score 3 0 1  Altered sleeping 1 0 0  Tired, decreased energy 1 1 1   Change in appetite 1 1 1   Feeling bad or failure about yourself  1 0 0  Trouble concentrating  0 0  Moving slowly or fidgety/restless 0 0 0  Suicidal thoughts 0 0 0  PHQ-9 Score 7 2 3   Difficult doing work/chores Very difficult Not difficult at all Not difficult at all      05/31/2023   11:32 AM 04/19/2023   11:07 AM 05/26/2022    3:02 PM 02/20/2021    2:33 PM  GAD 7 : Generalized Anxiety Score  Nervous, Anxious, on Edge 0 1 1 2   Control/stop worrying 1 0 1 3  Worry too much - different things 0 0 1 3  Trouble relaxing 0 1 2 2   Restless 0 0 0 3  Easily annoyed or irritable 1 1 3 3   Afraid - awful might happen 0 0 0 2  Total GAD 7 Score 2 3 8 18   Anxiety Difficulty Not difficult at all Not difficult at all

## 2023-07-01 ENCOUNTER — Other Ambulatory Visit: Payer: Self-pay

## 2023-07-04 ENCOUNTER — Other Ambulatory Visit (HOSPITAL_COMMUNITY)
Admission: RE | Admit: 2023-07-04 | Discharge: 2023-07-04 | Disposition: A | Discharge status: Home / Self Care | Source: Ambulatory Visit | Attending: Internal Medicine | Admitting: Internal Medicine

## 2023-07-04 ENCOUNTER — Other Ambulatory Visit: Payer: Self-pay

## 2023-07-04 ENCOUNTER — Ambulatory Visit: Attending: Internal Medicine | Admitting: Internal Medicine

## 2023-07-04 ENCOUNTER — Encounter: Payer: Self-pay | Admitting: Internal Medicine

## 2023-07-04 ENCOUNTER — Ambulatory Visit: Payer: Self-pay

## 2023-07-04 VITALS — BP 115/73 | HR 100 | Temp 98.1°F | Ht 64.0 in | Wt 231.0 lb

## 2023-07-04 DIAGNOSIS — Z202 Contact with and (suspected) exposure to infections with a predominantly sexual mode of transmission: Secondary | ICD-10-CM | POA: Diagnosis not present

## 2023-07-04 DIAGNOSIS — R1032 Left lower quadrant pain: Secondary | ICD-10-CM | POA: Diagnosis not present

## 2023-07-04 DIAGNOSIS — Z113 Encounter for screening for infections with a predominantly sexual mode of transmission: Secondary | ICD-10-CM | POA: Diagnosis not present

## 2023-07-04 MED ORDER — METRONIDAZOLE 500 MG PO TABS
500.0000 mg | ORAL_TABLET | Freq: Two times a day (BID) | ORAL | 0 refills | Status: DC
  Filled 2023-07-04: qty 14, 7d supply, fill #0

## 2023-07-04 NOTE — Telephone Encounter (Signed)
 Noted.

## 2023-07-04 NOTE — Progress Notes (Signed)
 Patient ID: Laura Ashley, female    DOB: [birth year]  MRN: [id number]  CC: Exposure to STD (Trich exposure - experiencing back pain & lower abdominal pain X3 days /)   Subjective: Laura Ashley is a 33 y.o. female who presents for UC visit. Her concerns today include:  Patient with history of DM type II microalbumin, HTN, obesity, microalbuminuria, PCOS.   Her female partner recently tested positive for trichomonas Reports intermittent pain across suprapubic area and lower back since Friday.  Pain is crampy and sharp.  Denies any dysuria or fever.  Last menstrual cycle was about 2 weeks ago.  She is sexually active with female and is not concerned for pregnancy.  Endorses white vaginal discharge that started on Friday as well.    Patient Active Problem List   Diagnosis Date Noted   PCOS (polycystic ovarian syndrome)    Hyperlipidemia associated with type 2 diabetes mellitus (HCC) 02/21/2021   Uncontrolled type 2 diabetes mellitus with microalbuminuria 10/27/2018   Elevated blood pressure reading 09/25/2018   Class 3 severe obesity due to excess calories with serious comorbidity and body mass index (BMI) of 45.0 to 49.9 in adult [facility]) 09/25/2018   History of PCOS 09/25/2018   Morbidly obese (HCC) 02/28/2014   Hypertension 02/28/2014   Dysfunctional uterine bleeding 02/28/2014     Current Outpatient Medications on File Prior to Visit  Medication Sig Dispense Refill   atorvastatin  (LIPITOR) 10 MG tablet Take 1 tablet (10 mg total) by mouth daily. 90 tablet 1   benzonatate  (TESSALON  PERLES) 100 MG capsule Take 1 capsule (100 mg total) by mouth 3 (three) times daily as needed. 20 capsule 0   Blood Glucose Monitoring Suppl (ONETOUCH VERIO) w/Device KIT Use as instructed to check blood sugar up to 3 times daily. E11.69 1 kit 0   Continuous Blood Gluc Receiver (FREESTYLE LIBRE 2 READER) DEVI Use to check blood glucose continuously. E11.65 1 each 0   Continuous Glucose Sensor  (FREESTYLE LIBRE 2 SENSOR) MISC Use to check blood glucose continuously. Change sensors once every 14 days. E11.65 2 each 3   glucose blood (ONETOUCH VERIO) test strip Use as instructed to check blood sugar up to 3 times daily. E11.69 100 each 3   ibuprofen  (ADVIL ) 800 MG tablet Take 1 tablet (800 mg total) by mouth every 8 (eight) hours as needed. 30 tablet 0   insulin  glargine-yfgn (SEMGLEE ) 100 UNIT/ML Pen Inject 32 Units into the skin daily. 15 mL 1   Insulin  Pen Needle 32G X 4 MM MISC Using daily. 100 each 1   lisinopril  (ZESTRIL ) 10 MG tablet Take 1 tablet (10 mg total) by mouth daily. 90 tablet 1   methocarbamol  (ROBAXIN ) 500 MG tablet Take 1 tablet (500 mg total) by mouth 2 (two) times daily. 20 tablet 0   OneTouch Delica Lancets 33G MISC Use as instructed to check blood sugar up to 3 times daily. 100 each 6   Semaglutide , 2 MG/DOSE, 8 MG/3ML SOPN Inject 2 mg as directed once a week. 9 mL 1   Current Facility-Administered Medications on File Prior to Visit  Medication Dose Route Frequency Provider Last Rate Last Admin   sodium chloride  0.9 % bolus 1,000 mL  1,000 mL Intravenous Once Dulce Gibbs M, PA-C   Stopped at 05/26/22 1620    Allergies  Allergen Reactions   Penicillins Rash    Has patient had a PCN reaction causing immediate rash, facial/tongue/throat swelling, SOB or lightheadedness with hypotension: Yes Has  patient had a PCN reaction causing severe rash involving mucus membranes or skin necrosis: Yes Has patient had a PCN reaction that required hospitalization: No Has patient had a PCN reaction occurring within the last 10 years: No If all of the above answers are "NO", then may proceed with Cephalosporin use.    Social History   Socioeconomic History   Marital status: Single    Spouse name: Not on file   Number of children: 0   Years of education: Not on file   Highest education level: Associate degree: academic program  Occupational History   Not on file   Tobacco Use   Smoking status: Former   Smokeless tobacco: Never  Vaping Use   Vaping status: Never Used  Substance and Sexual Activity   Alcohol use: Yes    Comment: social   Drug use: No   Sexual activity: Yes    Birth control/protection: None  Other Topics Concern   Not on file  Social History Narrative   Not on file   Social Drivers of Health   Financial Resource Strain: Low Risk  (05/31/2023)   Overall Financial Resource Strain (CARDIA)    Difficulty of Paying Living Expenses: Not hard at all  Food Insecurity: No Food Insecurity (05/31/2023)   Hunger Vital Sign    Worried About Running Out of Food in the Last Year: Never true    Ran Out of Food in the Last Year: Never true  Transportation Needs: No Transportation Needs (05/31/2023)   PRAPARE - Administrator, Civil Service (Medical): No    Lack of Transportation (Non-Medical): No  Physical Activity: Inactive (05/31/2023)   Exercise Vital Sign    Days of Exercise per Week: 0 days    Minutes of Exercise per Session: 0 min  Stress: No Stress Concern Present (05/31/2023)   [employer] of Occupational Health - Occupational Stress Questionnaire    Feeling of Stress : Not at all  Social Connections: Moderately Isolated (05/31/2023)   Social Connection and Isolation Panel [NHANES]    Frequency of Communication with Friends and Family: Twice a week    Frequency of Social Gatherings with Friends and Family: Three times a week    Attends Religious Services: More than 4 times per year    Active Member of Clubs or Organizations: No    Attends Banker Meetings: Never    Marital Status: Never married  Intimate Partner Violence: Not At Risk (05/31/2023)   Humiliation, Afraid, Rape, and Kick questionnaire    Fear of Current or Ex-Partner: No    Emotionally Abused: No    Physically Abused: No    Sexually Abused: No    Family History  Problem Relation Age of Onset   Diabetes Father    Diabetes Other     Healthy Mother     Past Surgical History:  Procedure Laterality Date   NO PAST SURGERIES      ROS: Review of Systems Negative except as stated above  PHYSICAL EXAM: BP 115/73 (BP Location: Left Arm, Patient Position: Sitting, Cuff Size: Large)   Pulse 100   Temp 98.1 F (36.7 C) (Oral)   Ht 5\' 4"  (1.626 m)   Wt 231 lb (104.8 kg)   SpO2 98%   BMI 39.65 kg/m   Physical Exam  General appearance - alert, well appearing, and in no distress Mental status - normal mood, behavior, speech, dress, motor activity, and thought processes Abdomen -normal bowel sounds, nondistended, soft,  slight right lower quadrant tenderness without guarding or rebound.   Musculoskeletal -slight tenderness in the left lumbar paraspinal muscle.        Latest Ref Rng & Units 03/17/2023    3:55 PM 08/27/2022   11:49 AM 03/23/2021    4:02 PM  CMP  Glucose 70 - 99 mg/dL 295  621  308   BUN 6 - 20 mg/dL 10  10  10    Creatinine 0.57 - 1.00 mg/dL 6.57  8.46  9.62   Sodium 134 - 144 mmol/L 135  136  131   Potassium 3.5 - 5.2 mmol/L 4.5  4.6  4.3   Chloride 96 - 106 mmol/L 98  99  98   CO2 20 - 29 mmol/L 25  23  24    Calcium  8.7 - 10.2 mg/dL 9.8  9.6  9.5   Total Protein 6.0 - 8.5 g/dL 6.5  7.0  7.8   Total Bilirubin 0.0 - 1.2 mg/dL 0.4  0.4  1.5   Alkaline Phos 44 - 121 IU/L 84  73  87   AST 0 - 40 IU/L 14  16  14    ALT 0 - 32 IU/L 17  18  18     Lipid Panel     Component Value Date/Time   CHOL 197 03/17/2023 1555   TRIG 166 (H) 03/17/2023 1555   HDL 44 03/17/2023 1555   CHOLHDL 4.5 (H) 03/17/2023 1555   LDLCALC 124 (H) 03/17/2023 1555    CBC    Component Value Date/Time   WBC 12.1 (H) 03/23/2021 1602   RBC 5.18 (H) 03/23/2021 1602   HGB 16.0 (H) 03/23/2021 1602   HGB 15.5 02/20/2021 1524   HCT 44.3 03/23/2021 1602   HCT 44.9 02/20/2021 1524   PLT 373 03/23/2021 1602   PLT 431 02/20/2021 1524   MCV 85.5 03/23/2021 1602   MCV 88 02/20/2021 1524   MCH 30.9 03/23/2021 1602   MCHC 36.1  (H) 03/23/2021 1602   RDW 11.2 (L) 03/23/2021 1602   RDW 11.5 (L) 02/20/2021 1524   LYMPHSABS 1.5 03/23/2021 1602   MONOABS 0.8 03/23/2021 1602   EOSABS 0.0 03/23/2021 1602   BASOSABS 0.0 03/23/2021 1602    ASSESSMENT AND PLAN: 1. Possible exposure to STI (Primary) Patient reporting that her female partner recently tested positive for trichomonas.  Patient herself reports some white vaginal discharge at this time with intermittent left lower quadrant abdominal pain.  I recommend that we treat empirically for trichomonas.  Vaginal culture sent.  Advised there is no way of knowing when partner or herself may have contracted trichomonas but the most important thing is that it is treated.  Both she and her partner should complete treatment before having unprotected intercourse again. - metroNIDAZOLE  (FLAGYL ) 500 MG tablet; Take 1 tablet (500 mg total) by mouth 2 (two) times daily.  Dispense: 14 tablet; Refill: 0 - Cervicovaginal ancillary only  2. Left lower quadrant abdominal pain We will see if symptoms resolve with antibiotics.  If symptoms do not resolve, next step would be obtaining a pelvic ultrasound.    Patient was given the opportunity to ask questions.  Patient verbalized understanding of the plan and was able to repeat key elements of the plan.   This documentation was completed using Paediatric nurse.  Any transcriptional errors are unintentional.  No orders of the defined types were placed in this encounter.    Requested Prescriptions   Signed Prescriptions Disp Refills  metroNIDAZOLE  (FLAGYL ) 500 MG tablet 14 tablet 0    Sig: Take 1 tablet (500 mg total) by mouth 2 (two) times daily.    No follow-ups on file.  Concetta Dee, MD, FACP

## 2023-07-04 NOTE — Telephone Encounter (Signed)
 Chief Complaint: sti exposure Symptoms: dishcarge and abd pain Frequency: friday  Disposition: [] ED /[] Urgent Care (no appt availability in office) / [x] Appointment(In office/virtual)/ []  [facility]/ [] Home Care/ [] Refused Recommended Disposition /[] [facility]/ []  Follow-up with PCP Additional Notes: pt states that her partner has lab work done recently and told her on yesterday that her test came back possible for Trichomonas. Pt states that they were last sexually active on Friday and that she is experiencing white discharge and abd pain.   Copied from CRM [id number]. Topic: Clinical - Medical Advice >> Jul 04, 2023 10:49 AM Opal Bill wrote: Reason for CRM: Pt says that her partner was treated for Trichomoniasis. She was unable to provide the medication name. Pt is asking if she needs to come in to be seen and tested first, or can she just be prescribed the medication to treat it. She is requesting to get a call back from [place], the pharmacist. Reason for Disposition  Sex partner of someone who was diagnosed with an STI  (Exception: Female exposed to bacterial vaginosis or vaginal yeast infection.)  Answer Assessment - Initial Assessment Questions 1. MAIN CONCERN: "What were you exposed to?"  "What sexually transmitted infection (STI) does your sex partner have?" (e.g., gonorrhea, herpes, HIV, pubic lice)     Partner tested positive for Trichomonas 2. ROUTE of EXPOSURE: "How were you exposed to the STI?" (e.g., oral, vaginal, or rectal intercourse)     Vaginal intercourse 3. DATE of EXPOSURE: "When did the exposure occur?" (e.g., days)     friday 4. SYMPTOMS: "Do you have any symptoms?" (e.g., pain with urination, rash, sores)     White discharge, pain in abd 5. PREGNANCY: "Is there any chance you are pregnant?" "When was your last menstrual period?"     no  Protocols used: STI Exposure-A-AH

## 2023-07-06 ENCOUNTER — Encounter: Payer: Self-pay | Admitting: Internal Medicine

## 2023-07-06 LAB — CERVICOVAGINAL ANCILLARY ONLY
Bacterial Vaginitis (gardnerella): POSITIVE — AB
Candida Glabrata: NEGATIVE
Candida Vaginitis: NEGATIVE
Chlamydia: NEGATIVE
Comment: NEGATIVE
Comment: NEGATIVE
Comment: NEGATIVE
Comment: NEGATIVE
Comment: NEGATIVE
Comment: NORMAL
Neisseria Gonorrhea: NEGATIVE
Trichomonas: NEGATIVE

## 2023-07-21 ENCOUNTER — Other Ambulatory Visit (HOSPITAL_COMMUNITY)
Admission: RE | Admit: 2023-07-21 | Discharge: 2023-07-21 | Disposition: A | Discharge status: Home / Self Care | Source: Ambulatory Visit | Attending: Internal Medicine | Admitting: Internal Medicine

## 2023-07-21 ENCOUNTER — Ambulatory Visit: Attending: Internal Medicine | Admitting: Internal Medicine

## 2023-07-21 ENCOUNTER — Encounter: Payer: Self-pay | Admitting: Internal Medicine

## 2023-07-21 ENCOUNTER — Other Ambulatory Visit: Payer: Self-pay

## 2023-07-21 VITALS — BP 136/89 | HR 84 | Temp 98.1°F | Ht 64.0 in | Wt 226.0 lb

## 2023-07-21 DIAGNOSIS — I152 Hypertension secondary to endocrine disorders: Secondary | ICD-10-CM

## 2023-07-21 DIAGNOSIS — Z1151 Encounter for screening for human papillomavirus (HPV): Secondary | ICD-10-CM | POA: Diagnosis not present

## 2023-07-21 DIAGNOSIS — Z794 Long term (current) use of insulin: Secondary | ICD-10-CM | POA: Diagnosis not present

## 2023-07-21 DIAGNOSIS — Z124 Encounter for screening for malignant neoplasm of cervix: Secondary | ICD-10-CM | POA: Insufficient documentation

## 2023-07-21 DIAGNOSIS — E119 Type 2 diabetes mellitus without complications: Secondary | ICD-10-CM

## 2023-07-21 DIAGNOSIS — Z113 Encounter for screening for infections with a predominantly sexual mode of transmission: Secondary | ICD-10-CM | POA: Insufficient documentation

## 2023-07-21 DIAGNOSIS — I10 Essential (primary) hypertension: Secondary | ICD-10-CM | POA: Diagnosis not present

## 2023-07-21 MED ORDER — LISINOPRIL 10 MG PO TABS
15.0000 mg | ORAL_TABLET | Freq: Every day | ORAL | 1 refills | Status: DC
  Filled 2023-07-21: qty 45, 30d supply, fill #0

## 2023-07-21 MED ORDER — INSULIN GLARGINE-YFGN 100 UNIT/ML ~~LOC~~ SOPN
32.0000 [IU] | PEN_INJECTOR | Freq: Every day | SUBCUTANEOUS | 1 refills | Status: DC
  Filled 2023-07-21 – 2023-08-05 (×2): qty 9, 28d supply, fill #0

## 2023-07-21 NOTE — Progress Notes (Signed)
 Patient ID: [name], female    DOB: Sep 10, 1990  MRN: [id number]  CC: Gynecologic Exam (Pap. Med refill. /No questions / concerns)   Subjective: Laura Ashley is a 33 y.o. female who presents for chronic ds management. Girlfriend, is with her Her concerns today include:  Patient with history of DM type II microalbumin, HTN, obesity, microalbuminuria, PCOS.   GYN History:  Pt is G2P0 Any hx of abn paps?: no Menses regular or irregular?: irregular due to PCOS, last one went off yesterday How long does menses last? 4-5 days; some times a mth Menstrual flow light or heavy?: moderate Method of birth control?:  none Any vaginal dischg at this time?: no Dysuria?: no Any hx of STI?: no Sexually active with how many partners: one female Desires STI screen: yes Last MMG: NA Family hx of uterine, cervical or breast cancer?:  no  Patient noted to be elevated today.  She took lisinopril  10 mg already for today.  Patient Active Problem List   Diagnosis Date Noted   PCOS (polycystic ovarian syndrome)    Hyperlipidemia associated with type 2 diabetes mellitus (HCC) 02/21/2021   Uncontrolled type 2 diabetes mellitus with microalbuminuria 10/27/2018   Elevated blood pressure reading 09/25/2018   Class 3 severe obesity due to excess calories with serious comorbidity and body mass index (BMI) of 45.0 to 49.9 in adult 09/25/2018   History of PCOS 09/25/2018   Morbidly obese (HCC) 02/28/2014   Hypertension 02/28/2014   Dysfunctional uterine bleeding 02/28/2014     Current Outpatient Medications on File Prior to Visit  Medication Sig Dispense Refill   atorvastatin  (LIPITOR) 10 MG tablet Take 1 tablet (10 mg total) by mouth daily. 90 tablet 1   benzonatate  (TESSALON  PERLES) 100 MG capsule Take 1 capsule (100 mg total) by mouth 3 (three) times daily as needed. 20 capsule 0   Blood Glucose Monitoring Suppl (ONETOUCH VERIO) w/Device KIT Use as instructed to check blood sugar up to 3  times daily. E11.69 1 kit 0   Continuous Blood Gluc Receiver (FREESTYLE LIBRE 2 READER) DEVI Use to check blood glucose continuously. E11.65 1 each 0   Continuous Glucose Sensor (FREESTYLE LIBRE 2 SENSOR) MISC Use to check blood glucose continuously. Change sensors once every 14 days. E11.65 2 each 3   glucose blood (ONETOUCH VERIO) test strip Use as instructed to check blood sugar up to 3 times daily. E11.69 100 each 3   ibuprofen  (ADVIL ) 800 MG tablet Take 1 tablet (800 mg total) by mouth every 8 (eight) hours as needed. 30 tablet 0   Insulin  Pen Needle 32G X 4 MM MISC Using daily. 100 each 1   methocarbamol  (ROBAXIN ) 500 MG tablet Take 1 tablet (500 mg total) by mouth 2 (two) times daily. 20 tablet 0   metroNIDAZOLE  (FLAGYL ) 500 MG tablet Take 1 tablet (500 mg total) by mouth 2 (two) times daily. 14 tablet 0   OneTouch Delica Lancets 33G MISC Use as instructed to check blood sugar up to 3 times daily. 100 each 6   Semaglutide , 2 MG/DOSE, 8 MG/3ML SOPN Inject 2 mg as directed once a week. 9 mL 1   Current Facility-Administered Medications on File Prior to Visit  Medication Dose Route Frequency Provider Last Rate Last Admin   sodium chloride  0.9 % bolus 1,000 mL  1,000 mL Intravenous Once Hassie Lint, PA-C   Stopped at 05/26/22 1620    Allergies  Allergen Reactions   Penicillins Rash  Has patient had a PCN reaction causing immediate rash, facial/tongue/throat swelling, SOB or lightheadedness with hypotension: Yes Has patient had a PCN reaction causing severe rash involving mucus membranes or skin necrosis: Yes Has patient had a PCN reaction that required hospitalization: No Has patient had a PCN reaction occurring within the last 10 years: No If all of the above answers are "NO", then may proceed with Cephalosporin use.    Social History   Socioeconomic History   Marital status: Single    Spouse name: Not on file   Number of children: 0   Years of education: Not on file    Highest education level: Associate degree: academic program  Occupational History   Not on file  Tobacco Use   Smoking status: Former   Smokeless tobacco: Never  Vaping Use   Vaping status: Never Used  Substance and Sexual Activity   Alcohol use: Yes    Comment: social   Drug use: No   Sexual activity: Yes    Birth control/protection: None  Other Topics Concern   Not on file  Social History Narrative   Not on file   Social Drivers of Health   Financial Resource Strain: Low Risk  (05/31/2023)   Overall Financial Resource Strain (CARDIA)    Difficulty of Paying Living Expenses: Not hard at all  Food Insecurity: No Food Insecurity (05/31/2023)   Hunger Vital Sign    Worried About Running Out of Food in the Last Year: Never true    Ran Out of Food in the Last Year: Never true  Transportation Needs: No Transportation Needs (05/31/2023)   PRAPARE - Administrator, Civil Service (Medical): No    Lack of Transportation (Non-Medical): No  Physical Activity: Inactive (05/31/2023)   Exercise Vital Sign    Days of Exercise per Week: 0 days    Minutes of Exercise per Session: 0 min  Stress: No Stress Concern Present (05/31/2023)   [employer] of Occupational Health - Occupational Stress Questionnaire    Feeling of Stress : Not at all  Social Connections: Moderately Isolated (05/31/2023)   Social Connection and Isolation Panel [NHANES]    Frequency of Communication with Friends and Family: Twice a week    Frequency of Social Gatherings with Friends and Family: Three times a week    Attends Religious Services: More than 4 times per year    Active Member of Clubs or Organizations: No    Attends Banker Meetings: Never    Marital Status: Never married  Intimate Partner Violence: Not At Risk (05/31/2023)   Humiliation, Afraid, Rape, and Kick questionnaire    Fear of Current or Ex-Partner: No    Emotionally Abused: No    Physically Abused: No    Sexually  Abused: No    Family History  Problem Relation Age of Onset   Diabetes Father    Diabetes Other    Healthy Mother     Past Surgical History:  Procedure Laterality Date   NO PAST SURGERIES      ROS: Review of Systems Negative except as stated above  PHYSICAL EXAM: BP 136/89 (BP Location: Left Arm, Patient Position: Sitting, Cuff Size: Large)   Pulse 84   Temp 98.1 F (36.7 C) (Oral)   Ht 5\' 4"  (1.626 m)   Wt 226 lb (102.5 kg)   SpO2 98%   BMI 38.79 kg/m   Physical Exam  General appearance - alert, well appearing, and in no distress Mental  status - normal mood, behavior, speech, dress, motor activity, and thought processes Pelvic -CMA Clarissa present for pelvic exam.  Normal external genitalia, vulva, vagina, cervix, uterus and adnexa.  Small amount of blood in cervical oz      Latest Ref Rng & Units 03/17/2023    3:55 PM 08/27/2022   11:49 AM 03/23/2021    4:02 PM  CMP  Glucose 70 - 99 mg/dL 161  096  045   BUN 6 - 20 mg/dL 10  10  10    Creatinine 0.57 - 1.00 mg/dL 4.09  8.11  9.14   Sodium 134 - 144 mmol/L 135  136  131   Potassium 3.5 - 5.2 mmol/L 4.5  4.6  4.3   Chloride 96 - 106 mmol/L 98  99  98   CO2 20 - 29 mmol/L 25  23  24    Calcium  8.7 - 10.2 mg/dL 9.8  9.6  9.5   Total Protein 6.0 - 8.5 g/dL 6.5  7.0  7.8   Total Bilirubin 0.0 - 1.2 mg/dL 0.4  0.4  1.5   Alkaline Phos 44 - 121 IU/L 84  73  87   AST 0 - 40 IU/L 14  16  14    ALT 0 - 32 IU/L 17  18  18     Lipid Panel     Component Value Date/Time   CHOL 197 03/17/2023 1555   TRIG 166 (H) 03/17/2023 1555   HDL 44 03/17/2023 1555   CHOLHDL 4.5 (H) 03/17/2023 1555   LDLCALC 124 (H) 03/17/2023 1555    CBC    Component Value Date/Time   WBC 12.1 (H) 03/23/2021 1602   RBC 5.18 (H) 03/23/2021 1602   HGB 16.0 (H) 03/23/2021 1602   HGB 15.5 02/20/2021 1524   HCT 44.3 03/23/2021 1602   HCT 44.9 02/20/2021 1524   PLT 373 03/23/2021 1602   PLT 431 02/20/2021 1524   MCV 85.5 03/23/2021 1602   MCV 88  02/20/2021 1524   MCH 30.9 03/23/2021 1602   MCHC 36.1 (H) 03/23/2021 1602   RDW 11.2 (L) 03/23/2021 1602   RDW 11.5 (L) 02/20/2021 1524   LYMPHSABS 1.5 03/23/2021 1602   MONOABS 0.8 03/23/2021 1602   EOSABS 0.0 03/23/2021 1602   BASOSABS 0.0 03/23/2021 1602    ASSESSMENT AND PLAN: 1. Pap smear for cervical cancer screening (Primary) - Cytology - PAP - Cervicovaginal ancillary only  2. Hypertension associated with type 2 diabetes mellitus (HCC) Repeat blood pressure today still not at goal.  We discussed increasing lisinopril  to 15 mg daily.  Rxn sent.   Patient was given the opportunity to ask questions.  Patient verbalized understanding of the plan and was able to repeat key elements of the plan.   This documentation was completed using Paediatric nurse.  Any transcriptional errors are unintentional.  No orders of the defined types were placed in this encounter.    Requested Prescriptions   Signed Prescriptions Disp Refills   insulin  glargine-yfgn (SEMGLEE ) 100 UNIT/ML Pen 15 mL 1    Sig: Inject 32 Units into the skin daily.   lisinopril  (ZESTRIL ) 10 MG tablet 135 tablet 1    Sig: Take 1.5 tablets (15 mg total) by mouth daily.    Return in about 3 months (around 10/21/2023).  Concetta Dee, MD, FACP

## 2023-07-22 ENCOUNTER — Other Ambulatory Visit: Payer: Self-pay

## 2023-07-22 LAB — CERVICOVAGINAL ANCILLARY ONLY
Bacterial Vaginitis (gardnerella): POSITIVE — AB
Candida Glabrata: NEGATIVE
Candida Vaginitis: NEGATIVE
Chlamydia: NEGATIVE
Comment: NEGATIVE
Comment: NEGATIVE
Comment: NEGATIVE
Comment: NEGATIVE
Comment: NEGATIVE
Comment: NORMAL
Neisseria Gonorrhea: NEGATIVE
Trichomonas: NEGATIVE

## 2023-07-23 ENCOUNTER — Other Ambulatory Visit: Payer: Self-pay | Admitting: Internal Medicine

## 2023-07-23 ENCOUNTER — Encounter: Payer: Self-pay | Admitting: Internal Medicine

## 2023-07-23 DIAGNOSIS — Z202 Contact with and (suspected) exposure to infections with a predominantly sexual mode of transmission: Secondary | ICD-10-CM

## 2023-07-23 MED ORDER — METRONIDAZOLE 500 MG PO TABS
500.0000 mg | ORAL_TABLET | Freq: Two times a day (BID) | ORAL | 0 refills | Status: DC
  Filled 2023-07-23 – 2023-08-05 (×2): qty 14, 7d supply, fill #0

## 2023-07-23 MED ORDER — METRONIDAZOLE 0.75 % VA GEL
1.0000 | VAGINAL | 1 refills | Status: DC
  Filled 2023-07-23: qty 70, 24d supply, fill #0

## 2023-07-25 ENCOUNTER — Other Ambulatory Visit: Payer: Self-pay

## 2023-07-27 ENCOUNTER — Ambulatory Visit: Payer: Self-pay | Admitting: Internal Medicine

## 2023-07-27 LAB — CYTOLOGY - PAP
Comment: NEGATIVE
Diagnosis: NEGATIVE
Diagnosis: REACTIVE
High risk HPV: NEGATIVE

## 2023-08-01 ENCOUNTER — Other Ambulatory Visit: Payer: Self-pay

## 2023-08-02 ENCOUNTER — Other Ambulatory Visit: Payer: Self-pay

## 2023-08-03 ENCOUNTER — Other Ambulatory Visit: Payer: Self-pay

## 2023-08-05 ENCOUNTER — Other Ambulatory Visit: Payer: Self-pay

## 2023-08-18 ENCOUNTER — Other Ambulatory Visit: Payer: Self-pay

## 2023-08-23 ENCOUNTER — Ambulatory Visit: Admitting: Pharmacist

## 2023-09-22 ENCOUNTER — Telehealth: Payer: Self-pay | Admitting: Internal Medicine

## 2023-09-22 ENCOUNTER — Encounter: Payer: Self-pay | Admitting: Pharmacist

## 2023-09-22 ENCOUNTER — Ambulatory Visit: Attending: Internal Medicine | Admitting: Pharmacist

## 2023-09-22 ENCOUNTER — Other Ambulatory Visit: Payer: Self-pay

## 2023-09-22 DIAGNOSIS — Z7985 Long-term (current) use of injectable non-insulin antidiabetic drugs: Secondary | ICD-10-CM | POA: Diagnosis not present

## 2023-09-22 DIAGNOSIS — Z794 Long term (current) use of insulin: Secondary | ICD-10-CM | POA: Diagnosis not present

## 2023-09-22 DIAGNOSIS — E1169 Type 2 diabetes mellitus with other specified complication: Secondary | ICD-10-CM

## 2023-09-22 LAB — POCT GLYCOSYLATED HEMOGLOBIN (HGB A1C): HbA1c, POC (controlled diabetic range): 5.9 % (ref 0.0–7.0)

## 2023-09-22 MED ORDER — INSULIN GLARGINE-YFGN 100 UNIT/ML ~~LOC~~ SOPN
26.0000 [IU] | PEN_INJECTOR | Freq: Every day | SUBCUTANEOUS | 1 refills | Status: DC
Start: 2023-09-22 — End: 2023-12-01
  Filled 2023-09-22: qty 6, 23d supply, fill #0

## 2023-09-22 NOTE — Progress Notes (Signed)
   S:     No chief complaint on file.  33 y.o. female who presents for diabetes evaluation, education, and management. PMH is significant for T2DM, HTN, HLD, PCOS.   Patient was referred and last seen by Primary Care Provider, Dr. Vicci, on 07/21/2023. Pharmacy last saw her on 06/24/2023.  Today, patient arrives in good spirits and presents without any assistance. Denies any NV, abdominal pain with the Ozempic . No changes in vision. She is adherent to this and her Semglee  at 32 units daily.    Family/Social History:  Fhx: Father - diabetes Tobacco: Former Smoker  Current diabetes medications include: Ozempic  2mg  weekly, Semglee  32 units daily Patient reports adherence to taking all medications as prescribed.   Insurance coverage: BCBS  Patient denies hypoglycemic events.  Reported home blood sugars: has not checked in a while  Patient denies nocturia (nighttime urination).  Patient denies neuropathy (nerve pain). Patient denies visual changes. Patient reports self foot exams.   Patient reported dietary habits:  -No changes since last visit  Patient-reported exercise habits: no changes since last visit   O:   Lab Results  Component Value Date   HGBA1C 5.9 09/22/2023   There were no vitals filed for this visit.  Lipid Panel     Component Value Date/Time   CHOL 197 03/17/2023 1555   TRIG 166 (H) 03/17/2023 1555   HDL 44 03/17/2023 1555   CHOLHDL 4.5 (H) 03/17/2023 1555   LDLCALC 124 (H) 03/17/2023 1555    Clinical Atherosclerotic Cardiovascular Disease (ASCVD): No  The ASCVD Risk score (Arnett DK, et al., 2019) failed to calculate for the following reasons:   The 2019 ASCVD risk score is only valid for ages 30 to 88   Patient is participating in a Managed Medicaid Plan:  No   A/P: Diabetes longstanding currently controlled. Commended her for this!! A1c today is 5.9 (down from 9.8 3 months ago and from 12.3 in Jan of this year). Patient is able to verbalize  appropriate hypoglycemia management plan. She is asymptomatic from a hyper- or a hypoglycemic standpoint. Medication adherence appears appropriate. Will have her continue Ozempic  2mg  weekly and decrease Semglee . -Continue Ozempic  2 mg weekly.  -Decrease Semglee  to 26u daily.  -Patient educated on purpose, proper use, and potential adverse effects of Ozempic .  -Extensively discussed pathophysiology of diabetes, recommended lifestyle interventions, dietary effects on blood sugar control.  -Counseled on s/sx of and management of hypoglycemia.  -Next A1c anticipated 12/2023.  Written patient instructions provided. Patient verbalized understanding of treatment plan.  Total time in face to face counseling 30 minutes.    Follow-up:  Pharmacist in 2 months.  PCP: 10/25/2023.  Herlene Fleeta Morris, PharmD, JAQUELINE, CPP Clinical Pharmacist [facility] [phone]

## 2023-09-22 NOTE — Telephone Encounter (Signed)
 Noted

## 2023-09-22 NOTE — Telephone Encounter (Signed)
 Copied from CRM [id number]. Topic: Appointments - Appointment Scheduling >> Sep 22, 2023 10:32 AM Turkey B wrote:  Pt says is running late, will be there within the 10 min grace period

## 2023-09-27 LAB — HM DIABETES EYE EXAM

## 2023-10-20 ENCOUNTER — Other Ambulatory Visit (HOSPITAL_BASED_OUTPATIENT_CLINIC_OR_DEPARTMENT_OTHER): Payer: Self-pay

## 2023-10-25 ENCOUNTER — Ambulatory Visit: Attending: Internal Medicine | Admitting: Internal Medicine

## 2023-10-25 ENCOUNTER — Other Ambulatory Visit: Payer: Self-pay

## 2023-10-25 ENCOUNTER — Encounter: Payer: Self-pay | Admitting: Internal Medicine

## 2023-10-25 DIAGNOSIS — E1159 Type 2 diabetes mellitus with other circulatory complications: Secondary | ICD-10-CM

## 2023-10-25 DIAGNOSIS — Z7985 Long-term (current) use of injectable non-insulin antidiabetic drugs: Secondary | ICD-10-CM | POA: Diagnosis not present

## 2023-10-25 DIAGNOSIS — E1169 Type 2 diabetes mellitus with other specified complication: Secondary | ICD-10-CM | POA: Diagnosis not present

## 2023-10-25 DIAGNOSIS — Z6838 Body mass index (BMI) 38.0-38.9, adult: Secondary | ICD-10-CM

## 2023-10-25 DIAGNOSIS — Z3009 Encounter for other general counseling and advice on contraception: Secondary | ICD-10-CM

## 2023-10-25 DIAGNOSIS — I10 Essential (primary) hypertension: Secondary | ICD-10-CM

## 2023-10-25 DIAGNOSIS — Z794 Long term (current) use of insulin: Secondary | ICD-10-CM

## 2023-10-25 MED ORDER — IBUPROFEN 800 MG PO TABS
800.0000 mg | ORAL_TABLET | Freq: Three times a day (TID) | ORAL | 0 refills | Status: DC | PRN
  Filled 2023-10-25: qty 30, 10d supply, fill #0

## 2023-10-25 MED ORDER — INSULIN PEN NEEDLE 32G X 4 MM MISC
1.0000 | Freq: Every day | 1 refills | Status: AC
  Filled 2023-10-25: qty 100, 30d supply, fill #0

## 2023-10-25 MED ORDER — BLOOD PRESSURE MONITOR DEVI
0 refills | Status: AC
  Filled 2023-10-25: qty 1, fill #0

## 2023-10-25 NOTE — Progress Notes (Signed)
 Patient ID: Laura Ashley, female    DOB: 1991/03/08  MRN: [id number]  CC: Hypertension (HTN f/u. Thompson referral for a fertility clinic /Discuss meds and possible replacements )   Subjective: Laura Ashley is a 33 y.o. female who presents for chronic ds management. Laura Ashley, her significant other, is with her Her concerns today include:  Patient with history of DM type II microalbumin, HTN, obesity, microalbuminuria, PCOS.   Discussed the use of AI scribe software for clinical note transcription with the patient, who gave verbal consent to proceed.  History of Present Illness Laura Ashley is a 33 year old female with hypertension, diabetes, and PCOS who presents for follow-up and discussion of fertility options.  She has a history of polycystic ovary syndrome (PCOS) but her menstrual cycles have become regular over past several mths. She plans to start trying to conceive after her birthday in November and is considering in vitro fertilization (IVF) as an option. Wants referral to fertility clinic.  HTN: Regarding her hypertension, she is currently taking lisinopril 15 mg, which was increased at her last visit. She sometimes forgets to take her medication, noting that she did not take it last night or this morning. She usually takes it at night but does not have a consistent routine. She does not own a blood pressure monitor but has checked her blood pressure at a store, noting it was elevated at 149 mmHg. She last took her lisinopril two days ago.  DM: Lab Results  Component Value Date   HGBA1C 5.9 09/22/2023  For her diabetes, her last hemoglobin A1c was 5.9% as of July. She is on Ozempic 2 mg and Semglee insulin at 26 units. She checks her blood sugar once a day, usually in the morning, and reports readings in the low 200s. She occasionally indulges in sugary drinks and snacks. Her appetite has decreased since starting Ozempic.    Patient Active Problem List   Diagnosis  Date Noted   PCOS (polycystic ovarian syndrome)    Hyperlipidemia associated with type 2 diabetes mellitus (HCC) 02/21/2021   Uncontrolled type 2 diabetes mellitus with microalbuminuria 10/27/2018   Elevated blood pressure reading 09/25/2018   Class 3 severe obesity due to excess calories with serious comorbidity and body mass index (BMI) of 45.0 to 49.9 in adult 09/25/2018   History of PCOS 09/25/2018   Morbidly obese (HCC) 02/28/2014   Hypertension 02/28/2014   Dysfunctional uterine bleeding 02/28/2014     Current Outpatient Medications on File Prior to Visit  Medication Sig Dispense Refill   atorvastatin (LIPITOR) 10 MG tablet Take 1 tablet (10 mg total) by mouth daily. 90 tablet 1   benzonatate (TESSALON PERLES) 100 MG capsule Take 1 capsule (100 mg total) by mouth 3 (three) times daily as needed. 20 capsule 0   Blood Glucose Monitoring Suppl (ONETOUCH VERIO) w/Device KIT Use as instructed to check blood sugar up to 3 times daily. E11.69 1 kit 0   Continuous Blood Gluc Receiver (FREESTYLE LIBRE 2 READER) DEVI Use to check blood glucose continuously. E11.65 1 each 0   Continuous Glucose Sensor (FREESTYLE LIBRE 2 SENSOR) MISC Use to check blood glucose continuously. Change sensors once every 14 days. E11.65 2 each 3   glucose blood (ONETOUCH VERIO) test strip Use as instructed to check blood sugar up to 3 times daily. E11.69 100 each 3   ibuprofen (ADVIL) 800 MG tablet Take 1 tablet (800 mg total) by mouth every 8 (eight) hours as needed. 30 tablet  0   insulin  glargine-yfgn (SEMGLEE ) 100 UNIT/ML Pen Inject 26 Units into the skin daily. 15 mL 1   Insulin  Pen Needle 32G X 4 MM MISC Using daily. 100 each 1   lisinopril  (ZESTRIL ) 10 MG tablet Take 1.5 tablets (15 mg total) by mouth daily. 135 tablet 1   methocarbamol  (ROBAXIN ) 500 MG tablet Take 1 tablet (500 mg total) by mouth 2 (two) times daily. 20 tablet 0   metroNIDAZOLE  (FLAGYL ) 500 MG tablet Take 1 tablet (500 mg total) by mouth 2 (two)  times daily. 14 tablet 0   metroNIDAZOLE  (METROGEL ) 0.75 % vaginal gel Place 1 Applicatorful vaginally 2 (two) times a week. 70 g 1   OneTouch Delica Lancets 33G MISC Use as instructed to check blood sugar up to 3 times daily. 100 each 6   Semaglutide , 2 MG/DOSE, 8 MG/3ML SOPN Inject 2 mg as directed once a week. 9 mL 1   Current Facility-Administered Medications on File Prior to Visit  Medication Dose Route Frequency Provider Last Rate Last Admin   sodium chloride  0.9 % bolus 1,000 mL  1,000 mL Intravenous Once [place] Jon HERO, PA-C   Stopped at 05/26/22 1620    Allergies  Allergen Reactions   Penicillins Rash    Has patient had a PCN reaction causing immediate rash, facial/tongue/throat swelling, SOB or lightheadedness with hypotension: Yes Has patient had a PCN reaction causing severe rash involving mucus membranes or skin necrosis: Yes Has patient had a PCN reaction that required hospitalization: No Has patient had a PCN reaction occurring within the last 10 years: No If all of the above answers are NO, then may proceed with Cephalosporin use.    Social History   Socioeconomic History   Marital status: Single    Spouse name: Not on file   Number of children: 0   Years of education: Not on file   Highest education level: Associate degree: academic program  Occupational History   Not on file  Tobacco Use   Smoking status: Former   Smokeless tobacco: Never  Vaping Use   Vaping status: Never Used  Substance and Sexual Activity   Alcohol use: Yes    Comment: social   Drug use: No   Sexual activity: Yes    Birth control/protection: None  Other Topics Concern   Not on file  Social History Narrative   Not on file   Social Drivers of Health   Financial Resource Strain: Low Risk  (05/31/2023)   Overall Financial Resource Strain (CARDIA)    Difficulty of Paying Living Expenses: Not hard at all  Food Insecurity: No Food Insecurity (05/31/2023)   Hunger Vital Sign     Worried About Running Out of Food in the Last Year: Never true    Ran Out of Food in the Last Year: Never true  Transportation Needs: No Transportation Needs (05/31/2023)   PRAPARE - Administrator, Civil Service (Medical): No    Lack of Transportation (Non-Medical): No  Physical Activity: Inactive (05/31/2023)   Exercise Vital Sign    Days of Exercise per Week: 0 days    Minutes of Exercise per Session: 0 min  Stress: No Stress Concern Present (05/31/2023)   [employer] of Occupational Health - Occupational Stress Questionnaire    Feeling of Stress : Not at all  Social Connections: Moderately Isolated (05/31/2023)   Social Connection and Isolation Panel    Frequency of Communication with Friends and Family: Twice a week  Frequency of Social Gatherings with Friends and Family: Three times a week    Attends Religious Services: More than 4 times per year    Active Member of Clubs or Organizations: No    Attends Banker Meetings: Never    Marital Status: Never married  Intimate Partner Violence: Not At Risk (05/31/2023)   Humiliation, Afraid, Rape, and Kick questionnaire    Fear of Current or Ex-Partner: No    Emotionally Abused: No    Physically Abused: No    Sexually Abused: No    Family History  Problem Relation Age of Onset   Diabetes Father    Diabetes Other    Healthy Mother     Past Surgical History:  Procedure Laterality Date   NO PAST SURGERIES      ROS: Review of Systems Negative except as stated above  PHYSICAL EXAM: BP 136/88 (BP Location: Left Arm, Patient Position: Sitting, Cuff Size: Normal)   Pulse 77   Temp 98.1 F (36.7 C) (Oral)   Ht 5' 4 (1.626 m)   Wt 227 lb (103 kg)   SpO2 100%   BMI 38.96 kg/m   Wt Readings from Last 3 Encounters:  10/25/23 227 lb (103 kg)  07/21/23 226 lb (102.5 kg)  07/04/23 231 lb (104.8 kg)    Physical Exam  General appearance - alert, well appearing, and in no distress Mental status -  normal mood, behavior, speech, dress, motor activity, and thought processes Chest - clear to auscultation, no wheezes, rales or rhonchi, symmetric air entry Heart - normal rate, regular rhythm, normal S1, S2, no murmurs, rubs, clicks or gallops Extremities - peripheral pulses normal, no pedal edema, no clubbing or cyanosis Diabetic Foot Exam - Simple   Simple Foot Form Diabetic Foot exam was performed with the following findings: Yes 10/25/2023 12:26 PM  Visual Inspection No deformities, no ulcerations, no other skin breakdown bilaterally: Yes Sensation Testing Intact to touch and monofilament testing bilaterally: Yes Pulse Check Posterior Tibialis and Dorsalis pulse intact bilaterally: Yes Comments         Latest Ref Rng & Units 03/17/2023    3:55 PM 08/27/2022   11:49 AM 03/23/2021    4:02 PM  CMP  Glucose 70 - 99 mg/dL 621  818  668   BUN 6 - 20 mg/dL 10  10  10    Creatinine 0.57 - 1.00 mg/dL 9.30  9.37  9.32   Sodium 134 - 144 mmol/L 135  136  131   Potassium 3.5 - 5.2 mmol/L 4.5  4.6  4.3   Chloride 96 - 106 mmol/L 98  99  98   CO2 20 - 29 mmol/L 25  23  24    Calcium  8.7 - 10.2 mg/dL 9.8  9.6  9.5   Total Protein 6.0 - 8.5 g/dL 6.5  7.0  7.8   Total Bilirubin 0.0 - 1.2 mg/dL 0.4  0.4  1.5   Alkaline Phos 44 - 121 IU/L 84  73  87   AST 0 - 40 IU/L 14  16  14    ALT 0 - 32 IU/L 17  18  18     Lipid Panel     Component Value Date/Time   CHOL 197 03/17/2023 1555   TRIG 166 (H) 03/17/2023 1555   HDL 44 03/17/2023 1555   CHOLHDL 4.5 (H) 03/17/2023 1555   LDLCALC 124 (H) 03/17/2023 1555    CBC    Component Value Date/Time   WBC 12.1 (  H) 03/23/2021 1602   RBC 5.18 (H) 03/23/2021 1602   HGB 16.0 (H) 03/23/2021 1602   HGB 15.5 02/20/2021 1524   HCT 44.3 03/23/2021 1602   HCT 44.9 02/20/2021 1524   PLT 373 03/23/2021 1602   PLT 431 02/20/2021 1524   MCV 85.5 03/23/2021 1602   MCV 88 02/20/2021 1524   MCH 30.9 03/23/2021 1602   MCHC 36.1 (H) 03/23/2021 1602   RDW 11.2  (L) 03/23/2021 1602   RDW 11.5 (L) 02/20/2021 1524   LYMPHSABS 1.5 03/23/2021 1602   MONOABS 0.8 03/23/2021 1602   EOSABS 0.0 03/23/2021 1602   BASOSABS 0.0 03/23/2021 1602    Assessment & Plan Preconception counseling and fertility planning in patient with polycystic ovary syndrome (PCOS) PCOS present with regular cycles. Planning IVF post-birthday in November. - Refer to gynecologist for IVF consultation. - Advise tracking menstrual cycle start dates and calculating mid-cycle for optimal conception timing. - Discuss potential need to stop Ozempic  during pregnancy and switch to totally insulin . - Will also need to stop lisinopril  as it is contraindicated in pregnancy.  Given option to stop it now and changed to labetalol.  However patient prefers to hold off until she sees the gynecologist and will stop once they decide to move forward with IVF.  Hypertension management in patient planning pregnancy Hypertension managed with lisinopril , contraindicated in pregnancy. Elevated blood pressure at 149 mmHg systolic. Prefers to wait until IVF plans are finalized before switching medications. - Advise obtaining a blood pressure monitoring device and check blood pressure twice a week. - Goal blood pressure is 130/80 mmHg or lower. - Recommend using a pill box to improve medication adherence.  Type 2 diabetes mellitus associated with morbid obesity (HCC) (Primary) Insulin  long-term use Long-term use of injectable non-insulin  drug Type 2 diabetes mellitus management Type 2 diabetes managed with Ozempic  and Semglee  insulin . Last A1c 5.9%. Fasting morning blood sugars in the low 200s per pt. - Increase Semglee  insulin  to 29 units. - Continue Ozempic  2 mg. - Advise avoiding sugary drinks and snacks. - Monitor blood sugars daily, especially fasting levels.   Patient was given the opportunity to ask questions.  Patient verbalized understanding of the plan and was able to repeat key elements of the  plan.   This documentation was completed using Paediatric nurse.  Any transcriptional errors are unintentional.  No orders of the defined types were placed in this encounter.    Requested Prescriptions   Pending Prescriptions Disp Refills   Insulin  Pen Needle 32G X 4 MM MISC 100 each 1    Sig: Using daily.   ibuprofen  (ADVIL ) 800 MG tablet 30 tablet 0    Sig: Take 1 tablet (800 mg total) by mouth every 8 (eight) hours as needed.    No follow-ups on file.  Barnie Louder, MD, FACP

## 2023-10-27 ENCOUNTER — Ambulatory Visit: Payer: Self-pay | Admitting: Internal Medicine

## 2023-10-27 LAB — MICROALBUMIN / CREATININE URINE RATIO
Creatinine, Urine: 98.7 mg/dL
Microalb/Creat Ratio: 7 mg/g{creat} (ref 0–29)
Microalbumin, Urine: 7.2 ug/mL

## 2023-11-18 ENCOUNTER — Telehealth: Payer: Self-pay | Admitting: Internal Medicine

## 2023-11-18 NOTE — Telephone Encounter (Addendum)
 Patient requested a call back regarding a matter discussed during her last visit.She did not wish to disclose the details but asked specifically for a call back from you.

## 2023-11-18 NOTE — Telephone Encounter (Signed)
 Call returned to patient. She is asking to have her partner establish with one of our clinics. I informed her to have her partner come with her to her appt later this month so they can inquire about possible appts.

## 2023-11-18 NOTE — Telephone Encounter (Signed)
 Copied from CRM [id number]. Topic: Appointments - Scheduling Inquiry for Clinic >> Nov 18, 2023  9:50 AM Emylou G wrote: Reason for CRM: Patient wants a call back from [employer] prefers direct contact in regards to her appt.

## 2023-11-30 NOTE — Progress Notes (Signed)
 S:     No chief complaint on file.  33 y.o. female  who presents for diabetes evaluation, education, and management. PMH is significant for T2DM, HTN, HLD, PCOS. Patient was referred and last seen by Primary Care Provider, Dr. Vicci, on 07/21/2023. Pharmacy last saw her on 09/22/2023. At last visit, Ozempic  was increased to 2 mg and Semglee  was decreased to 26 units. Patient has seen Dr. Vicci since on 10/25/23. Dr. Vicci continued her Ozempic  2 mg and increased Semglee  to 29 units. There was talk about the patient becoming pregnant during that visit.  Patient arrives in good spirits and presents without any assistance. Patient states that she's doing good. She confirms taking her Semglee  and Ozempic . Reports missing a few injections. Once she starts back on them, she experiences acid reflux for about a week. Denies any hypo or hyper symptoms with Semglee . She has been taking 26 units instead of 29 units that Dr. Vicci prescribed.  Home BG readings have been fluctuating between 130-140s. For the most part, BG readings have been steady with FBG this morning at 134.   Patient and partner are in the process of starting IVF. She does not have a timeline on when she will like to get pregnant, but hopefully after November. There are locations around the [place] area, but may go down to [place] for treatment. We discussed pregnancy and diabetic goals to put this on her radar for the future.  Patient is super proud of her progress since seeing [facility]. She wants to continue to keep her diabetes where it's at. She is super motivated to continue her hard work and feels like she would be able to manage during pregnancy.   Family/Social History:  Fhx: Father - diabetes Tobacco: Former Smoker  Current diabetes medications include: Ozempic  2 mg, Semglee  26 units Current hypertension medications include: lisinopril  10 mg Current hyperlipidemia medications include: atorvastatin  10 mg  Patient denies  adherence with medications, reports missing a few medications 2 times per week, on average.   Follow up 6-8 weeks  Insurance coverage: BCBS  Patient denies hypoglycemic events.  Reported home fasting blood sugars: 134 mg/dL with avg [phone]  Patient denies nocturia (nighttime urination).  Patient denies neuropathy (nerve pain). Patient denies visual changes. Patient reports self foot exams.   Patient reported dietary habits: Eats 2 meals/day Breakfast: egg, sauasage, toast Lunch: salad with caesar, chicken Dinner: fish, loves greens Snacks: peaches, strawberries Drinks: water, juice and soda occasionally   Patient-reported exercise habits: walking at work, occasionally go to park  O:   ROS  Physical Exam   Lab Results  Component Value Date   HGBA1C 5.9 09/22/2023   There were no vitals filed for this visit.  Lipid Panel     Component Value Date/Time   CHOL 197 03/17/2023 1555   TRIG 166 (H) 03/17/2023 1555   HDL 44 03/17/2023 1555   CHOLHDL 4.5 (H) 03/17/2023 1555   LDLCALC 124 (H) 03/17/2023 1555    A/P: Diabetes longstanding is currently at goal with A1c 5.9%. Commended her for her hard work! She has been seeing Her[facility] since 2020 and has made tremendous progress. She is very proud herself and wants to continue to feel good about herself and her DM. Patient is unsymptomatic at this time but is able to verbalize appropriate hypoglycemia management plan. Medication adherence appears optimal. -Increased dose of basal insulin  Semglee  (insulin  glargine) from 26 units to 30 units daily in the morning. Will start to move  towards tighter blood sugar control in preparation for future pregnancy. No recent hypoglycemia events so feel hopefully that this dose will be sufficient. Patient instructed to contact pharmacy team if hypoglycemia events occur. -Continued GLP-1 Ozempic  (semaglutide ) 2 mg once weekly. Instructed patient to start taking on the weekends to avoid missing  any future injections. Will send a prescription for pantoprazole  to help take for acid reflux. -Patient educated on purpose, proper use, and potential adverse effects of Ozempic .  -Extensively discussed pathophysiology of diabetes, recommended lifestyle interventions, dietary effects on blood sugar control.  -Counseled on s/sx of and management of hypoglycemia.  -Next A1c anticipated October 2025.   Written patient instructions provided. Patient verbalized understanding of treatment plan.  Total time in face to face counseling 30 minutes.    Follow-up:  Pharmacist on 01/05/24 PCP clinic visit on 03/01/24 with Dr. Vicci Jenkins Graces, PharmD PGY1 Pharmacy Resident [phone]

## 2023-12-01 ENCOUNTER — Other Ambulatory Visit: Payer: Self-pay

## 2023-12-01 ENCOUNTER — Encounter: Payer: Self-pay | Admitting: Pharmacist

## 2023-12-01 ENCOUNTER — Ambulatory Visit: Attending: Internal Medicine | Admitting: Pharmacist

## 2023-12-01 DIAGNOSIS — Z7985 Long-term (current) use of injectable non-insulin antidiabetic drugs: Secondary | ICD-10-CM | POA: Diagnosis not present

## 2023-12-01 DIAGNOSIS — Z794 Long term (current) use of insulin: Secondary | ICD-10-CM

## 2023-12-01 DIAGNOSIS — E1169 Type 2 diabetes mellitus with other specified complication: Secondary | ICD-10-CM

## 2023-12-01 MED ORDER — SEMAGLUTIDE (2 MG/DOSE) 8 MG/3ML ~~LOC~~ SOPN
2.0000 mg | PEN_INJECTOR | SUBCUTANEOUS | 1 refills | Status: DC
  Filled 2023-12-01 – 2023-12-13 (×2): qty 3, 28d supply, fill #0

## 2023-12-01 MED ORDER — PANTOPRAZOLE SODIUM 20 MG PO TBEC
20.0000 mg | DELAYED_RELEASE_TABLET | Freq: Every day | ORAL | 0 refills | Status: AC
  Filled 2023-12-01 – 2023-12-13 (×2): qty 30, 30d supply, fill #0

## 2023-12-01 MED ORDER — INSULIN GLARGINE-YFGN 100 UNIT/ML ~~LOC~~ SOPN
30.0000 [IU] | PEN_INJECTOR | Freq: Every day | SUBCUTANEOUS | 1 refills | Status: DC
  Filled 2023-12-01 – 2023-12-13 (×2): qty 9, 30d supply, fill #0

## 2023-12-08 ENCOUNTER — Other Ambulatory Visit: Payer: Self-pay

## 2023-12-12 ENCOUNTER — Other Ambulatory Visit: Payer: Self-pay

## 2023-12-13 ENCOUNTER — Other Ambulatory Visit: Payer: Self-pay

## 2024-01-05 ENCOUNTER — Ambulatory Visit: Attending: Internal Medicine | Admitting: Pharmacist

## 2024-01-05 ENCOUNTER — Encounter: Payer: Self-pay | Admitting: Pharmacist

## 2024-01-05 DIAGNOSIS — Z7985 Long-term (current) use of injectable non-insulin antidiabetic drugs: Secondary | ICD-10-CM

## 2024-01-05 DIAGNOSIS — Z794 Long term (current) use of insulin: Secondary | ICD-10-CM

## 2024-01-05 DIAGNOSIS — E1169 Type 2 diabetes mellitus with other specified complication: Secondary | ICD-10-CM | POA: Diagnosis not present

## 2024-01-05 LAB — POCT GLYCOSYLATED HEMOGLOBIN (HGB A1C): HbA1c, POC (controlled diabetic range): 5.3 % (ref 0.0–7.0)

## 2024-01-05 NOTE — Progress Notes (Signed)
    S:     No chief complaint on file.  33 y.o. female  who presents for diabetes evaluation, education, and management. PMH is significant for T2DM, HTN, HLD, PCOS. Patient was referred and last seen by Primary Care Provider, Dr. Vicci, on 10/25/2023.  Pharmacy last saw her on 12/01/2023. At last visit, Ozempic  was continued at 2 mg and Semglee  was increased 30 units. She informed us  at that time that her and her partner were beginning the process for IVF. We discussed DM and pregnancy and the need for stringent control should pregnancy occur.   Today, patient arrives in good spirits and presents with her partner. Patient states that she's doing well. She confirms taking her Semglee  and Ozempic . Denies any hypo- or hyperglycemia-associated symptoms.  Home BG range is between 120s-170s. For the most part, BG readings have been steadily in the 120s-130s.  Family/Social History:  Fhx: Father - diabetes Tobacco: Former Smoker  Current diabetes medications include: Ozempic  2 mg, Semglee  30 units (takes 26 units daily) Current hypertension medications include: lisinopril  10 mg Current hyperlipidemia medications include: atorvastatin  10 mg Patient reports adherence with medications.  Insurance coverage: BCBS  Patient denies hypoglycemic events.  Patient denies nocturia (nighttime urination).  Patient denies neuropathy (nerve pain). Patient denies visual changes. Patient reports self foot exams.   Patient reported dietary habits: Eats 2 meals/day Breakfast: egg, sauasage, toast Lunch: salad with caesar, chicken Dinner: fish, loves greens Snacks: peaches, strawberries Drinks: water, juice and soda occasionally  Patient-reported exercise habits: walking at work, occasionally go to park  O:   ROS  Physical Exam   Lab Results  Component Value Date   HGBA1C 5.3 01/05/2024   There were no vitals filed for this visit.  Lipid Panel     Component Value Date/Time   CHOL 197  03/17/2023 1555   TRIG 166 (H) 03/17/2023 1555   HDL 44 03/17/2023 1555   CHOLHDL 4.5 (H) 03/17/2023 1555   LDLCALC 124 (H) 03/17/2023 1555    A/P: Diabetes longstanding is currently at goal with A1c 5.3% today. Commended her for her hard work! Also relayed to her that her A1c is exemplary. Patient is not symptomatic at this time but is able to verbalize appropriate hypoglycemia management plan. Medication adherence appears optimal. -Continue Semglee  (insulin  glargine) 26 units daily. Advised her to decrease to 20 units daily if hypoglycemic occurs.  -Continued GLP-1 Ozempic  (semaglutide ) 2 mg once weekly. I -Patient educated on purpose, proper use, and potential adverse effects of Ozempic , insulin   -Extensively discussed pathophysiology of diabetes, recommended lifestyle interventions, dietary effects on blood sugar control.  -Counseled on s/sx of and management of hypoglycemia.  -Next A1c anticipated 03/2024 - 06/2024.   Written patient instructions provided. Patient verbalized understanding of treatment plan.  Total time in face to face counseling 30 minutes.    Follow-up:  Pharmacist once every 3 months PCP clinic visit on 03/01/24 with Dr. Vicci Herlene Fleeta Tonia, PharmD, BCACP, CPP Clinical Pharmacist [facility] [phone]

## 2024-03-01 ENCOUNTER — Ambulatory Visit: Admitting: Internal Medicine

## 2024-03-01 ENCOUNTER — Ambulatory Visit: Attending: Internal Medicine | Admitting: Internal Medicine

## 2024-03-01 DIAGNOSIS — E785 Hyperlipidemia, unspecified: Secondary | ICD-10-CM

## 2024-03-01 DIAGNOSIS — Z794 Long term (current) use of insulin: Secondary | ICD-10-CM

## 2024-03-01 DIAGNOSIS — I10 Essential (primary) hypertension: Secondary | ICD-10-CM

## 2024-03-01 DIAGNOSIS — E1169 Type 2 diabetes mellitus with other specified complication: Secondary | ICD-10-CM

## 2024-03-01 DIAGNOSIS — Z6836 Body mass index (BMI) 36.0-36.9, adult: Secondary | ICD-10-CM

## 2024-03-01 DIAGNOSIS — Z23 Encounter for immunization: Secondary | ICD-10-CM

## 2024-03-01 DIAGNOSIS — I152 Hypertension secondary to endocrine disorders: Secondary | ICD-10-CM

## 2024-03-01 DIAGNOSIS — Z7985 Long-term (current) use of injectable non-insulin antidiabetic drugs: Secondary | ICD-10-CM | POA: Diagnosis not present

## 2024-03-01 NOTE — Progress Notes (Signed)
 Patient ID: Laura Ashley, female   DOB: [birth year], 34 y.o.   MRN: [id number] Telephone visit: this was suppose to be a video visit but we could not connect. I called pt and advise we can do telephone visit but likely will not be covered by her insurance. Given option to proceed vs rescheduling as in office visit. Pt agreed to do telephone visit.  I connected with Laura Ashley on 03/01/2024 at 12:38 p.m by phone and verified that I am speaking with the correct person using two identifiers.  Location: Patient: parked car Provider: Office   I discussed the limitations of evaluation and management by telemedicine and the availability of in person appointments. The patient expressed understanding and agreed to proceed.  History of Present Illness: Patient with history of DM type II microalbumin, HTN, obesity, microalbuminuria, PCOS.    Discussed the use of AI scribe software for clinical note transcription with the patient, who gave verbal consent to proceed.  History of Present Illness Laura Ashley is a 33 year old female with diabetes and hypertension who presents for follow-up.  She is currently on lisinopril  15 mg daily for hypertension. Her blood pressure was last checked at a store on Sunday, and was 124/82 mmHg. She tries to limit salt intake and checks her blood pressure weekly when possible.  DM/obesity: For diabetes management, she is on Ozempic  2 mg once a week and Semglee  insulin  20 units daily. Her most recent A1c was 5.3% as of October 23rd, 2025. She reports eating smaller portions due to decreased appetite from Ozempic  and engages in regular physical activity, including walking at work and doing sit-ups at home. Her weight has decreased from 227 pounds in August to between 213 and 215 pounds currently. She checks her blood sugar by pricking her finger once or twice daily, with morning readings between 125 and 130 mg/dL and evening readings around 150 mg/dL.  HL: She has  not been taking atorvastatin  for cholesterol management, despite previous elevated LDL levels. Said she was not taking because she was focus on DM and BP. Her last cholesterol check showed an LDL of 124 mg/dL, with a goal of less than 70 mg/dL.  HM: She has not yet received her flu shot for the autumn but still plans to get it.    Outpatient Encounter Medications as of 03/01/2024  Medication Sig   atorvastatin  (LIPITOR) 10 MG tablet Take 1 tablet (10 mg total) by mouth daily.   benzonatate  (TESSALON  PERLES) 100 MG capsule Take 1 capsule (100 mg total) by mouth 3 (three) times daily as needed.   Blood Glucose Monitoring Suppl (ONETOUCH VERIO) w/Device KIT Use as instructed to check blood sugar up to 3 times daily. E11.69   Blood Pressure Monitor DEVI Use as directed to check home blood pressure 2-3 times a week   Continuous Blood Gluc Receiver (FREESTYLE LIBRE 2 READER) DEVI Use to check blood glucose continuously. E11.65   Continuous Glucose Sensor (FREESTYLE LIBRE 2 SENSOR) MISC Use to check blood glucose continuously. Change sensors once every 14 days. E11.65   glucose blood (ONETOUCH VERIO) test strip Use as instructed to check blood sugar up to 3 times daily. E11.69   ibuprofen  (ADVIL ) 800 MG tablet Take 1 tablet (800 mg total) by mouth every 8 (eight) hours as needed.   insulin  glargine-yfgn (SEMGLEE ) 100 UNIT/ML Pen Inject 30 Units into the skin daily.   Insulin  Pen Needle 32G X 4 MM MISC Using daily.   lisinopril  (ZESTRIL ) 10 MG  tablet Take 1.5 tablets (15 mg total) by mouth daily.   methocarbamol  (ROBAXIN ) 500 MG tablet Take 1 tablet (500 mg total) by mouth 2 (two) times daily.   metroNIDAZOLE  (FLAGYL ) 500 MG tablet Take 1 tablet (500 mg total) by mouth 2 (two) times daily.   metroNIDAZOLE  (METROGEL ) 0.75 % vaginal gel Place 1 Applicatorful vaginally 2 (two) times a week.   OneTouch Delica Lancets 33G MISC Use as instructed to check blood sugar up to 3 times daily.   pantoprazole   (PROTONIX ) 20 MG tablet Take 1 tablet (20 mg total) by mouth daily.   Semaglutide , 2 MG/DOSE, 8 MG/3ML SOPN Inject 2 mg as directed once a week.   Facility-Administered Encounter Medications as of 03/01/2024  Medication   sodium chloride  0.9 % bolus 1,000 mL      Observations/Objective: Today's Vitals   03/01/24 1303  BP: 124/82  Weight: 215 lb (97.5 kg)  Height: 5' 4 (1.626 m)   Body mass index is 36.9 kg/m.   Assessment and Plan: 1. Type 2 diabetes mellitus associated with morbid obesity (HCC) (Primary) Blood sugars and most recent A1c at goal.  She also has achieved some weight loss on the Ozempic .  We will continue the Ozempic  2 mg once a week and Semglee  insulin  20 units daily.  Encouraged her to continue healthy eating habits and regular exercise.  Would be mindful of portion sizes and avoid excessive sweets snacks during the holiday season.  She is agreeable to coming fasting to the lab to have blood test done - CBC; Future - Comprehensive metabolic panel with GFR; Future - Lipid panel; Future  2. Insulin  long-term use (HCC) 3. Long-term (current) use of injectable non-insulin  antidiabetic drugs See #1 above.  4. Hypertension associated with type 2 diabetes mellitus (HCC) Most recent blood pressure close to goal.  Continue lisinopril  15 mg daily. 5. Hyperlipidemia associated with type 2 diabetes mellitus (HCC) Encouraged her to take the atorvastatin .  Went over last lipid profile with her and she was not at goal.  Statin therapy also helps to decrease cardiovascular risk in patients with diabetes.  6. Need for influenza vaccination Encouraged to get her flu vaccine from any outside pharmacy.  Send me a MyChart message once she gets it so that we can update her health maintenance.   Follow Up Instructions: F/u in 4 mths   I discussed the assessment and treatment plan with the patient. The patient was provided an opportunity to ask questions and all were answered. The  patient agreed with the plan and demonstrated an understanding of the instructions.   The patient was advised to call back or seek an in-person evaluation if the symptoms worsen or if the condition fails to improve as anticipated.  I spent 10 minutes dedicated to the care of this patient on the date of this encounter   This note has been created with Education officer, environmental. Any transcriptional errors are unintentional.  Barnie Louder, MD

## 2024-03-01 NOTE — Progress Notes (Deleted)
 Patient ID: Laura Ashley, female    DOB: [birth year]  MRN: [id number]  CC: No chief complaint on file.   Subjective: Laura Ashley is a 33 y.o. female who presents for chronic ds management. Her concerns today include: ***  Discussed the use of AI scribe software for clinical note transcription with the patient, who gave verbal consent to proceed.  History of Present Illness     Patient Active Problem List   Diagnosis Date Noted   PCOS (polycystic ovarian syndrome)    Hyperlipidemia associated with type 2 diabetes mellitus (HCC) 02/21/2021   Uncontrolled type 2 diabetes mellitus with microalbuminuria 10/27/2018   Elevated blood pressure reading 09/25/2018   Class 3 severe obesity due to excess calories with serious comorbidity and body mass index (BMI) of 45.0 to 49.9 in adult [facility]) 09/25/2018   History of PCOS 09/25/2018   Morbidly obese (HCC) 02/28/2014   Hypertension 02/28/2014   Dysfunctional uterine bleeding 02/28/2014     Medications Ordered Prior to Encounter[1]  Allergies[2]  Social History   Socioeconomic History   Marital status: Single    Spouse name: Not on file   Number of children: 0   Years of education: Not on file   Highest education level: Associate degree: academic program  Occupational History   Not on file  Tobacco Use   Smoking status: Former   Smokeless tobacco: Never  Vaping Use   Vaping status: Never Used  Substance and Sexual Activity   Alcohol use: Yes    Comment: social   Drug use: No   Sexual activity: Yes    Birth control/protection: None  Other Topics Concern   Not on file  Social History Narrative   Not on file   Social Drivers of Health   Tobacco Use: Medium Risk (01/05/2024)   Patient History    Smoking Tobacco Use: Former    Smokeless Tobacco Use: Never    Passive Exposure: Not on Actuary Strain: Low Risk (05/31/2023)   Overall Financial Resource Strain (CARDIA)    Difficulty of Paying  Living Expenses: Not hard at all  Food Insecurity: No Food Insecurity (05/31/2023)   Hunger Vital Sign    Worried About Running Out of Food in the Last Year: Never true    Ran Out of Food in the Last Year: Never true  Transportation Needs: No Transportation Needs (05/31/2023)   PRAPARE - Administrator, Civil Service (Medical): No    Lack of Transportation (Non-Medical): No  Physical Activity: Inactive (05/31/2023)   Exercise Vital Sign    Days of Exercise per Week: 0 days    Minutes of Exercise per Session: 0 min  Stress: No Stress Concern Present (05/31/2023)   [employer] of Occupational Health - Occupational Stress Questionnaire    Feeling of Stress : Not at all  Social Connections: Moderately Isolated (05/31/2023)   Social Connection and Isolation Panel    Frequency of Communication with Friends and Family: Twice a week    Frequency of Social Gatherings with Friends and Family: Three times a week    Attends Religious Services: More than 4 times per year    Active Member of Clubs or Organizations: No    Attends Banker Meetings: Never    Marital Status: Never married  Intimate Partner Violence: Not At Risk (05/31/2023)   Humiliation, Afraid, Rape, and Kick questionnaire    Fear of Current or Ex-Partner: No    Emotionally Abused: No  Physically Abused: No    Sexually Abused: No  Depression (PHQ2-9): Low Risk (10/25/2023)   Depression (PHQ2-9)    PHQ-2 Score: 3  Alcohol Screen: Low Risk (05/31/2023)   Alcohol Screen    Last Alcohol Screening Score (AUDIT): 3  Housing: Low Risk (05/31/2023)   Housing Stability Vital Sign    Unable to Pay for Housing in the Last Year: No    Number of Times Moved in the Last Year: 0    Homeless in the Last Year: No  Utilities: Not At Risk (05/31/2023)   AHC Utilities    Threatened with loss of utilities: No  Health Literacy: Adequate Health Literacy (05/31/2023)   B1300 Health Literacy    Frequency of need for help  with medical instructions: Never    Family History  Problem Relation Age of Onset   Diabetes Father    Diabetes Other    Healthy Mother     Past Surgical History:  Procedure Laterality Date   NO PAST SURGERIES      ROS: Review of Systems Negative except as stated above  PHYSICAL EXAM: There were no vitals taken for this visit.  Physical Exam  {female adult master:310786} {female adult master:310785}     Latest Ref Rng & Units 03/17/2023    3:55 PM 08/27/2022   11:49 AM 03/23/2021    4:02 PM  CMP  Glucose 70 - 99 mg/dL 621  818  668   BUN 6 - 20 mg/dL 10  10  10    Creatinine 0.57 - 1.00 mg/dL 9.30  9.37  9.32   Sodium 134 - 144 mmol/L 135  136  131   Potassium 3.5 - 5.2 mmol/L 4.5  4.6  4.3   Chloride 96 - 106 mmol/L 98  99  98   CO2 20 - 29 mmol/L 25  23  24    Calcium  8.7 - 10.2 mg/dL 9.8  9.6  9.5   Total Protein 6.0 - 8.5 g/dL 6.5  7.0  7.8   Total Bilirubin 0.0 - 1.2 mg/dL 0.4  0.4  1.5   Alkaline Phos 44 - 121 IU/L 84  73  87   AST 0 - 40 IU/L 14  16  14    ALT 0 - 32 IU/L 17  18  18     Lipid Panel     Component Value Date/Time   CHOL 197 03/17/2023 1555   TRIG 166 (H) 03/17/2023 1555   HDL 44 03/17/2023 1555   CHOLHDL 4.5 (H) 03/17/2023 1555   LDLCALC 124 (H) 03/17/2023 1555    CBC    Component Value Date/Time   WBC 12.1 (H) 03/23/2021 1602   RBC 5.18 (H) 03/23/2021 1602   HGB 16.0 (H) 03/23/2021 1602   HGB 15.5 02/20/2021 1524   HCT 44.3 03/23/2021 1602   HCT 44.9 02/20/2021 1524   PLT 373 03/23/2021 1602   PLT 431 02/20/2021 1524   MCV 85.5 03/23/2021 1602   MCV 88 02/20/2021 1524   MCH 30.9 03/23/2021 1602   MCHC 36.1 (H) 03/23/2021 1602   RDW 11.2 (L) 03/23/2021 1602   RDW 11.5 (L) 02/20/2021 1524   LYMPHSABS 1.5 03/23/2021 1602   MONOABS 0.8 03/23/2021 1602   EOSABS 0.0 03/23/2021 1602   BASOSABS 0.0 03/23/2021 1602    ASSESSMENT AND PLAN:  Assessment and Plan Assessment & Plan      There are no diagnoses linked to this  encounter.   Patient was given the opportunity to ask  questions.  Patient verbalized understanding of the plan and was able to repeat key elements of the plan.   This documentation was completed using Paediatric nurse.  Any transcriptional errors are unintentional.  No orders of the defined types were placed in this encounter.    Requested Prescriptions    No prescriptions requested or ordered in this encounter    No follow-ups on file.  Barnie Louder, MD, FACP    [1]  Current Outpatient Medications on File Prior to Visit  Medication Sig Dispense Refill   atorvastatin  (LIPITOR) 10 MG tablet Take 1 tablet (10 mg total) by mouth daily. 90 tablet 1   benzonatate  (TESSALON  PERLES) 100 MG capsule Take 1 capsule (100 mg total) by mouth 3 (three) times daily as needed. 20 capsule 0   Blood Glucose Monitoring Suppl (ONETOUCH VERIO) w/Device KIT Use as instructed to check blood sugar up to 3 times daily. E11.69 1 kit 0   Blood Pressure Monitor DEVI Use as directed to check home blood pressure 2-3 times a week 1 each 0   Continuous Blood Gluc Receiver (FREESTYLE LIBRE 2 READER) DEVI Use to check blood glucose continuously. E11.65 1 each 0   Continuous Glucose Sensor (FREESTYLE LIBRE 2 SENSOR) MISC Use to check blood glucose continuously. Change sensors once every 14 days. E11.65 2 each 3   glucose blood (ONETOUCH VERIO) test strip Use as instructed to check blood sugar up to 3 times daily. E11.69 100 each 3   ibuprofen  (ADVIL ) 800 MG tablet Take 1 tablet (800 mg total) by mouth every 8 (eight) hours as needed. 30 tablet 0   insulin  glargine-yfgn (SEMGLEE ) 100 UNIT/ML Pen Inject 30 Units into the skin daily. 15 mL 1   Insulin  Pen Needle 32G X 4 MM MISC Using daily. 100 each 1   lisinopril  (ZESTRIL ) 10 MG tablet Take 1.5 tablets (15 mg total) by mouth daily. 135 tablet 1   methocarbamol  (ROBAXIN ) 500 MG tablet Take 1 tablet (500 mg total) by mouth 2 (two) times daily. 20  tablet 0   metroNIDAZOLE  (FLAGYL ) 500 MG tablet Take 1 tablet (500 mg total) by mouth 2 (two) times daily. 14 tablet 0   metroNIDAZOLE  (METROGEL ) 0.75 % vaginal gel Place 1 Applicatorful vaginally 2 (two) times a week. 70 g 1   OneTouch Delica Lancets 33G MISC Use as instructed to check blood sugar up to 3 times daily. 100 each 6   pantoprazole  (PROTONIX ) 20 MG tablet Take 1 tablet (20 mg total) by mouth daily. 90 tablet 0   Semaglutide , 2 MG/DOSE, 8 MG/3ML SOPN Inject 2 mg as directed once a week. 9 mL 1   Current Facility-Administered Medications on File Prior to Visit  Medication Dose Route Frequency Provider Last Rate Last Admin   sodium chloride  0.9 % bolus 1,000 mL  1,000 mL Intravenous Once [place] Jon HERO, PA-C   Stopped at 05/26/22 1620  [2]  Allergies Allergen Reactions   Penicillins Rash    Has patient had a PCN reaction causing immediate rash, facial/tongue/throat swelling, SOB or lightheadedness with hypotension: Yes Has patient had a PCN reaction causing severe rash involving mucus membranes or skin necrosis: Yes Has patient had a PCN reaction that required hospitalization: No Has patient had a PCN reaction occurring within the last 10 years: No If all of the above answers are NO, then may proceed with Cephalosporin use.

## 2024-04-12 ENCOUNTER — Encounter: Payer: Self-pay | Admitting: Pharmacist

## 2024-04-12 ENCOUNTER — Ambulatory Visit: Attending: Internal Medicine | Admitting: Pharmacist

## 2024-04-12 ENCOUNTER — Other Ambulatory Visit: Payer: Self-pay

## 2024-04-12 ENCOUNTER — Ambulatory Visit: Admitting: Pharmacist

## 2024-04-12 DIAGNOSIS — Z7985 Long-term (current) use of injectable non-insulin antidiabetic drugs: Secondary | ICD-10-CM

## 2024-04-12 DIAGNOSIS — E1169 Type 2 diabetes mellitus with other specified complication: Secondary | ICD-10-CM

## 2024-04-12 DIAGNOSIS — Z794 Long term (current) use of insulin: Secondary | ICD-10-CM

## 2024-04-12 LAB — POCT GLYCOSYLATED HEMOGLOBIN (HGB A1C): HbA1c, POC (controlled diabetic range): 5.4 % (ref 0.0–7.0)

## 2024-04-12 MED ORDER — SEMAGLUTIDE (1 MG/DOSE) 4 MG/3ML ~~LOC~~ SOPN
1.0000 mg | PEN_INJECTOR | SUBCUTANEOUS | 1 refills | Status: AC
  Filled 2024-04-12: qty 3, 28d supply, fill #0

## 2024-04-12 MED ORDER — LANTUS SOLOSTAR 100 UNIT/ML ~~LOC~~ SOPN
20.0000 [IU] | PEN_INJECTOR | Freq: Every day | SUBCUTANEOUS | 1 refills | Status: AC
  Filled 2024-04-12: qty 6, 30d supply, fill #0

## 2024-04-12 NOTE — Progress Notes (Signed)
" ° ° °  S:     No chief complaint on file.  34 y.o. female  who presents for diabetes evaluation, education, and management. PMH is significant for T2DM, HTN, HLD, PCOS. Patient was referred and last seen by Primary Care Provider, Dr. Vicci, on 03/01/2024.  Pharmacy last saw her on 01/05/2024. A1c at that visit was 5.3%.  Today, patient arrives in good spirits and presents with her partner. Patient states that she's doing well. She confirms taking her Semglee  and Ozempic . Denies any hypo- or hyperglycemia-associated symptoms. She does endorse eructation with the 2 mg Ozempic  dose that was not happening with the lower doses. Describes burping with sulfuric taste/smell. Her A1c today is 5.4%.Of note, she is no longer taking lisinopril . Informs me today that she last took this ~3 months ago.  Home BG range is between 120s-170s. For the most part, BG readings have been steadily in the 120s-130s.  Family/Social History:  Fhx: Father - diabetes Tobacco: Former Smoker  Current diabetes medications include: Ozempic  2 mg, Semglee  20 units Current hypertension medications include: lisinopril  10 mg (not taking) Current hyperlipidemia medications include: atorvastatin  10 mg Patient reports adherence with medications.  Insurance coverage: [employer]  Patient denies hypoglycemic events.  Patient denies nocturia (nighttime urination).  Patient denies neuropathy (nerve pain). Patient denies visual changes. Patient reports self foot exams.   Patient reported dietary habits: Eats 2 meals/day Breakfast: egg, sauasage, toast Lunch: salad with caesar, chicken Dinner: fish, loves greens Snacks: peaches, strawberries Drinks: water, juice and soda occasionally  Patient-reported exercise habits: walking at work, occasionally go to park  O:   ROS  Physical Exam   Lab Results  Component Value Date   HGBA1C 5.4 04/12/2024   There were no vitals filed for this visit.  Lipid Panel     Component  Value Date/Time   CHOL 197 03/17/2023 1555   TRIG 166 (H) 03/17/2023 1555   HDL 44 03/17/2023 1555   CHOLHDL 4.5 (H) 03/17/2023 1555   LDLCALC 124 (H) 03/17/2023 1555    A/P: Diabetes longstanding is currently at goal with A1c 5.4% today. Commended her for her hard work! Patient is not symptomatic at this time but is able to verbalize appropriate hypoglycemia management plan. Medication adherence appears optimal but she is experiencing GI side effects with the Ozempic  2 mg dose. Will decrease this to 1 mg weekly today. -Change Semglee  to Lantus  for insurance preference. Pt to continue Lantus  20 units daily. -DECREASE Ozempic  (semaglutide ) to 1 mg once weekly. -Patient educated on purpose, proper use, and potential adverse effects of Ozempic , insulin   -Extensively discussed pathophysiology of diabetes, recommended lifestyle interventions, dietary effects on blood sugar control.  -Counseled on s/sx of and management of hypoglycemia.  -Next A1c anticipated 06/2024 -Labs per PCP.   Hypertension: longstanding currently at goal off of medications. BP goal is <130/80 mmHg and she has not taken lisinopril  in ~3 months.  -Advised that she can continue without BP medication at this time. We will continue to monitor. Patient may need BP management in the future if BP increases off of medication.  Written patient instructions provided. Patient verbalized understanding of treatment plan.  Total time in face to face counseling 30 minutes.    Follow-up:  Pharmacist once every 3 months  Herlene Fleeta Morris, PharmD, [place], CPP Clinical Pharmacist [facility] [phone]  "

## 2024-04-13 ENCOUNTER — Ambulatory Visit: Payer: Self-pay | Admitting: Internal Medicine

## 2024-04-13 LAB — COMPREHENSIVE METABOLIC PANEL WITH GFR
ALT: 19 [IU]/L (ref 0–32)
AST: 15 [IU]/L (ref 0–40)
Albumin: 4.3 g/dL (ref 3.9–4.9)
Alkaline Phosphatase: 60 [IU]/L (ref 41–116)
BUN/Creatinine Ratio: 18 (ref 9–23)
BUN: 11 mg/dL (ref 6–20)
Bilirubin Total: 0.5 mg/dL (ref 0.0–1.2)
CO2: 25 mmol/L (ref 20–29)
Calcium: 9.5 mg/dL (ref 8.7–10.2)
Chloride: 100 mmol/L (ref 96–106)
Creatinine, Ser: 0.61 mg/dL (ref 0.57–1.00)
Globulin, Total: 2.6 g/dL (ref 1.5–4.5)
Glucose: 95 mg/dL (ref 70–99)
Potassium: 4.7 mmol/L (ref 3.5–5.2)
Sodium: 139 mmol/L (ref 134–144)
Total Protein: 6.9 g/dL (ref 6.0–8.5)
eGFR: 122 mL/min/{1.73_m2}

## 2024-04-13 LAB — CBC
Hematocrit: 41.4 % (ref 34.0–46.6)
Hemoglobin: 13.7 g/dL (ref 11.1–15.9)
MCH: 31.3 pg (ref 26.6–33.0)
MCHC: 33.1 g/dL (ref 31.5–35.7)
MCV: 95 fL (ref 79–97)
Platelets: 384 10*3/uL (ref 150–450)
RBC: 4.38 x10E6/uL (ref 3.77–5.28)
RDW: 12.2 % (ref 11.7–15.4)
WBC: 5.9 10*3/uL (ref 3.4–10.8)

## 2024-04-13 LAB — LIPID PANEL
Chol/HDL Ratio: 2.4 ratio (ref 0.0–4.4)
Cholesterol, Total: 173 mg/dL (ref 100–199)
HDL: 73 mg/dL
LDL Chol Calc (NIH): 89 mg/dL (ref 0–99)
Triglycerides: 55 mg/dL (ref 0–149)
VLDL Cholesterol Cal: 11 mg/dL (ref 5–40)

## 2024-07-12 ENCOUNTER — Ambulatory Visit: Payer: Self-pay | Admitting: Pharmacist
